# Patient Record
Sex: Female | Born: 1970 | Race: White | Hispanic: No | Smoking: Former smoker
Health system: Southern US, Community
[De-identification: ages and names within clinical notes are randomized; demographics above are authoritative.]

## PROBLEM LIST (undated history)

## (undated) DIAGNOSIS — F32A Depression, unspecified: Secondary | ICD-10-CM

## (undated) DIAGNOSIS — D649 Anemia, unspecified: Secondary | ICD-10-CM

## (undated) DIAGNOSIS — F329 Major depressive disorder, single episode, unspecified: Secondary | ICD-10-CM

## (undated) DIAGNOSIS — M1990 Unspecified osteoarthritis, unspecified site: Secondary | ICD-10-CM

## (undated) DIAGNOSIS — I10 Essential (primary) hypertension: Secondary | ICD-10-CM

## (undated) DIAGNOSIS — I4892 Unspecified atrial flutter: Secondary | ICD-10-CM

## (undated) DIAGNOSIS — F410 Panic disorder [episodic paroxysmal anxiety] without agoraphobia: Secondary | ICD-10-CM

## (undated) HISTORY — PX: NO PAST SURGERIES: SHX2092

---

## 2009-11-20 ENCOUNTER — Emergency Department (HOSPITAL_COMMUNITY): Admission: EM | Admit: 2009-11-20 | Discharge: 2009-11-20 | Payer: Self-pay | Admitting: Emergency Medicine

## 2010-02-18 ENCOUNTER — Emergency Department (HOSPITAL_COMMUNITY): Admission: EM | Admit: 2010-02-18 | Discharge: 2010-02-18 | Payer: Self-pay | Admitting: Emergency Medicine

## 2010-12-13 LAB — POCT PREGNANCY, URINE: Preg Test, Ur: NEGATIVE

## 2014-02-16 ENCOUNTER — Emergency Department (HOSPITAL_COMMUNITY)
Admission: EM | Admit: 2014-02-16 | Discharge: 2014-02-17 | Disposition: A | Discharge status: Home / Self Care | Payer: 59 | Attending: Emergency Medicine | Admitting: Emergency Medicine

## 2014-02-16 ENCOUNTER — Encounter (HOSPITAL_COMMUNITY): Payer: Self-pay | Admitting: Emergency Medicine

## 2014-02-16 DIAGNOSIS — Z79899 Other long term (current) drug therapy: Secondary | ICD-10-CM | POA: Insufficient documentation

## 2014-02-16 DIAGNOSIS — R0602 Shortness of breath: Secondary | ICD-10-CM | POA: Insufficient documentation

## 2014-02-16 DIAGNOSIS — F419 Anxiety disorder, unspecified: Secondary | ICD-10-CM

## 2014-02-16 DIAGNOSIS — D649 Anemia, unspecified: Secondary | ICD-10-CM | POA: Insufficient documentation

## 2014-02-16 DIAGNOSIS — R42 Dizziness and giddiness: Secondary | ICD-10-CM | POA: Insufficient documentation

## 2014-02-16 DIAGNOSIS — F410 Panic disorder [episodic paroxysmal anxiety] without agoraphobia: Secondary | ICD-10-CM | POA: Insufficient documentation

## 2014-02-16 DIAGNOSIS — R072 Precordial pain: Secondary | ICD-10-CM | POA: Insufficient documentation

## 2014-02-16 DIAGNOSIS — F329 Major depressive disorder, single episode, unspecified: Secondary | ICD-10-CM | POA: Insufficient documentation

## 2014-02-16 DIAGNOSIS — F3289 Other specified depressive episodes: Secondary | ICD-10-CM | POA: Insufficient documentation

## 2014-02-16 DIAGNOSIS — R110 Nausea: Secondary | ICD-10-CM | POA: Insufficient documentation

## 2014-02-16 DIAGNOSIS — R079 Chest pain, unspecified: Secondary | ICD-10-CM

## 2014-02-16 HISTORY — DX: Major depressive disorder, single episode, unspecified: F32.9

## 2014-02-16 HISTORY — DX: Depression, unspecified: F32.A

## 2014-02-16 HISTORY — DX: Panic disorder (episodic paroxysmal anxiety): F41.0

## 2014-02-16 HISTORY — DX: Anemia, unspecified: D64.9

## 2014-02-16 LAB — BASIC METABOLIC PANEL
BUN: 18 mg/dL (ref 6–23)
CHLORIDE: 101 meq/L (ref 96–112)
CO2: 23 mEq/L (ref 19–32)
CREATININE: 0.75 mg/dL (ref 0.50–1.10)
Calcium: 9.3 mg/dL (ref 8.4–10.5)
GFR calc Af Amer: >90 mL/min (ref >90)
GLUCOSE: 140 mg/dL — AB (ref 70–99)
POTASSIUM: 3.5 meq/L — AB (ref 3.7–5.3)
SODIUM: 138 meq/L (ref 137–147)

## 2014-02-16 LAB — TROPONIN I: Troponin I: <0.3 ng/mL (ref <0.30)

## 2014-02-16 LAB — CBC WITH DIFFERENTIAL/PLATELET
Basophils Absolute: 0 10*3/uL (ref 0.0–0.1)
Basophils Relative: 0 % (ref 0–1)
EOS ABS: 0.3 10*3/uL (ref 0.0–0.7)
Eosinophils Relative: 3 % (ref 0–5)
HEMATOCRIT: 37.7 % (ref 36.0–46.0)
Hemoglobin: 12.5 g/dL (ref 12.0–15.0)
Lymphocytes Relative: 25 % (ref 12–46)
Lymphs Abs: 2.7 10*3/uL (ref 0.7–4.0)
MCH: 28.1 pg (ref 26.0–34.0)
MCHC: 33.2 g/dL (ref 30.0–36.0)
MCV: 84.7 fL (ref 78.0–100.0)
MONOS PCT: 6 % (ref 3–12)
Monocytes Absolute: 0.6 10*3/uL (ref 0.1–1.0)
NEUTROS ABS: 6.9 10*3/uL (ref 1.7–7.7)
NEUTROS PCT: 66 % (ref 43–77)
Platelets: 270 10*3/uL (ref 150–400)
RBC: 4.45 MIL/uL (ref 3.87–5.11)
RDW: 14.4 % (ref 11.5–15.5)
WBC: 10.4 10*3/uL (ref 4.0–10.5)

## 2014-02-16 LAB — URINALYSIS, ROUTINE W REFLEX MICROSCOPIC
BILIRUBIN URINE: NEGATIVE
GLUCOSE, UA: NEGATIVE mg/dL
Ketones, ur: NEGATIVE mg/dL
Leukocytes, UA: NEGATIVE
Nitrite: NEGATIVE
Protein, ur: NEGATIVE mg/dL
SPECIFIC GRAVITY, URINE: 1.031 — AB (ref 1.005–1.030)
Urobilinogen, UA: 0.2 mg/dL (ref 0.0–1.0)
pH: 5.5 (ref 5.0–8.0)

## 2014-02-16 LAB — URINE MICROSCOPIC-ADD ON

## 2014-02-16 NOTE — ED Provider Notes (Signed)
CSN: [id number]     Arrival date & time 02/16/14  2035 History   First MD Initiated Contact with Patient 02/16/14 2114     Chief Complaint  Patient presents with  . Dizziness  . Chest Pain  . Nausea     (Consider location/radiation/quality/duration/timing/severity/associated sxs/prior Treatment) Patient is a 43 y.o. female presenting with chest pain. The history is provided by the patient. No language interpreter was used.  Chest Pain Pain location:  Substernal area Pain quality: dull and pressure   Pain radiates to:  Upper back Pain radiates to the back: yes   Pain severity:  Mild Onset quality:  Sudden Associated symptoms: dizziness, nausea and shortness of breath   Associated symptoms: no cough, no fever and not vomiting   Associated symptoms comment:  She was at home cleaning when she had onset of chest pressure, squeezing in nature, associated with shortness of breath and lightheadedness. She sat down to rest and symptoms resolved. She has had symptoms of anxiety in the past and reports this felt different. No fever, cough. She has had some nausea intermittently without vomiting.   Past Medical History  Diagnosis Date  . Anemia   . Depression   . Panic attack    History reviewed. No pertinent past surgical history. No family history on file. History  Substance Use Topics  . Smoking status: Never Smoker   . Smokeless tobacco: Not on file  . Alcohol Use: Yes   OB History   Grav Para Term Preterm Abortions TAB SAB Ect Mult Living                 Review of Systems  Constitutional: Negative for fever.  Respiratory: Positive for shortness of breath. Negative for cough.   Cardiovascular: Positive for chest pain.  Gastrointestinal: Positive for nausea. Negative for vomiting.  Musculoskeletal: Negative for myalgias.  Neurological: Positive for dizziness.      Allergies  Codeine and Sulfa antibiotics  Home Medications   Prior to Admission medications    Medication Sig Start Date End Date Taking? Authorizing Provider  etonogestrel (IMPLANON) 68 MG IMPL implant Inject 1 each into the skin once.   Yes Historical Provider, MD  ferrous sulfate 325 (65 FE) MG tablet Take 325 mg by mouth daily with breakfast.   Yes Historical Provider, MD  ibuprofen (ADVIL,MOTRIN) 800 MG tablet Take 800 mg by mouth every 8 (eight) hours as needed for mild pain.   Yes Historical Provider, MD  pseudoephedrine (SUDAFED) 120 MG 12 hr tablet Take 120 mg by mouth once.   Yes Historical Provider, MD  sertraline (ZOLOFT) 50 MG tablet Take 50 mg by mouth daily.   Yes Historical Provider, MD   BP 138/88  Pulse 85  Temp(Src) 98 F (36.7 C) (Oral)  Resp 20  Ht 5\' 2"  (1.575 m)  Wt 260 lb (117.935 kg)  BMI 47.54 kg/m2  SpO2 100% Physical Exam  Constitutional: She is oriented to person, place, and time. She appears well-developed and well-nourished.  HENT:  Head: Normocephalic.  Neck: Normal range of motion. Neck supple.  Cardiovascular: Normal rate and regular rhythm.   Pulmonary/Chest: Effort normal and breath sounds normal.  Abdominal: Soft. Bowel sounds are normal. There is no tenderness. There is no rebound and no guarding.  Musculoskeletal: Normal range of motion.  Neurological: She is alert and oriented to person, place, and time.  Skin: Skin is warm and dry. No rash noted.  Psychiatric: She has a normal mood and affect.  ED Course  Procedures (including critical care time) Labs Review Labs Reviewed  BASIC METABOLIC PANEL - Abnormal; Notable for the following:    Potassium 3.5 (*)    Glucose, Bld 140 (*)    All other components within normal limits  URINALYSIS, ROUTINE W REFLEX MICROSCOPIC - Abnormal; Notable for the following:    Specific Gravity, Urine 1.031 (*)    Hgb urine dipstick LARGE (*)    All other components within normal limits  URINE MICROSCOPIC-ADD ON - Abnormal; Notable for the following:    Squamous Epithelial / LPF FEW (*)    All  other components within normal limits  CBC WITH DIFFERENTIAL  TROPONIN I  TROPONIN I    Imaging Review No results found.   EKG Interpretation None      MDM   Final diagnoses:  None    1. Anxiety  She improves while in ED, currently asymptomatic. Labs and imaging reassuring. Deltra troponin negative. Doubt ACS, likely anxiety with history of the same.    Arnoldo HookerShari A Corrinna Karapetyan, PA-C 02/18/14 [phone]

## 2014-02-16 NOTE — ED Notes (Signed)
Pt states she has been eating a drinking per usual. States that these symptoms are similar to prior anxiety attacks. States that her friend told her she had to come to the ED.

## 2014-02-16 NOTE — ED Notes (Signed)
"  H/o panic attacks, feels similar, but worse", c/o dizziness, chest tightness, nausea, tired x2d, sob at time of onset (sob improved), (denies: fever, vd), alert, NAD, calm, interactive, resps e/u, speaking in clear complete sentences.

## 2014-02-17 ENCOUNTER — Emergency Department (HOSPITAL_COMMUNITY): Payer: 59

## 2014-02-17 NOTE — Discharge Instructions (Signed)
Chest Pain (Nonspecific) °It is often hard to give a specific diagnosis for the cause of chest pain. There is always a chance that your pain could be related to something serious, such as a heart attack or a blood clot in the lungs. You need to follow up with your caregiver for further evaluation. °CAUSES  °· Heartburn. °· Pneumonia or bronchitis. °· Anxiety or stress. °· Inflammation around your heart (pericarditis) or lung (pleuritis or pleurisy). °· A blood clot in the lung. °· A collapsed lung (pneumothorax). It can develop suddenly on its own (spontaneous pneumothorax) or from injury (trauma) to the chest. °· Shingles infection (herpes zoster virus). °The chest wall is composed of bones, muscles, and cartilage. Any of these can be the source of the pain. °· The bones can be bruised by injury. °· The muscles or cartilage can be strained by coughing or overwork. °· The cartilage can be affected by inflammation and become sore (costochondritis). °DIAGNOSIS  °Lab tests or other studies, such as X-rays, electrocardiography, stress testing, or cardiac imaging, may be needed to find the cause of your pain.  °TREATMENT  °· Treatment depends on what may be causing your chest pain. Treatment may include: °· Acid blockers for heartburn. °· Anti-inflammatory medicine. °· Pain medicine for inflammatory conditions. °· Antibiotics if an infection is present. °· You may be advised to change lifestyle habits. This includes stopping smoking and avoiding alcohol, caffeine, and chocolate. °· You may be advised to keep your head raised (elevated) when sleeping. This reduces the chance of acid going backward from your stomach into your esophagus. °· Most of the time, nonspecific chest pain will improve within 2 to 3 days with rest and mild pain medicine. °HOME CARE INSTRUCTIONS  °· If antibiotics were prescribed, take your antibiotics as directed. Finish them even if you start to feel better. °· For the next few days, avoid physical  activities that bring on chest pain. Continue physical activities as directed. °· Do not smoke. °· Avoid drinking alcohol. °· Only take over-the-counter or prescription medicine for pain, discomfort, or fever as directed by your caregiver. °· Follow your caregiver's suggestions for further testing if your chest pain does not go away. °· Keep any follow-up appointments you made. If you do not go to an appointment, you could develop lasting (chronic) problems with pain. If there is any problem keeping an appointment, you must call to reschedule. °SEEK MEDICAL CARE IF:  °· You think you are having problems from the medicine you are taking. Read your medicine instructions carefully. °· Your chest pain does not go away, even after treatment. °· You develop a rash with blisters on your chest. °SEEK IMMEDIATE MEDICAL CARE IF:  °· You have increased chest pain or pain that spreads to your arm, neck, jaw, back, or abdomen. °· You develop shortness of breath, an increasing cough, or you are coughing up blood. °· You have severe back or abdominal pain, feel nauseous, or vomit. °· You develop severe weakness, fainting, or chills. °· You have a fever. °THIS IS AN EMERGENCY. Do not wait to see if the pain will go away. Get medical help at once. Call your local emergency services (911 in [place] Do not drive yourself to the hospital. °MAKE SURE YOU:  °· Understand these instructions. °· Will watch your condition. °· Will get help right away if you are not doing well or get worse. °Document Released: 06/22/2005 Document Revised: 12/05/2011 Document Reviewed: 04/17/2008 °ExitCare® Patient Information ©2014 [employer],   LLC. ° °

## 2014-02-20 NOTE — ED Provider Notes (Signed)
Medical screening examination/treatment/procedure(s) were performed by non-physician practitioner and as supervising physician I was immediately available for consultation/collaboration.   EKG Interpretation   Date/Time:  Sunday Feb 16 2014 20:40:26 EDT Ventricular Rate:  90 PR Interval:  126 QRS Duration: 70 QT Interval:  340 QTC Calculation: 415 R Axis:   42 Text Interpretation:  Normal sinus rhythm Nonspecific T wave abnormality  Abnormal ECG ED PHYSICIAN INTERPRETATION AVAILABLE IN [facility]  Confirmed by TEST, Record ([id number]) on 02/18/2014 7:36:59 AM        Rolan Bucco, MD 02/20/14 1056

## 2014-04-14 ENCOUNTER — Encounter (HOSPITAL_COMMUNITY): Payer: Self-pay | Admitting: Emergency Medicine

## 2014-04-14 ENCOUNTER — Emergency Department (HOSPITAL_COMMUNITY): Payer: 59

## 2014-04-14 ENCOUNTER — Emergency Department (HOSPITAL_COMMUNITY)
Admission: EM | Admit: 2014-04-14 | Discharge: 2014-04-14 | Disposition: A | Discharge status: Home / Self Care | Payer: 59 | Attending: Emergency Medicine | Admitting: Emergency Medicine

## 2014-04-14 DIAGNOSIS — D649 Anemia, unspecified: Secondary | ICD-10-CM | POA: Insufficient documentation

## 2014-04-14 DIAGNOSIS — F329 Major depressive disorder, single episode, unspecified: Secondary | ICD-10-CM | POA: Insufficient documentation

## 2014-04-14 DIAGNOSIS — Z79899 Other long term (current) drug therapy: Secondary | ICD-10-CM | POA: Insufficient documentation

## 2014-04-14 DIAGNOSIS — R0789 Other chest pain: Secondary | ICD-10-CM | POA: Insufficient documentation

## 2014-04-14 DIAGNOSIS — F3289 Other specified depressive episodes: Secondary | ICD-10-CM | POA: Insufficient documentation

## 2014-04-14 DIAGNOSIS — F410 Panic disorder [episodic paroxysmal anxiety] without agoraphobia: Secondary | ICD-10-CM | POA: Insufficient documentation

## 2014-04-14 DIAGNOSIS — R110 Nausea: Secondary | ICD-10-CM | POA: Insufficient documentation

## 2014-04-14 DIAGNOSIS — R42 Dizziness and giddiness: Secondary | ICD-10-CM | POA: Insufficient documentation

## 2014-04-14 DIAGNOSIS — Z3202 Encounter for pregnancy test, result negative: Secondary | ICD-10-CM | POA: Insufficient documentation

## 2014-04-14 LAB — I-STAT CHEM 8, ED
BUN: 9 mg/dL (ref 6–23)
BUN: 9 mg/dL (ref 6–23)
CALCIUM ION: 1.15 mmol/L (ref 1.12–1.23)
CHLORIDE: 104 meq/L (ref 96–112)
CREATININE: 0.7 mg/dL (ref 0.50–1.10)
CREATININE: 0.7 mg/dL (ref 0.50–1.10)
Calcium, Ion: 1.16 mmol/L (ref 1.12–1.23)
Chloride: 103 mEq/L (ref 96–112)
GLUCOSE: 124 mg/dL — AB (ref 70–99)
Glucose, Bld: 115 mg/dL — ABNORMAL HIGH (ref 70–99)
HCT: 41 % (ref 36.0–46.0)
HCT: 42 % (ref 36.0–46.0)
Hemoglobin: 13.9 g/dL (ref 12.0–15.0)
Hemoglobin: 14.3 g/dL (ref 12.0–15.0)
POTASSIUM: 4 meq/L (ref 3.7–5.3)
Potassium: 4 mEq/L (ref 3.7–5.3)
SODIUM: 141 meq/L (ref 137–147)
Sodium: 141 mEq/L (ref 137–147)
TCO2: 24 mmol/L (ref 0–100)
TCO2: 25 mmol/L (ref 0–100)

## 2014-04-14 LAB — URINE MICROSCOPIC-ADD ON

## 2014-04-14 LAB — CBC
HCT: 39.8 % (ref 36.0–46.0)
HEMOGLOBIN: 12.6 g/dL (ref 12.0–15.0)
MCH: 27.6 pg (ref 26.0–34.0)
MCHC: 31.7 g/dL (ref 30.0–36.0)
MCV: 87.3 fL (ref 78.0–100.0)
PLATELETS: 232 10*3/uL (ref 150–400)
RBC: 4.56 MIL/uL (ref 3.87–5.11)
RDW: 14.5 % (ref 11.5–15.5)
WBC: 9.8 10*3/uL (ref 4.0–10.5)

## 2014-04-14 LAB — URINALYSIS, ROUTINE W REFLEX MICROSCOPIC
BILIRUBIN URINE: NEGATIVE
GLUCOSE, UA: NEGATIVE mg/dL
KETONES UR: NEGATIVE mg/dL
Leukocytes, UA: NEGATIVE
NITRITE: NEGATIVE
PROTEIN: NEGATIVE mg/dL
Specific Gravity, Urine: 1.016 (ref 1.005–1.030)
UROBILINOGEN UA: 0.2 mg/dL (ref 0.0–1.0)
pH: 7 (ref 5.0–8.0)

## 2014-04-14 LAB — PREGNANCY, URINE: Preg Test, Ur: NEGATIVE

## 2014-04-14 LAB — I-STAT TROPONIN, ED
TROPONIN I, POC: 0 ng/mL (ref 0.00–0.08)
Troponin i, poc: 0 ng/mL (ref 0.00–0.08)

## 2014-04-14 MED ORDER — SODIUM CHLORIDE 0.9 % IV BOLUS (SEPSIS)
1000.0000 mL | Freq: Once | INTRAVENOUS | Status: AC
  Administered 2014-04-14: 1000 mL via INTRAVENOUS

## 2014-04-14 MED ORDER — LORAZEPAM 1 MG PO TABS: 1.0000 mg | ORAL_TABLET | Freq: Three times a day (TID) | ORAL | Status: DC | PRN

## 2014-04-14 MED ORDER — ONDANSETRON HCL 4 MG/2ML IJ SOLN
4.0000 mg | Freq: Once | INTRAMUSCULAR | Status: AC
  Administered 2014-04-14: 4 mg via INTRAVENOUS
  Filled 2014-04-14: qty 2

## 2014-04-14 MED ORDER — ONDANSETRON HCL 4 MG PO TABS: 4.0000 mg | ORAL_TABLET | Freq: Three times a day (TID) | ORAL | Status: DC | PRN

## 2014-04-14 MED ORDER — LORAZEPAM 2 MG/ML IJ SOLN
1.0000 mg | Freq: Once | INTRAMUSCULAR | Status: AC
  Administered 2014-04-14: 1 mg via INTRAVENOUS
  Filled 2014-04-14: qty 1

## 2014-04-14 NOTE — ED Notes (Signed)
Pt up to bathroom without difficulty. States she feels much better. Vital signs stable.

## 2014-04-14 NOTE — ED Notes (Signed)
Pt presents to department via [facility] for evaluation of nausea and dizziness. Onset this morning after waking up. States she became dizzy, lightheaded and nauseous. Denies pain. Pt is alert and oriented x4. History of anxiety and panic attacks. 22 L hand. Received 4mg  Zofran.

## 2014-04-14 NOTE — ED Provider Notes (Signed)
CSN: [id number]     Arrival date & time 04/14/14  1028 History   First MD Initiated Contact with Patient 04/14/14 1037     Chief Complaint  Patient presents with  . Dizziness  . Nausea     (Consider location/radiation/quality/duration/timing/severity/associated sxs/prior Treatment) HPI Comments: This is a [age] year old female past medical history significant for anemia, depression, panic attacks presented to the emergency department for acute onset of nausea and lightheadedness and dizziness around 9:30 AM this morning. She is endorsing some mild chest tightness that is resolving at this time. Patient states the symptoms feel similar but not as severe to previous anxiety attacks. She did not try any medications at home. Denies any fevers, chills, emesis, abdominal pain, syncope, headache, visual disturbance. No cardiac history. Familial cardiac history is unknown.   Past Medical History  Diagnosis Date  . Anemia   . Depression   . Panic attack    History reviewed. No pertinent past surgical history. No family history on file. History  Substance Use Topics  . Smoking status: Never Smoker   . Smokeless tobacco: Not on file  . Alcohol Use: Yes   OB History   Grav Para Term Preterm Abortions TAB SAB Ect Mult Living                 Review of Systems  Respiratory: Positive for chest tightness.   Cardiovascular: Negative for chest pain.  Gastrointestinal: Positive for nausea. Negative for vomiting and abdominal pain.  Neurological: Positive for dizziness and light-headedness. Negative for syncope, weakness and numbness.  All other systems reviewed and are negative.     Allergies  Codeine and Sulfa antibiotics  Home Medications   Prior to Admission medications   Medication Sig Start Date End Date Taking? Authorizing Provider  etonogestrel (IMPLANON) 68 MG IMPL implant Inject 1 each into the skin once.   Yes Historical Provider, MD  ferrous sulfate 325 (65 FE) MG tablet Take  325 mg by mouth daily with breakfast.   Yes Historical Provider, MD  ibuprofen (ADVIL,MOTRIN) 800 MG tablet Take 800 mg by mouth every 8 (eight) hours as needed for mild pain.   Yes Historical Provider, MD  sertraline (ZOLOFT) 50 MG tablet Take 50 mg by mouth daily.   Yes Historical Provider, MD  traZODone (DESYREL) 50 MG tablet Take 50 mg by mouth at bedtime as needed for sleep.   Yes Historical Provider, MD  LORazepam (ATIVAN) 1 MG tablet Take 1 tablet (1 mg total) by mouth every 8 (eight) hours as needed for anxiety. 04/14/14   Alegra Rost L Sani Loiseau, PA-C  ondansetron (ZOFRAN) 4 MG tablet Take 1 tablet (4 mg total) by mouth every 8 (eight) hours as needed for nausea or vomiting. 04/14/14   Rafik Koppel L Isabell Bonafede, PA-C   BP 130/69  Pulse 77  Temp(Src) 97.6 F (36.4 C) (Oral)  Resp 20  SpO2 100% Physical Exam  Nursing note and vitals reviewed. Constitutional: She is oriented to person, place, and time. She appears well-developed and well-nourished. No distress.  HENT:  Head: Normocephalic and atraumatic.  Right Ear: External ear normal.  Left Ear: External ear normal.  Nose: Nose normal.  Mouth/Throat: Oropharynx is clear and moist. No oropharyngeal exudate.  Eyes: Conjunctivae and EOM are normal. Pupils are equal, round, and reactive to light.  Neck: Normal range of motion. Neck supple.  Cardiovascular: Normal rate, regular rhythm, normal heart sounds and intact distal pulses.   Pulmonary/Chest: Effort normal and breath sounds normal. No respiratory  distress.  Abdominal: Soft. There is no tenderness.  Musculoskeletal: She exhibits no edema.  Neurological: She is alert and oriented to person, place, and time. She has normal strength. No cranial nerve deficit. Gait normal. GCS eye subscore is 4. GCS verbal subscore is 5. GCS motor subscore is 6.  Sensation grossly intact.  No pronator drift.  Bilateral heel-knee-shin intact.  Skin: Skin is warm and dry. She is not diaphoretic.    ED  Course  Procedures (including critical care time) Medications  sodium chloride 0.9 % bolus 1,000 mL (0 mLs Intravenous Stopped 04/14/14 1308)  ondansetron (ZOFRAN) injection 4 mg (4 mg Intravenous Given 04/14/14 1106)  LORazepam (ATIVAN) injection 1 mg (1 mg Intravenous Given 04/14/14 1106)    Labs Review Labs Reviewed  URINALYSIS, ROUTINE W REFLEX MICROSCOPIC - Abnormal; Notable for the following:    Hgb urine dipstick MODERATE (*)    All other components within normal limits  I-STAT CHEM 8, ED - Abnormal; Notable for the following:    Glucose, Bld 124 (*)    All other components within normal limits  I-STAT CHEM 8, ED - Abnormal; Notable for the following:    Glucose, Bld 115 (*)    All other components within normal limits  PREGNANCY, URINE  CBC  URINE MICROSCOPIC-ADD ON  Rosezena Sensor, ED  Rosezena Sensor, ED    Imaging Review Dg Chest 2 View  04/14/2014   CLINICAL DATA:  Tightness.  Dizziness.  EXAM: CHEST  2 VIEW  COMPARISON:  PA and lateral chest 02/18/2014.  FINDINGS: Heart size and mediastinal contours are within normal limits. Both lungs are clear. Visualized skeletal structures are unremarkable.  IMPRESSION: Negative exam.   Electronically Signed   By: Drusilla Kanner M.D.   On: 04/14/2014 11:28     EKG Interpretation None      MDM   Final diagnoses:  Lightheadedness  Nausea    Filed Vitals:   04/14/14 1452  BP: 130/69  Pulse: 77  Temp:   Resp: 20   Afebrile, NAD, non-toxic appearing, AAOx4. I have reviewed nursing notes, vital signs, and all appropriate lab and imaging results for this patient.  Patient presents to the emergency department complaining of symptoms consistent with anxiety.  Patient has a history of same with similar episodes.  The patient is resting comfortably, in no apparent distress and asymptomatic.  Labs, ECG, negative delta troponin and vital signs reviewed.  No exophthalmos, pregnancy test negative, no signs of UTI.  Stress  reducing mechanisms discussed including caffeine intake.  Patient has been referred to PCP for follow-up.  Discharged with a 3 day prescription for Ativan 1mg .  Patient d/w with Dr. Micheline Maze, agrees with plan.         Jeannetta Ellis, PA-C 04/14/14 1531

## 2014-04-14 NOTE — Discharge Instructions (Signed)
Please follow up with your primary care physician in 1-2 days. If you do not have one [facility] listed above. Please read all discharge instructions and return precautions.   Generalized Anxiety Disorder Generalized anxiety disorder (GAD) is a mental disorder. It interferes with life functions, including relationships, work, and school. GAD is different from normal anxiety, which everyone experiences at some point in their lives in response to specific life events and activities. Normal anxiety actually helps us prepare for and get through these life events and activities. Normal anxiety goes away after the event or activity is over.  GAD causes anxiety that is not necessarily related to specific events or activities. It also causes excess anxiety in proportion to specific events or activities. The anxiety associated with GAD is also difficult to control. GAD can vary from mild to severe. People with severe GAD can have intense waves of anxiety with physical symptoms (panic attacks).  SYMPTOMS The anxiety and worry associated with GAD are difficult to control. This anxiety and worry are related to many life events and activities and also occur more days than not for 6 months or longer. People with GAD also have three or more of the following symptoms (one or more in children):  Restlessness.   Fatigue.  Difficulty concentrating.   Irritability.  Muscle tension.  Difficulty sleeping or unsatisfying sleep. DIAGNOSIS GAD is diagnosed through an assessment by your caregiver. Your caregiver will ask you questions aboutyour mood,physical symptoms, and events in your life. Your caregiver may ask you about your medical history and use of alcohol or drugs, including prescription medications. Your caregiver may also do a physical exam and blood tests. Certain medical conditions and the use of certain substances can cause symptoms similar to those associated  with GAD. Your caregiver may refer you to a mental health specialist for further evaluation. TREATMENT The following therapies are usually used to treat GAD:   Medication--Antidepressant medication usually is prescribed for long-term daily control. Antianxiety medications may be added in severe cases, especially when panic attacks occur.   Talk therapy (psychotherapy)--Certain types of talk therapy can be helpful in treating GAD by providing support, education, and guidance. A form of talk therapy called cognitive behavioral therapy can teach you healthy ways to think about and react to daily life events and activities.  Stress managementtechniques--These include yoga, meditation, and exercise and can be very helpful when they are practiced regularly. A mental health specialist can help determine which treatment is best for you. Some people see improvement with one therapy. However, other people require a combination of therapies. Document Released: 01/07/2013 Document Reviewed: 01/07/2013 [facility] Patient Information 2015 [place], [place]. This information is not intended to replace advice given to you by your health care provider. Make sure you discuss any questions you have with your health care provider.  Dizziness Dizziness is a common problem. It is a feeling of unsteadiness or light-headedness. You may feel like you are about to faint. Dizziness can lead to injury if you stumble or fall. A person of any age group can suffer from dizziness, but dizziness is more common in older adults. CAUSES  Dizziness can be caused by many different things, including:  Middle ear problems.  Standing for too long.  Infections.  An allergic reaction.  Aging.  An emotional response to something, such as the sight of blood.  Side effects of medicines.  Tiredness.  Problems with circulation or blood pressure.  Excessive  use of alcohol or medicines, or illegal drug use.  Breathing too fast  (hyperventilation).  An irregular heart rhythm (arrhythmia).  A low red blood cell count (anemia).  Pregnancy.  Vomiting, diarrhea, fever, or other illnesses that cause body fluid loss (dehydration).  Diseases or conditions such as Parkinson's disease, high blood pressure (hypertension), diabetes, and thyroid problems.  Exposure to extreme heat. DIAGNOSIS  Your health care provider will ask about your symptoms, perform a physical exam, and perform an electrocardiogram (ECG) to record the electrical activity of your heart. Your health care provider may also perform other heart or blood tests to determine the cause of your dizziness. These may include:  Transthoracic echocardiogram (TTE). During echocardiography, sound waves are used to evaluate how blood flows through your heart.  Transesophageal echocardiogram (TEE).  Cardiac monitoring. This allows your health care provider to monitor your heart rate and rhythm in real time.  Holter monitor. This is a portable device that records your heartbeat and can help diagnose heart arrhythmias. It allows your health care provider to track your heart activity for several days if needed.  Stress tests by exercise or by giving medicine that makes the heart beat faster. TREATMENT  Treatment of dizziness depends on the cause of your symptoms and can vary greatly. HOME CARE INSTRUCTIONS   Drink enough fluids to keep your urine clear or pale yellow. This is especially important in very hot weather. In older adults, it is also important in cold weather.  Take your medicine exactly as directed if your dizziness is caused by medicines. When taking blood pressure medicines, it is especially important to get up slowly.  Rise slowly from chairs and steady yourself until you feel okay.  In the morning, first sit up on the side of the bed. When you feel okay, stand slowly while holding onto something until you know your balance is fine.  Move your legs  often if you need to stand in one place for a long time. Tighten and relax your muscles in your legs while standing.  Have someone stay with you for 1-2 days if dizziness continues to be a problem. Do this until you feel you are well enough to stay alone. Have the person call your health care provider if he or she notices changes in you that are concerning.  Do not drive or use heavy machinery if you feel dizzy.  Do not drink alcohol. SEEK IMMEDIATE MEDICAL CARE IF:   Your dizziness or light-headedness gets worse.  You feel nauseous or vomit.  You have problems talking, walking, or using your arms, hands, or legs.  You feel weak.  You are not thinking clearly or you have trouble forming sentences. It may take a friend or family member to notice this.  You have chest pain, abdominal pain, shortness of breath, or sweating.  Your vision changes.  You notice any bleeding.  You have side effects from medicine that seems to be getting worse rather than better. MAKE SURE YOU:   Understand these instructions.  Will watch your condition.  Will get help right away if you are not doing well or get worse. Document Released: 03/08/2001 Document Revised: 09/17/2013 Document Reviewed: 04/01/2011 [facility] Patient Information 2015 [place], [place]. This information is not intended to replace advice given to you by your health care provider. Make sure you discuss any questions you have with your health care provider.

## 2014-04-14 NOTE — ED Provider Notes (Signed)
Medical screening examination/treatment/procedure(s) were performed by non-physician practitioner and as supervising physician I was immediately available for consultation/collaboration.   EKG Interpretation   Date/Time:  Monday April 14 2014 10:40:10 EDT Ventricular Rate:  69 PR Interval:  115 QRS Duration: 77 QT Interval:  389 QTC Calculation: 417 R Axis:   50 Text Interpretation:  Sinus rhythm Borderline short PR interval isolated  TWI in III which is unchanged Confirmed by Linzi Ohlinger  MD, Dally Oshel ([id number]) on  04/14/2014 6:56:24 PM        Shanna CiscoMegan E Eryx Zane, MD 04/14/14 [id number]

## 2014-05-19 ENCOUNTER — Ambulatory Visit: Payer: Self-pay

## 2014-05-19 ENCOUNTER — Other Ambulatory Visit: Payer: Self-pay | Admitting: Occupational Medicine

## 2014-05-19 DIAGNOSIS — M79672 Pain in left foot: Secondary | ICD-10-CM

## 2014-06-12 ENCOUNTER — Ambulatory Visit: Payer: 59

## 2014-10-22 ENCOUNTER — Encounter (HOSPITAL_COMMUNITY): Payer: Self-pay | Admitting: *Deleted

## 2014-10-22 ENCOUNTER — Emergency Department (INDEPENDENT_AMBULATORY_CARE_PROVIDER_SITE_OTHER)
Admission: EM | Admit: 2014-10-22 | Discharge: 2014-10-22 | Disposition: A | Discharge status: Home / Self Care | Payer: 59 | Source: Home / Self Care | Attending: Family Medicine | Admitting: Family Medicine

## 2014-10-22 DIAGNOSIS — M62830 Muscle spasm of back: Secondary | ICD-10-CM

## 2014-10-22 MED ORDER — METAXALONE 800 MG PO TABS: 800.0000 mg | ORAL_TABLET | Freq: Three times a day (TID) | ORAL | Status: DC

## 2014-10-22 NOTE — ED Provider Notes (Signed)
CSN: [id number]     Arrival date & time 10/22/14  1754 History   [id number] MD Initiated Contact with Patient 10/22/14 1924     Chief Complaint  Patient presents with  . Neck Pain  . 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 She was mopping for a long time today and started to experience pain in the right side of her upper back and neck. This is constant, worse when she flexes her upper back. The pain occasionally shoots down her spine a couple of inches. She denies any extremity numbness or weakness. She denies any specific injury. No history of pain in this area   Past Medical History  Diagnosis Date  . Anemia   . Depression   . Panic attack    History reviewed. No pertinent past surgical history. Family History  Problem Relation Age of Onset  . Adopted: Yes   History  Substance Use Topics  . Smoking status: Never Smoker   . Smokeless tobacco: Not on file  . Alcohol Use: No   OB History    No data available     Review of Systems  Musculoskeletal: Positive for myalgias and back pain.  All other systems reviewed and are negative.   Allergies  Codeine and Sulfa antibiotics  Home Medications   Prior to Admission medications   Medication Sig Start Date End Date Taking? Authorizing Provider  ibuprofen (ADVIL,MOTRIN) 800 MG tablet Take 800 mg by mouth every 8 (eight) hours as needed for mild pain.   Yes Historical Provider, MD  LORazepam (ATIVAN) 1 MG tablet Take 1 tablet (1 mg total) by mouth every 8 (eight) hours as needed for anxiety. 04/14/14  Yes Jennifer L Piepenbrink, PA-C  pseudoephedrine (SUDAFED) 120 MG 12 hr tablet Take 120 mg by mouth 2 (two) times daily.   Yes Historical Provider, MD  sertraline (ZOLOFT) 50 MG tablet Take 50 mg by mouth daily.   Yes Historical Provider, MD    etonogestrel (IMPLANON) 68 MG IMPL implant Inject 1 each into the skin once.    Historical Provider, MD  ferrous sulfate 325 (65 FE) MG tablet Take 325 mg by mouth daily with breakfast.    Historical Provider, MD  metaxalone (SKELAXIN) 800 MG tablet Take 1 tablet (800 mg total) by mouth 3 (three) times daily. 10/22/14   Adrian BlackwaterZachary H Jary Louvier, PA-C  ondansetron (ZOFRAN) 4 MG tablet Take 1 tablet (4 mg total) by mouth every 8 (eight) hours as needed for nausea or vomiting. 04/14/14   Lise AuerJennifer L Piepenbrink, PA-C  traZODone (DESYREL) 50 MG tablet Take 50 mg by mouth at bedtime as needed for sleep.    Historical Provider, MD   BP 140/79 mmHg  Pulse 80  Temp(Src) 98.4 F (36.9 C) (Oral)  Resp 16  SpO2 100% Physical Exam  Constitutional: She is oriented to person, place, and time. Vital signs are normal. She appears well-developed and well-nourished. No distress.  HENT:  Head: Normocephalic and atraumatic.  Pulmonary/Chest: Effort normal. No respiratory distress.  Musculoskeletal:       Back:  Neurological: She is alert and oriented to person, place, and time. She has normal strength. She exhibits normal muscle tone. Coordination normal.  Skin: Skin is warm and dry. No rash noted. She is not diaphoretic.  Psychiatric: She has a normal mood and affect. Judgment normal.  Nursing note and vitals reviewed.   ED Course  Procedures (including critical care time) Labs Review Labs Reviewed - No data to display  Imaging Review No results found.   MDM   1. Muscle spasm of back    Treat with 800 mg ibuprofen and Skelaxin. Advised to switch up her repetitive motion when performing a repetitive activity such as mopping. Follow-up when necessary  New Prescriptions   METAXALONE (SKELAXIN) 800 MG TABLET    Take 1 tablet (800 mg total) by mouth 3 (three) times daily.       Graylon Good, PA-C 10/22/14 1947

## 2014-10-22 NOTE — Discharge Instructions (Signed)
Back Exercises Back exercises help treat and prevent back injuries. The goal of back exercises is to increase the strength of your abdominal and back muscles and the flexibility of your back. These exercises should be started when you no longer have back pain. Back exercises include:  Pelvic Tilt. Lie on your back with your knees bent. Tilt your pelvis until the lower part of your back is against the floor. Hold this position 5 to 10 sec and repeat 5 to 10 times.  Knee to Chest. Pull first 1 knee up against your chest and hold for 20 to 30 seconds, repeat this with the other knee, and then both knees. This may be done with the other leg straight or bent, whichever feels better.  Sit-Ups or Curl-Ups. Bend your knees 90 degrees. Start with tilting your pelvis, and do a partial, slow sit-up, lifting your trunk only 30 to 45 degrees off the floor. Take at least 2 to 3 seconds for each sit-up. Do not do sit-ups with your knees out straight. If partial sit-ups are difficult, simply do the above but with only tightening your abdominal muscles and holding it as directed.  Hip-Lift. Lie on your back with your knees flexed 90 degrees. Push down with your feet and shoulders as you raise your hips a couple inches off the floor; hold for 10 seconds, repeat 5 to 10 times.  Back arches. Lie on your stomach, propping yourself up on bent elbows. Slowly press on your hands, causing an arch in your low back. Repeat 3 to 5 times. Any initial stiffness and discomfort should lessen with repetition over time.  Shoulder-Lifts. Lie face down with arms beside your body. Keep hips and torso pressed to floor as you slowly lift your head and shoulders off the floor. Do not overdo your exercises, especially in the beginning. Exercises may cause you some mild back discomfort which lasts for a few minutes; however, if the pain is more severe, or lasts for more than 15 minutes, do not continue exercises until you see your caregiver.  Improvement with exercise therapy for back problems is slow.  See your caregivers for assistance with developing a proper back exercise program. Document Released: 10/20/2004 Document Revised: 12/05/2011 Document Reviewed: 07/14/2011 [facility] Patient Information 2015 [place], [place]. This information is not intended to replace advice given to you by your health care provider. Make sure you discuss any questions you have with your health care provider.  Heat Therapy Heat therapy can help ease sore, stiff, injured, and tight muscles and joints. Heat relaxes your muscles, which may help ease your pain.  RISKS AND COMPLICATIONS If you have any of the following conditions, do not use heat therapy unless your health care provider has approved:  Poor circulation.  Healing wounds or scarred skin in the area being treated.  Diabetes, heart disease, or high blood pressure.  Not being able to feel (numbness) the area being treated.  Unusual swelling of the area being treated.  Active infections.  Blood clots.  Cancer.  Inability to communicate pain. This may include young children and people who have problems with their brain function (dementia).  Pregnancy. Heat therapy should only be used on old, pre-existing, or long-lasting (chronic) injuries. Do not use heat therapy on new injuries unless directed by your health care provider. HOW TO USE HEAT THERAPY There are several different kinds of heat therapy, including:  Moist heat pack.  Warm water bath.  Hot water bottle.  Electric heating pad.  Heated gel pack.  Heated wrap.  Electric heating pad. Use the heat therapy method suggested by your health care provider. Follow your health care provider's instructions on when and how to use heat therapy. GENERAL HEAT THERAPY RECOMMENDATIONS  Do not sleep while using heat therapy. Only use heat therapy while you are awake.  Your skin may turn pink while using heat therapy. Do not use  heat therapy if your skin turns red.  Do not use heat therapy if you have new pain.  High heat or long exposure to heat can cause burns. Be careful when using heat therapy to avoid burning your skin.  Do not use heat therapy on areas of your skin that are already irritated, such as with a rash or sunburn. SEEK MEDICAL CARE IF:  You have blisters, redness, swelling, or numbness.  You have new pain.  Your pain is worse. MAKE SURE YOU:  Understand these instructions.  Will watch your condition.  Will get help right away if you are not doing well or get worse. Document Released: 12/05/2011 Document Revised: 01/27/2014 Document Reviewed: 11/05/2013 [facility] Patient Information 2015 [place], [place]. This information is not intended to replace advice given to you by your health care provider. Make sure you discuss any questions you have with your health care provider.  Musculoskeletal Pain Musculoskeletal pain is muscle and boney aches and pains. These pains can occur in any part of the body. Your caregiver may treat you without knowing the cause of the pain. They may treat you if blood or urine tests, X-rays, and other tests were normal.  CAUSES There is often not a definite cause or reason for these pains. These pains may be caused by a type of germ (virus). The discomfort may also come from overuse. Overuse includes working out too hard when your body is not fit. Boney aches also come from weather changes. Bone is sensitive to atmospheric pressure changes. HOME CARE INSTRUCTIONS   Ask when your test results will be ready. Make sure you get your test results.  Only take over-the-counter or prescription medicines for pain, discomfort, or fever as directed by your caregiver. If you were given medications for your condition, do not drive, operate machinery or power tools, or sign legal documents for 24 hours. Do not drink alcohol. Do not take sleeping pills or other medications that may  interfere with treatment.  Continue all activities unless the activities cause more pain. When the pain lessens, slowly resume normal activities. Gradually increase the intensity and duration of the activities or exercise.  During periods of severe pain, bed rest may be helpful. Lay or sit in any position that is comfortable.  Putting ice on the injured area.  Put ice in a bag.  Place a towel between your skin and the bag.  Leave the ice on for 15 to 20 minutes, 3 to 4 times a day.  Follow up with your caregiver for continued problems and no reason can be found for the pain. If the pain becomes worse or does not go away, it may be necessary to repeat tests or do additional testing. Your caregiver may need to look further for a possible cause. SEEK IMMEDIATE MEDICAL CARE IF:  You have pain that is getting worse and is not relieved by medications.  You develop chest pain that is associated with shortness or breath, sweating, feeling sick to your stomach (nauseous), or throw up (vomit).  Your pain becomes localized to the abdomen.  You develop  any new symptoms that seem different or that concern you. MAKE SURE YOU:   Understand these instructions.  Will watch your condition.  Will get help right away if you are not doing well or get worse. Document Released: 09/12/2005 Document Revised: 12/05/2011 Document Reviewed: 05/17/2013 [facility] Patient Information 2015 [place]. This information is not intended to replace advice given to you by your health care provider. Make sure you discuss any questions you have with your health care provider.  Muscle Cramps and Spasms Muscle cramps and spasms occur when a muscle or muscles tighten and you have no control over this tightening (involuntary muscle contraction). They are a common problem and can develop in any muscle. The most common place is in the calf muscles of the leg. Both muscle cramps and muscle spasms are involuntary muscle  contractions, but they also have differences:   Muscle cramps are sporadic and painful. They may last a few seconds to a quarter of an hour. Muscle cramps are often more forceful and last longer than muscle spasms.  Muscle spasms may or may not be painful. They may also last just a few seconds or much longer. CAUSES  It is uncommon for cramps or spasms to be due to a serious underlying problem. In many cases, the cause of cramps or spasms is unknown. Some common causes are:   Overexertion.   Overuse from repetitive motions (doing the same thing over and over).   Remaining in a certain position for a long period of time.   Improper preparation, form, or technique while performing a sport or activity.   Dehydration.   Injury.   Side effects of some medicines.   Abnormally low levels of the salts and ions in your blood (electrolytes), especially potassium and calcium. This could happen if you are taking water pills (diuretics) or you are pregnant.  Some underlying medical problems can make it more likely to develop cramps or spasms. These include, but are not limited to:   Diabetes.   Parkinson disease.   Hormone disorders, such as thyroid problems.   Alcohol abuse.   Diseases specific to muscles, joints, and bones.   Blood vessel disease where not enough blood is getting to the muscles.  HOME CARE INSTRUCTIONS   Stay well hydrated. Drink enough water and fluids to keep your urine clear or pale yellow.  It may be helpful to massage, stretch, and relax the affected muscle.  For tight or tense muscles, use a warm towel, heating pad, or hot shower water directed to the affected area.  If you are sore or have pain after a cramp or spasm, applying ice to the affected area may relieve discomfort.  Put ice in a plastic bag.  Place a towel between your skin and the bag.  Leave the ice on for 15-20 minutes, 03-04 times a day.  Medicines used to treat a known  cause of cramps or spasms may help reduce their frequency or severity. Only take over-the-counter or prescription medicines as directed by your caregiver. SEEK MEDICAL CARE IF:  Your cramps or spasms get more severe, more frequent, or do not improve over time.  MAKE SURE YOU:   Understand these instructions.  Will watch your condition.  Will get help right away if you are not doing well or get worse. Document Released: 03/04/2002 Document Revised: 01/07/2013 Document Reviewed: 08/29/2012 [facility] Patient Information 2015 [place], [place]. This information is not intended to replace advice given to you by your health care  provider. Make sure you discuss any questions you have with your health care provider. ° °

## 2014-10-22 NOTE — ED Notes (Signed)
C/o R nec pain onset at work while [employer] tonight.  Pain goes from R side of neck down inner aspect of her shoulder blade.

## 2015-07-01 ENCOUNTER — Encounter (HOSPITAL_COMMUNITY): Payer: Self-pay

## 2015-07-01 ENCOUNTER — Inpatient Hospital Stay (HOSPITAL_COMMUNITY)
Admission: AD | Admit: 2015-07-01 | Discharge: 2015-07-01 | Disposition: A | Discharge status: Home / Self Care | Payer: 59 | Source: Ambulatory Visit | Attending: Obstetrics and Gynecology | Admitting: Obstetrics and Gynecology

## 2015-07-01 DIAGNOSIS — Z975 Presence of (intrauterine) contraceptive device: Secondary | ICD-10-CM | POA: Diagnosis not present

## 2015-07-01 DIAGNOSIS — N921 Excessive and frequent menstruation with irregular cycle: Secondary | ICD-10-CM | POA: Diagnosis not present

## 2015-07-01 DIAGNOSIS — N939 Abnormal uterine and vaginal bleeding, unspecified: Secondary | ICD-10-CM | POA: Insufficient documentation

## 2015-07-01 DIAGNOSIS — D500 Iron deficiency anemia secondary to blood loss (chronic): Secondary | ICD-10-CM | POA: Diagnosis not present

## 2015-07-01 LAB — URINE MICROSCOPIC-ADD ON

## 2015-07-01 LAB — CBC
HCT: 33.1 % — ABNORMAL LOW (ref 36.0–46.0)
HEMOGLOBIN: 11.2 g/dL — AB (ref 12.0–15.0)
MCH: 29.4 pg (ref 26.0–34.0)
MCHC: 33.8 g/dL (ref 30.0–36.0)
MCV: 86.9 fL (ref 78.0–100.0)
Platelets: 279 10*3/uL (ref 150–400)
RBC: 3.81 MIL/uL — ABNORMAL LOW (ref 3.87–5.11)
RDW: 14.5 % (ref 11.5–15.5)
WBC: 10.9 10*3/uL — ABNORMAL HIGH (ref 4.0–10.5)

## 2015-07-01 LAB — URINALYSIS, ROUTINE W REFLEX MICROSCOPIC
Bilirubin Urine: NEGATIVE
GLUCOSE, UA: NEGATIVE mg/dL
Ketones, ur: NEGATIVE mg/dL
LEUKOCYTES UA: NEGATIVE
Nitrite: NEGATIVE
PH: 6 (ref 5.0–8.0)
PROTEIN: NEGATIVE mg/dL
SPECIFIC GRAVITY, URINE: 1.02 (ref 1.005–1.030)
Urobilinogen, UA: 0.2 mg/dL (ref 0.0–1.0)

## 2015-07-01 LAB — POCT PREGNANCY, URINE: Preg Test, Ur: NEGATIVE

## 2015-07-01 MED ORDER — NORGESTIMATE-ETH ESTRADIOL 0.25-35 MG-MCG PO TABS: 1.0000 | ORAL_TABLET | Freq: Every day | ORAL | Status: DC

## 2015-07-01 NOTE — MAU Provider Note (Signed)
History     CSN: [id number]  Arrival date and time: 07/01/15 1017   None     Chief Complaint  Patient presents with  . Vaginal Bleeding   HPI   Ms.Debbie Lee is a 44 y.o. female [phone] non pregnant presenting with heavy vaginal bleeding.  She started bleeding 1 week ago; the bleeding varies from moderate to heavy.  She [facility] In the last 24-48 hours the bleeding has become heavier, she is changing several pads and tampons each day.   Denies smoking  Denies history of blood clot.   OB History    Gravida Para Term Preterm AB TAB SAB Ectopic Multiple Living   Past Medical History  Diagnosis Date  . Anemia   . Depression   . Panic attack     Past Surgical History  Procedure Laterality Date  . No past surgeries      Family History  Problem Relation Age of Onset  . Adopted: Yes    Social History  Substance Use Topics  . Smoking status: Never Smoker   . Smokeless tobacco: None  . Alcohol Use: Yes     Comment: 4/year    Allergies:  Allergies  Allergen Reactions  . Codeine Other (See Comments)    "Hallucinations. Narcotics cause vomiting."  . Sulfa Antibiotics Other (See Comments)    "lifelong allergy"    Prescriptions prior to admission  Medication Sig Dispense Refill Last Dose  . etonogestrel (IMPLANON) 68 MG IMPL implant Inject 1 each into the skin once.   2014  . ferrous sulfate 325 (65 FE) MG tablet Take 325 mg by mouth daily with breakfast.   Past Week at Unknown time  . ibuprofen (ADVIL,MOTRIN) 800 MG tablet Take 800 mg by mouth every 8 (eight) hours as needed for mild pain.   10/22/2014 at 1000 time  . LORazepam (ATIVAN) 1 MG tablet Take 1 tablet (1 mg total) by mouth every 8 (eight) hours as needed for anxiety. 9 tablet 0 10/21/2014 at Unknown time  . metaxalone (SKELAXIN) 800 MG tablet Take 1 tablet (800 mg total) by mouth 3 (three) times daily. 21 tablet 0   .  ondansetron (ZOFRAN) 4 MG tablet Take 1 tablet (4 mg total) by mouth every 8 (eight) hours as needed for nausea or vomiting. 10 tablet 0   . pseudoephedrine (SUDAFED) 120 MG 12 hr tablet Take 120 mg by mouth 2 (two) times daily.   10/22/2014 at Unknown time  . sertraline (ZOLOFT) 50 MG tablet Take 50 mg by mouth daily.   10/22/2014 at Unknown time  . traZODone (DESYREL) 50 MG tablet Take 50 mg by mouth at bedtime as needed for sleep.   04/13/2014 at Unknown time   Results for orders placed or performed during the hospital encounter of 07/01/15 (from the past 24 hour(s))  Pregnancy, urine POC     Status: None   Collection Time: 07/01/15 10:32 AM  Result Value Ref Range   Preg Test, Ur NEGATIVE NEGATIVE  Urinalysis, Routine w reflex microscopic (not at [facility])     Status: Abnormal   Collection Time: 07/01/15 10:34 AM  Result Value Ref Range   Color, Urine YELLOW YELLOW   APPearance CLEAR CLEAR   Specific Gravity, Urine 1.020 1.005 - 1.030   pH 6.0 5.0 - 8.0   Glucose, UA NEGATIVE NEGATIVE  mg/dL   Hgb urine dipstick LARGE (A) NEGATIVE   Bilirubin Urine NEGATIVE NEGATIVE   Ketones, ur NEGATIVE NEGATIVE mg/dL   Protein, ur NEGATIVE NEGATIVE mg/dL   Urobilinogen, UA 0.2 0.0 - 1.0 mg/dL   Nitrite NEGATIVE NEGATIVE   Leukocytes, UA NEGATIVE NEGATIVE  Urine microscopic-add on     Status: None   Collection Time: 07/01/15 10:34 AM  Result Value Ref Range   Squamous Epithelial / LPF RARE RARE   RBC / HPF TOO NUMEROUS TO COUNT <3 RBC/hpf  CBC     Status: Abnormal   Collection Time: 07/01/15 11:41 AM  Result Value Ref Range   WBC 10.9 (H) 4.0 - 10.5 K/uL   RBC 3.81 (L) 3.87 - 5.11 MIL/uL   Hemoglobin 11.2 (L) 12.0 - 15.0 g/dL   HCT 16.1 (L) 09.6 - 04.5 %   MCV 86.9 78.0 - 100.0 fL   MCH 29.4 26.0 - 34.0 pg   MCHC 33.8 30.0 - 36.0 g/dL   RDW 40.9 81.1 - 91.4 %   Platelets 279 150 - 400 K/uL    Review of Systems  Constitutional: Positive for malaise/fatigue.  Gastrointestinal: Positive for  abdominal pain.  Neurological: Positive for dizziness (Mild ).   Physical Exam   Blood pressure 127/70, pulse 71, temperature 98.5 F (36.9 C), temperature source Oral, resp. rate 16, height  (1.575 m), weight 116.631 kg (257 lb 2 oz).  Physical Exam  Constitutional: She is oriented to person, place, and time. She appears well-developed and well-nourished. No distress.  GI: Soft.  Genitourinary:  Speculum exam: Vagina - Small amount of dark red blood in the vagina, few small clots  Cervix - + active bleeding  Bimanual exam: Cervix closed, no CMT  Uterus non tender, normal size Adnexa non tender, no masses bilaterally Chaperone present for exam.  Musculoskeletal: Normal range of motion.  Neurological: She is alert and oriented to person, place, and time.  Skin: Skin is warm and dry. Rash noted. She is not diaphoretic.  Psychiatric: Her behavior is normal.    MAU Course  Procedures  None   MDM  CBC stable. [facility]   Assessment and Plan   A:  1. Breakthrough bleeding on Nexplanon   2. Iron deficiency anemia due to chronic blood loss    P:  Discharge home in stable condition Bleeding precautions Return to MAU if symptoms worsen RX: sprintec  [facility].    Duane Lope, NP 07/01/2015 2:43 PM

## 2015-07-01 NOTE — Discharge Instructions (Signed)
Abnormal Uterine Bleeding °Abnormal uterine bleeding can affect women at various stages in life, including teenagers, women in their reproductive years, pregnant women, and women who have reached menopause. Several kinds of uterine bleeding are considered abnormal, including: °· Bleeding or spotting between periods.   °· Bleeding after sexual intercourse.   °· Bleeding that is heavier or more than normal.   °· Periods that last longer than usual. °· Bleeding after menopause.   °Many cases of abnormal uterine bleeding are minor and simple to treat, while others are more serious. Any type of abnormal bleeding should be evaluated by your health care provider. Treatment will depend on the cause of the bleeding. °HOME CARE INSTRUCTIONS °Monitor your condition for any changes. The following actions may help to alleviate any discomfort you are experiencing: °· Avoid the use of tampons and douches as directed by your health care provider. °· Change your pads frequently. °You should get regular pelvic exams and Pap tests. Keep all follow-up appointments for diagnostic tests as directed by your health care provider.  °SEEK MEDICAL CARE IF:  °· Your bleeding lasts more than 1 week.   °· You feel dizzy at times.   °SEEK IMMEDIATE MEDICAL CARE IF:  °· You pass out.   °· You are changing pads every 15 to 30 minutes.   °· You have abdominal pain. °· You have a fever.   °· You become sweaty or weak.   °· You are passing large blood clots from the vagina.   °· You start to feel nauseous and vomit. °MAKE SURE YOU:  °· Understand these instructions. °· Will watch your condition. °· Will get help right away if you are not doing well or get worse. °  °This information is not intended to replace advice given to you by your health care provider. Make sure you discuss any questions you have with your health care provider. °  °Document Released: 09/12/2005 Document Revised: 09/17/2013 Document Reviewed: 04/11/2013 °Elsevier Interactive  Patient Education ©2016 [employer] ° ° °Contraception Choices °Birth control (contraception) is the use of any methods or devices to stop pregnancy from happening. Below are some methods to help avoid pregnancy. °HORMONAL BIRTH CONTROL °· A small tube put under the skin of the upper arm (implant). The tube can stay in place for 3 years. The implant must be taken out after 3 years. °· Shots given every 3 months. °· Pills taken every day. °· Patches that are changed once a week. °· A ring put into the vagina (vaginal ring). The ring is left in place for 3 weeks and removed for 1 week. Then, a new ring is put in the vagina. °· Emergency birth control pills taken after unprotected sex (intercourse). °BARRIER BIRTH CONTROL  °· A thin covering worn on the penis (female condom) during sex. °· A soft, loose covering put into the vagina (female condom) before sex. °· A rubber bowl that sits over the cervix (diaphragm). The bowl must be made for you. The bowl is put into the vagina before sex. The bowl is left in place for 6 to 8 hours after sex. °· A small, soft cup that fits over the cervix (cervical cap). The cup must be made for you. The cup can be left in place for 48 hours after sex. °· A sponge that is put into the vagina before sex. °· A chemical that kills or stops sperm from getting into the cervix and uterus (spermicide). The chemical may be a cream, jelly, foam, or pill. °INTRAUTERINE (IUD) BIRTH CONTROL  °·   birth control is a small, T-shaped piece of plastic. The plastic is put inside the uterus. There are 2 types of IUD:  Copper IUD. The IUD is covered in copper wire. The copper makes a fluid that kills sperm. It can stay in place for 10 years.  Hormone IUD. The hormone stops pregnancy from happening. It can stay in place for 5 years. PERMANENT METHODS  When the woman has her fallopian tubes sealed, tied, or blocked during surgery. This stops the egg from traveling to the uterus.  The doctor places a  small coil or insert into each fallopian tube. This causes scar tissue to form and blocks the fallopian tubes.  When the female has the tubes that carry sperm tied off (vasectomy). NATURAL FAMILY PLANNING BIRTH CONTROL   Natural family planning means not having sex or using barrier birth control on the days the woman could become pregnant.  Use a calendar to keep track of the length of each period and know the days she can get pregnant.  Avoid sex during ovulation.  Use a thermometer to measure body temperature. Also watch for symptoms of ovulation.  Time sex to be after the woman has ovulated. Use condoms to help protect yourself against sexually transmitted infections (STIs). Do this no matter what type of birth control you use. Talk to your doctor about which type of birth control is best for you.   This information is not intended to replace advice given to you by your health care provider. Make sure you discuss any questions you have with your health care provider.   Document Released: 07/10/2009 Document Revised: 09/17/2013 Document Reviewed: 04/03/2013 Elsevier Interactive Patient Education [employer]

## 2015-07-01 NOTE — MAU Note (Addendum)
Patient presents with vaginal bleeding and cramping starting 1 1/2 weeks ago. Patient states she changes her tampon every hour and is also passing clots. Hasn't had a period in 2 1/2 years due to implant.

## 2015-09-08 IMAGING — CR DG CHEST 2V
2 series · 2 of 2 positions shown · non-contrast
Comparison: PA and lateral chest 02/18/2014.

CLINICAL DATA: Tightness.  Dizziness.

EXAM:
CHEST  2 VIEW

[w chest pa]
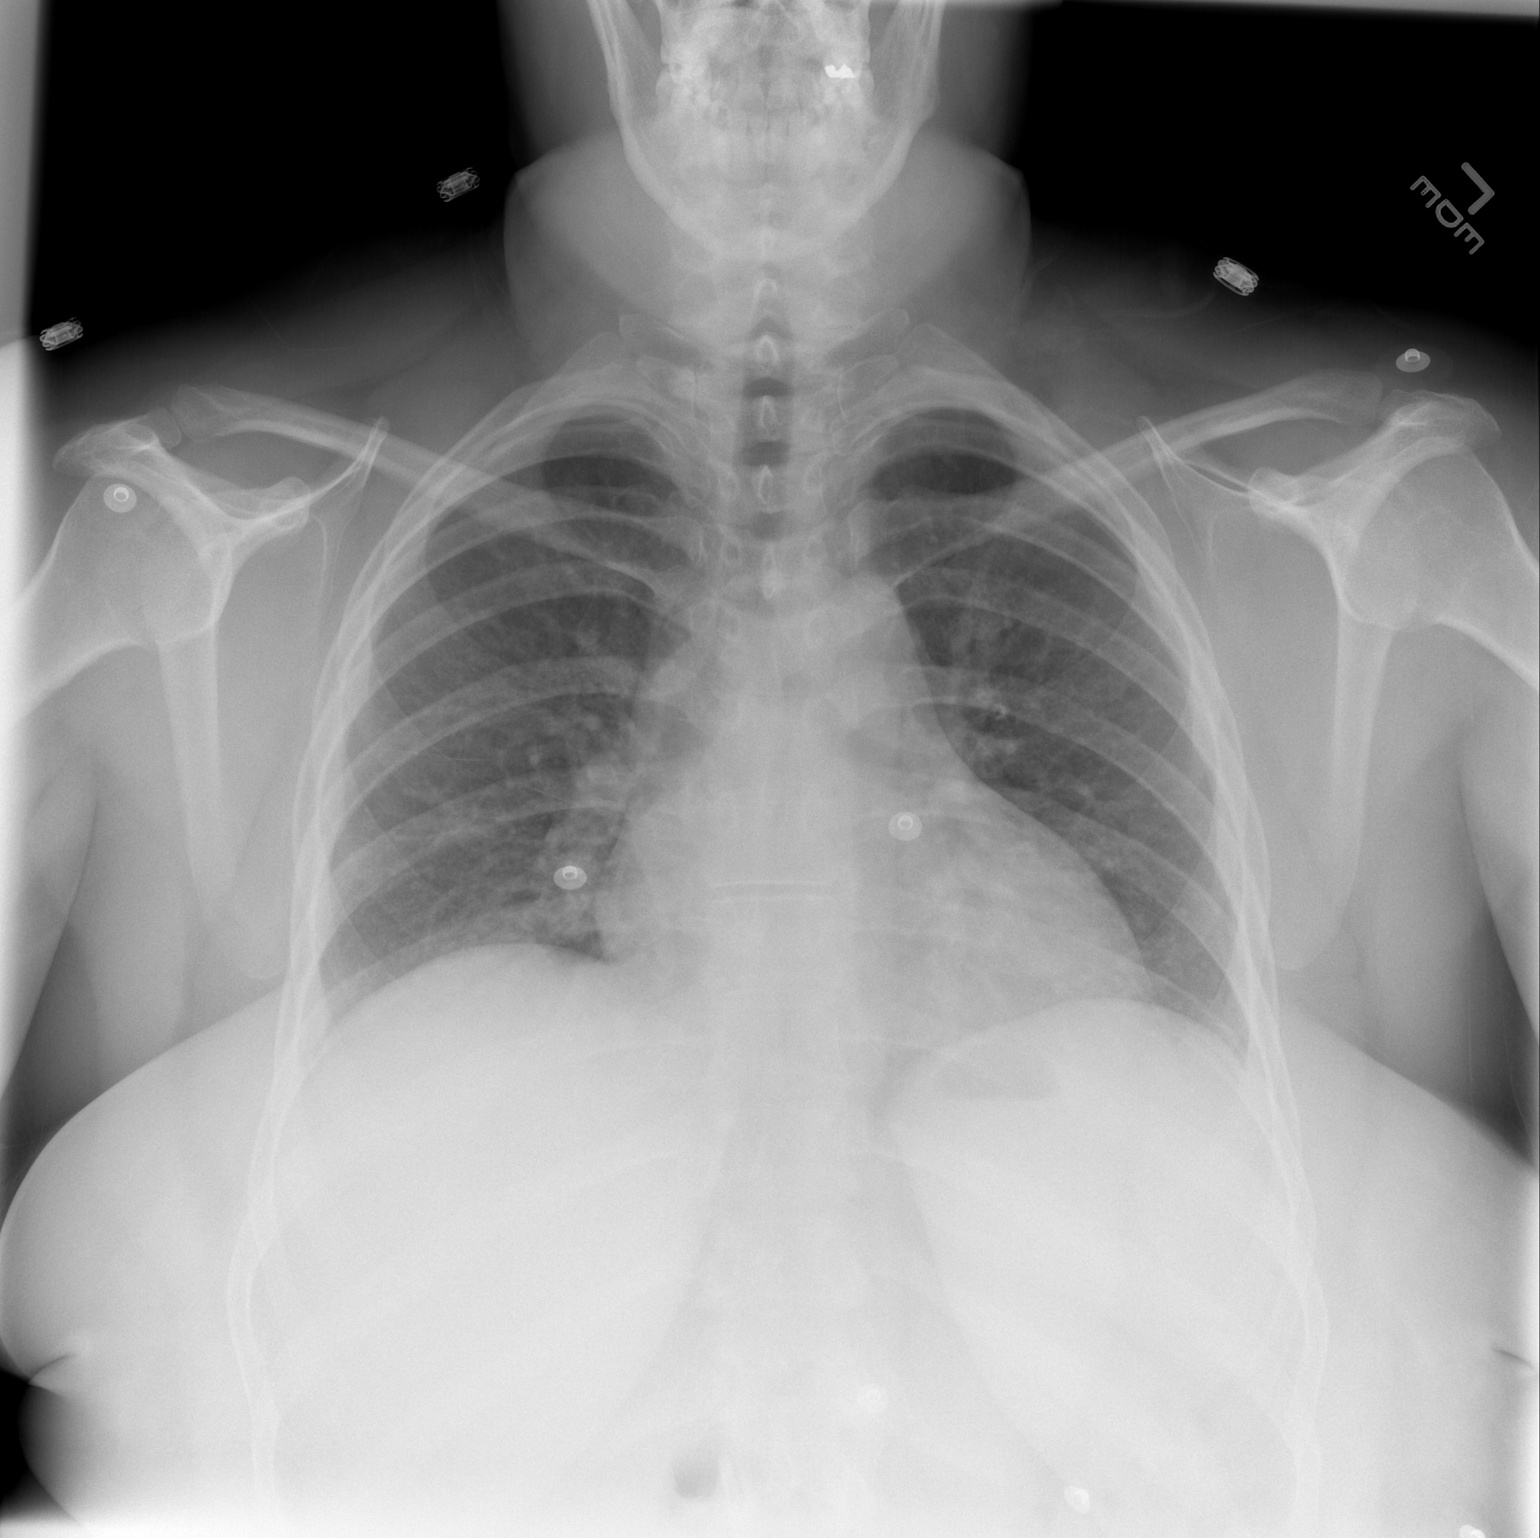

[w chest lat]
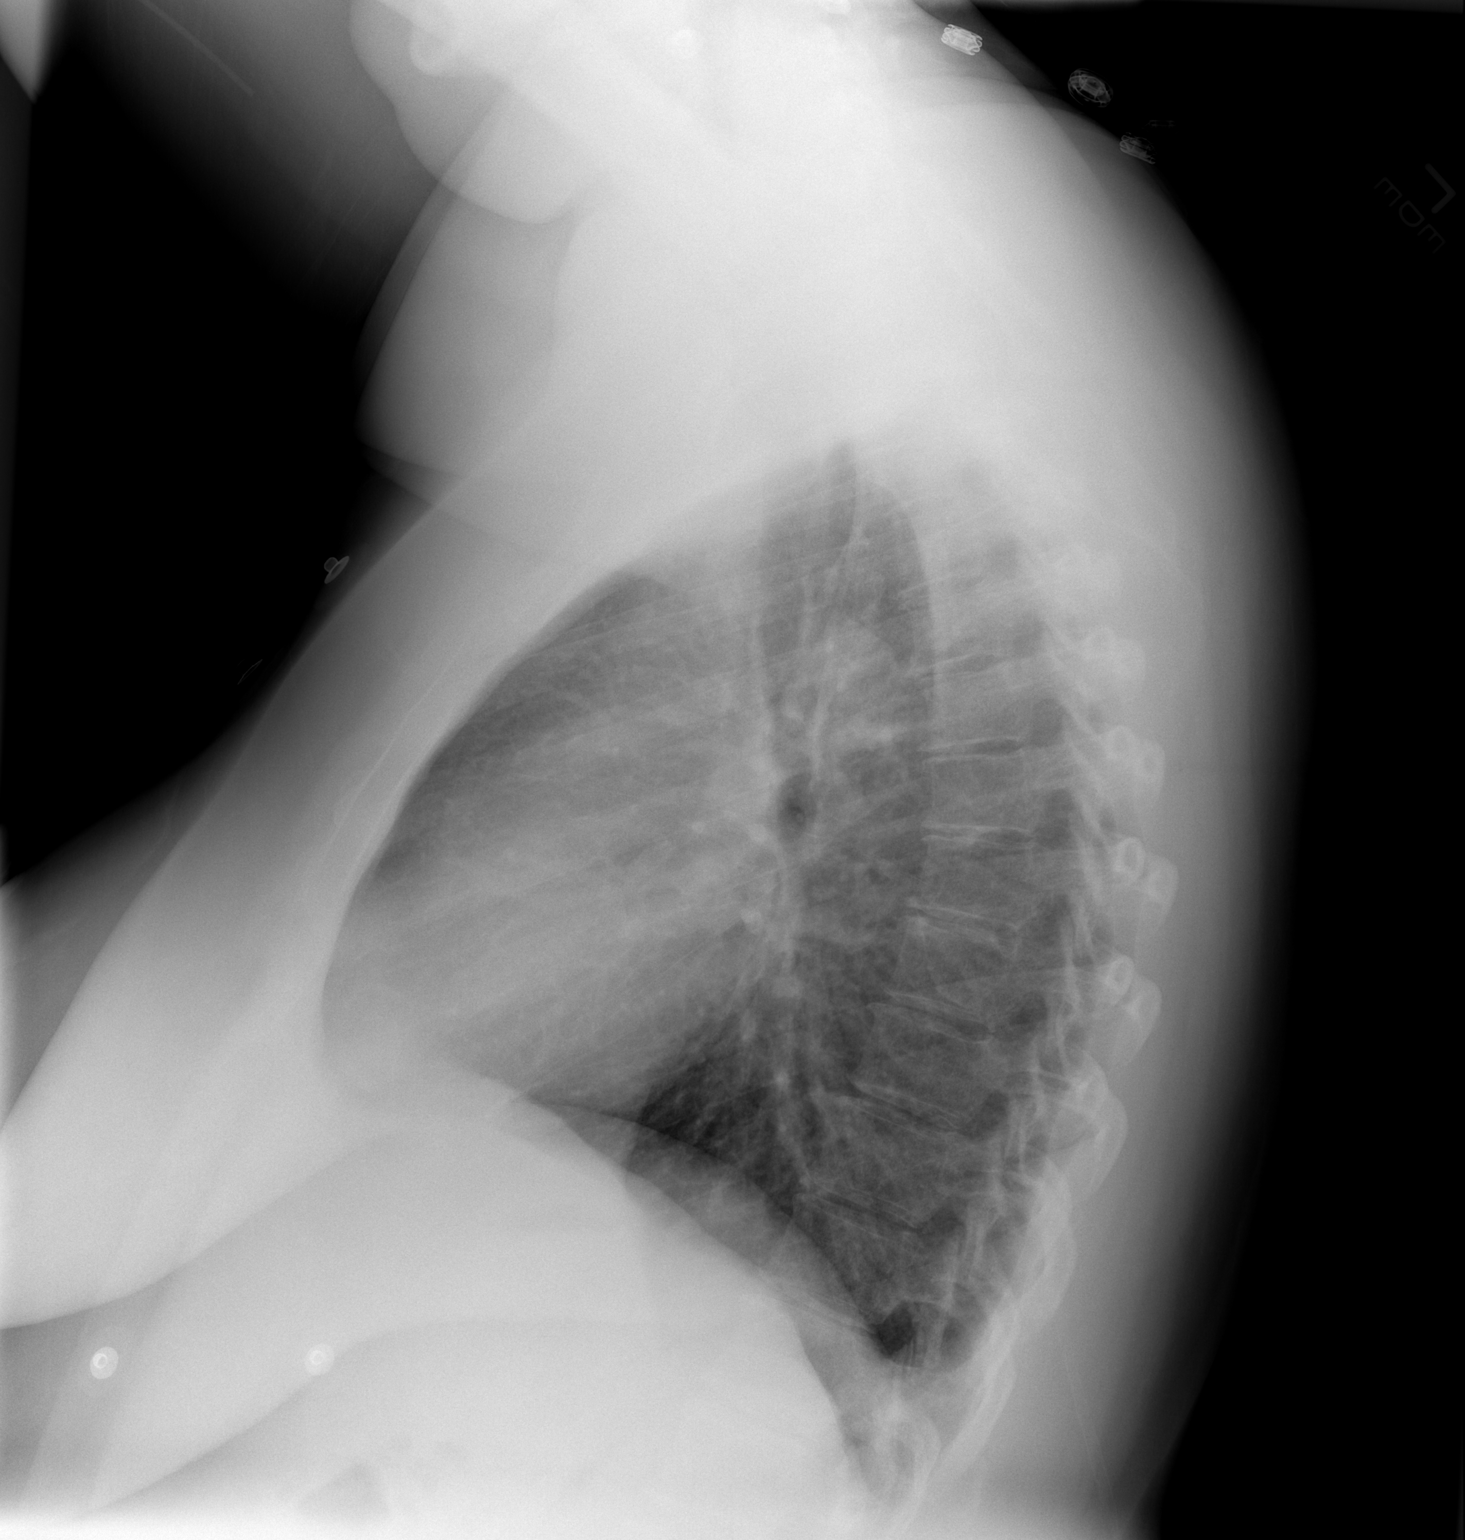

[2 of 2 positions shown; findings below may reference images not displayed]

FINDINGS: Heart size and mediastinal contours are within normal limits. Both
lungs are clear. Visualized skeletal structures are unremarkable.
IMPRESSION: Negative exam.

## 2015-10-08 DIAGNOSIS — M79641 Pain in right hand: Secondary | ICD-10-CM | POA: Diagnosis not present

## 2015-10-08 DIAGNOSIS — M791 Myalgia: Secondary | ICD-10-CM | POA: Diagnosis not present

## 2015-10-08 DIAGNOSIS — M2550 Pain in unspecified joint: Secondary | ICD-10-CM | POA: Diagnosis not present

## 2015-10-08 DIAGNOSIS — R5383 Other fatigue: Secondary | ICD-10-CM | POA: Diagnosis not present

## 2015-10-08 DIAGNOSIS — M79642 Pain in left hand: Secondary | ICD-10-CM | POA: Diagnosis not present

## 2015-10-08 MED FILL — MELOXICAM 15 MG TABLET: 15 | 30 days supply | Qty: 30 | Fill #0

## 2015-11-05 MED FILL — MELOXICAM 15 MG TABLET: 15 | 30 days supply | Qty: 30 | Fill #1

## 2015-11-05 MED FILL — SERTRALINE HCL 100 MG TAB: 100 | 30 days supply | Qty: 45 | Fill #2

## 2015-11-05 MED FILL — AMITRIPTYLINE HCL 10 MG TAB: 10 | 30 days supply | Qty: 30 | Fill #0

## 2015-12-09 MED FILL — AMITRIPTYLINE HCL 10 MG TAB: 10 | 30 days supply | Qty: 30 | Fill #1

## 2015-12-09 MED FILL — MELOXICAM 15 MG TABLET: 15 | 30 days supply | Qty: 30 | Fill #2

## 2015-12-17 DIAGNOSIS — F5104 Psychophysiologic insomnia: Secondary | ICD-10-CM | POA: Diagnosis not present

## 2015-12-17 DIAGNOSIS — R0683 Snoring: Secondary | ICD-10-CM | POA: Diagnosis not present

## 2015-12-17 DIAGNOSIS — R51 Headache: Secondary | ICD-10-CM | POA: Diagnosis not present

## 2015-12-31 MED FILL — traZODone HCL 50 MG TABS: 50 | 30 days supply | Qty: 60 | Fill #0

## 2015-12-31 MED FILL — IBUPROFEN 800 MG TABLET: 800 | 30 days supply | Qty: 60 | Fill #0

## 2016-02-01 MED FILL — IBUPROFEN 800 MG TABLET: 800 | 30 days supply | Qty: 60 | Fill #1

## 2016-02-02 MED FILL — SERTRALINE HCL 100 MG TAB: 100 | 30 days supply | Qty: 45 | Fill #0

## 2019-10-28 ENCOUNTER — Encounter (HOSPITAL_COMMUNITY): Payer: Self-pay | Admitting: Orthopedic Surgery

## 2019-10-28 ENCOUNTER — Other Ambulatory Visit: Payer: Self-pay

## 2019-10-28 ENCOUNTER — Emergency Department (HOSPITAL_COMMUNITY): Payer: Self-pay

## 2019-10-28 ENCOUNTER — Emergency Department (HOSPITAL_COMMUNITY)
Admission: EM | Admit: 2019-10-28 | Discharge: 2019-10-28 | Disposition: A | Discharge status: Home / Self Care | Payer: Self-pay | Attending: Emergency Medicine | Admitting: Emergency Medicine

## 2019-10-28 ENCOUNTER — Encounter (HOSPITAL_COMMUNITY): Payer: Self-pay | Admitting: Emergency Medicine

## 2019-10-28 DIAGNOSIS — Z79899 Other long term (current) drug therapy: Secondary | ICD-10-CM | POA: Insufficient documentation

## 2019-10-28 DIAGNOSIS — Y999 Unspecified external cause status: Secondary | ICD-10-CM | POA: Insufficient documentation

## 2019-10-28 DIAGNOSIS — Y92008 Other place in unspecified non-institutional (private) residence as the place of occurrence of the external cause: Secondary | ICD-10-CM | POA: Insufficient documentation

## 2019-10-28 DIAGNOSIS — S82891A Other fracture of right lower leg, initial encounter for closed fracture: Secondary | ICD-10-CM | POA: Insufficient documentation

## 2019-10-28 DIAGNOSIS — Z20822 Contact with and (suspected) exposure to covid-19: Secondary | ICD-10-CM | POA: Insufficient documentation

## 2019-10-28 DIAGNOSIS — W000XXA Fall on same level due to ice and snow, initial encounter: Secondary | ICD-10-CM | POA: Insufficient documentation

## 2019-10-28 DIAGNOSIS — Y9389 Activity, other specified: Secondary | ICD-10-CM | POA: Insufficient documentation

## 2019-10-28 LAB — SARS CORONAVIRUS 2 (TAT 6-24 HRS): SARS Coronavirus 2: NEGATIVE

## 2019-10-28 MED ORDER — ONDANSETRON 4 MG PO TBDP: 4.0000 mg | ORAL_TABLET | Freq: Three times a day (TID) | ORAL | 0 refills | Status: DC | PRN

## 2019-10-28 MED ORDER — HYDROCODONE-ACETAMINOPHEN 5-325 MG PO TABS: 1.0000 | ORAL_TABLET | ORAL | 0 refills | Status: DC | PRN

## 2019-10-28 MED ORDER — ONDANSETRON HCL 4 MG/2ML IJ SOLN
4.0000 mg | Freq: Once | INTRAMUSCULAR | Status: AC
  Administered 2019-10-28: 4 mg via INTRAVENOUS
  Filled 2019-10-28: qty 2

## 2019-10-28 MED ORDER — SODIUM CHLORIDE 0.9 % IV BOLUS
500.0000 mL | Freq: Once | INTRAVENOUS | Status: AC
  Administered 2019-10-28: 500 mL via INTRAVENOUS

## 2019-10-28 MED ORDER — HYDROCODONE-ACETAMINOPHEN 5-325 MG PO TABS
1.0000 | ORAL_TABLET | Freq: Once | ORAL | Status: AC
  Administered 2019-10-28: 1 via ORAL
  Filled 2019-10-28: qty 1

## 2019-10-28 MED ORDER — PROPOFOL 10 MG/ML IV BOLUS
0.5000 mg/kg | Freq: Once | INTRAVENOUS | Status: DC
  Filled 2019-10-28: qty 20

## 2019-10-28 MED ORDER — PROPOFOL 1000 MG/100ML IV EMUL
INTRAVENOUS | Status: AC | PRN
  Administered 2019-10-28: 70 mg via INTRAVENOUS

## 2019-10-28 MED ORDER — HYDROMORPHONE HCL 1 MG/ML IJ SOLN
1.0000 mg | Freq: Once | INTRAMUSCULAR | Status: AC
  Administered 2019-10-28: 1 mg via INTRAVENOUS
  Filled 2019-10-28: qty 1

## 2019-10-28 NOTE — ED Provider Notes (Signed)
.  Sedation  Date/Time: 10/28/2019 2:27 PM Performed by: Gwyneth Sprout, MD Authorized by: Gwyneth Sprout, MD   Consent:    Consent obtained:  Verbal   Consent given by:  Patient   Risks discussed:  Allergic reaction, dysrhythmia, inadequate sedation, nausea, prolonged hypoxia resulting in organ damage, prolonged sedation necessitating reversal, respiratory compromise necessitating ventilatory assistance and intubation and vomiting   Alternatives discussed:  Analgesia without sedation, anxiolysis and regional anesthesia Universal protocol:    Procedure explained and questions answered to patient or proxy's satisfaction: yes     Relevant documents present and verified: yes     Test results available and properly labeled: yes     Imaging studies available: yes     Required blood products, implants, devices, and special equipment available: yes     Site/side marked: yes     Immediately prior to procedure a time out was called: yes     Patient identity confirmation method:  Verbally with patient Indications:    Procedure necessitating sedation performed by:  Physician performing sedation Pre-sedation assessment:    Time since last food or drink:  3 hours prior to procedure   ASA classification: class 1 - normal, healthy patient     Neck mobility: normal     Mouth opening:  3 or more finger widths   Thyromental distance:  4 finger widths   Mallampati score:  I - soft palate, uvula, fauces, pillars visible   Pre-sedation assessments completed and reviewed: airway patency, cardiovascular function, hydration status, mental status, nausea/vomiting, pain level, respiratory function and temperature     Pre-sedation assessment completed:  10/28/2019 10:27 AM Immediate pre-procedure details:    Reassessment: Patient reassessed immediately prior to procedure     Reviewed: vital signs, relevant labs/tests and NPO status     Verified: bag valve mask available, emergency equipment available,  intubation equipment available, IV patency confirmed, oxygen available and suction available   Procedure details (see MAR for exact dosages):    Preoxygenation:  Nasal cannula   Sedation:  Propofol   Intended level of sedation: deep   Analgesia:  Hydromorphone   Intra-procedure monitoring:  Blood pressure monitoring, cardiac monitor, continuous pulse oximetry, frequent LOC assessments, frequent vital sign checks and continuous capnometry   Intra-procedure events: none     Total Provider sedation time (minutes):  4 Post-procedure details:    Post-sedation assessment completed:  10/28/2019 11:00 AM   Attendance: Constant attendance by certified staff until patient recovered     Recovery: Patient returned to pre-procedure baseline     Post-sedation assessments completed and reviewed: airway patency, cardiovascular function, hydration status, mental status, nausea/vomiting, pain level, respiratory function and temperature     Patient is stable for discharge or admission: yes     Patient tolerance:  Tolerated well, no immediate complications      Gwyneth Sprout, MD 10/28/19 1428

## 2019-10-28 NOTE — Progress Notes (Signed)
I called Ms Welge and informed her of new arrival time of 67- clear liquids until 62.

## 2019-10-28 NOTE — Anesthesia Preprocedure Evaluation (Addendum)
Anesthesia Evaluation  Patient identified by MRN, date of birth, ID band Patient awake    Reviewed: Allergy & Precautions, NPO status , Patient's Chart, lab work & pertinent test results, reviewed documented beta blocker date and time   Airway Mallampati: II  TM Distance: >3 FB Neck ROM: Full    Dental no notable dental hx.    Pulmonary former smoker,    Pulmonary exam normal        Cardiovascular hypertension, Pt. on home beta blockers Normal cardiovascular exam     Neuro/Psych PSYCHIATRIC DISORDERS Anxiety Depression negative neurological ROS     GI/Hepatic negative GI ROS, Neg liver ROS,   Endo/Other  negative endocrine ROS  Renal/GU negative Renal ROS     Musculoskeletal  (+) Arthritis ,   Abdominal (+) + obese,   Peds  Hematology negative hematology ROS (+) anemia ,   Anesthesia Other Findings   Reproductive/Obstetrics negative OB ROS                            Anesthesia Physical Anesthesia Plan  ASA: III  Anesthesia Plan: General   Post-op Pain Management: GA combined w/ Regional for post-op pain   Induction: Intravenous  PONV Risk Score and Plan: 3 and Ondansetron, Dexamethasone, Treatment may vary due to age or medical condition and Midazolam  Airway Management Planned: LMA  Additional Equipment: None  Intra-op Plan:   Post-operative Plan: Extubation in OR  Informed Consent: I have reviewed the patients History and Physical, chart, labs and discussed the procedure including the risks, benefits and alternatives for the proposed anesthesia with the patient or authorized representative who has indicated his/her understanding and acceptance.     Dental advisory given  Plan Discussed with: CRNA  Anesthesia Plan Comments:         Anesthesia Quick Evaluation

## 2019-10-28 NOTE — Progress Notes (Signed)
Ms Buckhalter denies chest pain or shortness of breath.  I asked patient has anyone that can come to the  hospital to pick up Pre- Surgery, she said no. I instructed patient to not eat after midnight tonight. I instructed patient the she can have clear liquids until 0650, I reviewed with patient what clear liquids are, patient voiced understanding.

## 2019-10-28 NOTE — ED Provider Notes (Signed)
[facility]-EMERGENCY DEPT Provider Note   CSN: [id number] Arrival date & time: 10/28/19  1660     History Chief Complaint  Patient presents with  . Fall    Debbie Lee is a 49 y.o. female.  49 year old female brought in by EMS for right ankle injury.  Patient states that she was walking out of her house this morning when she slipped on the ice and fell and states when she fell she landed on top of her ankle.  Patient has been unable to bear weight on this ankle since the injury.  No prior injuries to this ankle.  Patient is not anticoagulated, did not hit her head or lose consciousness, denies any other injuries.  Patient last ate breakfast at 6:30 AM.        Past Medical History:  Diagnosis Date  . Anemia   . Depression   . Panic attack     There are no problems to display for this patient.   Past Surgical History:  Procedure Laterality Date  . NO PAST SURGERIES       OB History    Gravida  3   Para  1   Term      Preterm      AB  2   Living  1     SAB      TAB  2   Ectopic      Multiple      Live Births              Family History  Adopted: Yes    Social History   Tobacco Use  . Smoking status: Never Smoker  Substance Use Topics  . Alcohol use: Yes    Comment: 4/year  . Drug use: No    Home Medications Prior to Admission medications   Medication Sig Start Date End Date Taking? Authorizing Provider  etonogestrel (IMPLANON) 68 MG IMPL implant Inject 1 each into the skin once.   Yes [provider]  ibuprofen (ADVIL) 200 MG tablet Take 600 mg by mouth every 8 (eight) hours as needed for mild pain or moderate pain.    Yes [provider]  propranolol (INDERAL) 40 MG tablet Take 40 mg by mouth at bedtime. 06/20/19  Yes [provider]  sertraline (ZOLOFT) 50 MG tablet Take 75 mg by mouth at bedtime.    Yes [provider]  Turmeric 500 MG CAPS Take 500 mg by mouth at bedtime.    Yes [provider]  HYDROcodone-acetaminophen (NORCO/VICODIN) 5-325 MG tablet Take 1 tablet by mouth every 4 (four) hours as needed. 10/28/19   Jeannie Fend, PA-C  norgestimate-ethinyl estradiol (ORTHO-CYCLEN,SPRINTEC,PREVIFEM) 0.25-35 MG-MCG tablet Take 1 tablet by mouth daily. Patient not taking: Reported on 10/28/2019 07/01/15   Rasch, Victorino Dike I, NP  ondansetron (ZOFRAN ODT) 4 MG disintegrating tablet Take 1 tablet (4 mg total) by mouth every 8 (eight) hours as needed for nausea or vomiting. 10/28/19   Jeannie Fend, PA-C    Allergies    Codeine and Sulfa antibiotics  Review of Systems   Review of Systems  Constitutional: Negative for fever.  Musculoskeletal: Positive for arthralgias, gait problem, joint swelling and myalgias. Negative for back pain, neck pain and neck stiffness.  Skin: Positive for wound.  Allergic/Immunologic: Negative for immunocompromised state.  Neurological: Negative for weakness and numbness.  Psychiatric/Behavioral: Negative for confusion.  All other systems reviewed and are negative.   Physical Exam Updated Vital  Signs BP 138/80   Pulse 66   Temp 98.4 F (36.9 C) (Oral)   Resp (!) 21   Ht 5\' 3"  (1.6 m)   Wt 127 kg   LMP 10/21/2019   SpO2 98%   BMI 49.60 kg/m   Physical Exam Vitals and nursing note reviewed.  Constitutional:      General: She is not in acute distress.    Appearance: She is well-developed. She is not diaphoretic.  HENT:     Head: Normocephalic and atraumatic.  Cardiovascular:     Pulses: Normal pulses.  Pulmonary:     Effort: Pulmonary effort is normal.  Musculoskeletal:        General: Swelling, tenderness, deformity and signs of injury present.     Right ankle: Swelling and deformity present. No ecchymosis or lacerations. Tenderness present over the lateral malleolus and medial malleolus. No base of 5th metatarsal or proximal fibula tenderness. Decreased range of motion. Normal pulse.       Legs:  Skin:     General: Skin is warm and dry.     Capillary Refill: Capillary refill takes less than 2 seconds.     Findings: No erythema or rash.  Neurological:     Mental Status: She is alert and oriented to person, place, and time.     Sensory: No sensory deficit.  Psychiatric:        Behavior: Behavior normal.     ED Results / Procedures / Treatments   Labs (all labs ordered are listed, but only abnormal results are displayed) Labs Reviewed  SARS CORONAVIRUS 2 (TAT 6-24 HRS)    EKG None  Radiology DG Tibia/Fibula Right  Result Date: 10/28/2019 CLINICAL DATA:  Fall.  Right ankle pain. EXAM: RIGHT TIBIA AND FIBULA - 2 VIEW COMPARISON:  None. FINDINGS: Partially visualized acute displaced fracture of the distal fibula. No proximal tibia or fibular fracture. 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 Bone mineralization is normal. Soft tissues are unremarkable. IMPRESSION: Partially visualized acute displaced fracture of the distal fibula. Please see separate right ankle x-rays from same day. Electronically Signed   By: Titus Dubin M.D.   On: 10/28/2019 08:53   DG Ankle 2 Views Right  Result Date: 10/28/2019 CLINICAL DATA:  Fracture reduction EXAM: RIGHT ANKLE - 2 VIEW COMPARISON:  Same day radiographs, 8:27 a.m. FINDINGS: Interval reduction of compound fracture dislocation of the right ankle. There is improved, although persistent displacement of oblique fractures of the lateral malleolus and posterior malleolus, with persistent widening of the ankle mortise. The medial malleolus is intact. Cast material is applied about the ankle. No fracture of the included foot. Large plantar and Achilles calcaneal spurs. IMPRESSION: Interval reduction of compound fracture dislocation of the right ankle. There is improved, although persistent displacement of oblique fractures of the lateral malleolus and posterior malleolus, with persistent widening of the ankle mortise. The medial malleolus is intact.  Electronically Signed   By: Eddie Candle M.D.   On: 10/28/2019 10:19   DG Ankle Complete Right  Result Date: 10/28/2019 CLINICAL DATA:  Fall. EXAM: RIGHT ANKLE - COMPLETE 3+ VIEW COMPARISON:  None. FINDINGS: Acute, comminuted intra-articular fracture dislocation of the right ankle. Obliquely oriented comminuted fracture through the distal fibula is noted. There is also of fracture through the posterior malleolus of the distal tibia. Posterior and lateral tibiotalar joint dislocation is identified with approximately 2.8 cm lateral displacement of the talus. IMPRESSION: 1. Acute, comminuted intra-articular fracture dislocation of the right ankle.  Comminuted fractures involve the distal fibula and posterior malleolus. 2. Posterior and lateral displacement of the talus. Electronically Signed   By: Signa Kell M.D.   On: 10/28/2019 08:55    Procedures .Ortho Injury Treatment  Date/Time: 10/28/2019 10:35 AM Performed by: Jeannie Fend, PA-C Authorized by: Jeannie Fend, PA-C   Consent:    Consent obtained:  Verbal and written   Consent given by:  Patient   Risks discussed:  Fracture, nerve damage, recurrent dislocation, irreducible dislocation, restricted joint movement, stiffness and vascular damage   Alternatives discussed:  No treatmentInjury location: ankle Location details: right ankle Injury type: fracture-dislocation Fracture type: bimalleolar Pre-procedure neurovascular assessment: neurovascularly intact Pre-procedure distal perfusion: normal Pre-procedure neurological function: normal Pre-procedure range of motion: reduced  Anesthesia: Local anesthesia used: no  Patient sedated: Yes. Refer to sedation procedure documentation for details of sedation. Manipulation performed: yes Skin traction used: no Skeletal traction used: yes Reduction successful: yes X-ray confirmed reduction: yes Immobilization: splint and crutches Splint type: short leg and ankle stirrup Supplies  used: cotton padding,  elastic bandage and Ortho-Glass Post-procedure neurovascular assessment: post-procedure neurovascularly intact Post-procedure distal perfusion: normal Post-procedure neurological function: normal Post-procedure range of motion comment: splinted Patient tolerance: patient tolerated the procedure well with no immediate complications    (including critical care time)  Medications Ordered in ED Medications  propofol (DIPRIVAN) 10 mg/mL bolus/IV push 63.5 mg (63.5 mg Intravenous Not Given 10/28/19 0951)  ondansetron (ZOFRAN) injection 4 mg (4 mg Intravenous Given 10/28/19 0830)  HYDROmorphone (DILAUDID) injection 1 mg (1 mg Intravenous Given 10/28/19 0832)  sodium chloride 0.9 % bolus 500 mL (0 mLs Intravenous Stopped 10/28/19 0952)  propofol (DIPRIVAN) 1000 MG/100ML infusion (70 mg Intravenous New Bag/Given 10/28/19 0947)  HYDROcodone-acetaminophen (NORCO/VICODIN) 5-325 MG per tablet 1 tablet (1 tablet Oral Given 10/28/19 1101)    ED Course  I have reviewed the triage vital signs and the nursing notes.  Pertinent labs & imaging results that were available during my care of the patient were reviewed by me and considered in my medical decision making (see chart for details).  Clinical Course as of Oct 27 1113  Mon Oct 28, 2019  0929 48yo female with right ankle injury after fall this morning. On exam, +ankle deformity, pulse/sensation/motor intact, no pain in the knee/proximal fibula, no pain along 5th metatarsal.  XR with fracture dislocation- comminuted intra-articular fracture dislocation of right ankle, distal fibula and posterior malleolus.    [LM]  1022 Patient was sedated, fracture/dislocation reduced successfully with assistance from Dr. Anitra Lauth, ER attending.  Case was discussed with Dr. Eulah Pont with orthopedics who has reviewed prereduction and postreduction films, would like to see patient in the office this morning with plan for surgery tomorrow (Covid testing ordered).   Discussed with patient, she is attempting to arrange transportation.  Prescription for Norco and Zofran sent to patient's pharmacy.   [LM]    Clinical Course User Index [LM] Alden Hipp   MDM Rules/Calculators/A&P                      Final Clinical Impression(s) / ED Diagnoses Final diagnoses:  Closed fracture dislocation of right ankle, initial encounter    Rx / DC Orders ED Discharge Orders         Ordered    ondansetron (ZOFRAN ODT) 4 MG disintegrating tablet  Every 8 hours PRN     10/28/19 1031    HYDROcodone-acetaminophen (NORCO/VICODIN) 5-325 MG tablet  Every 4 hours PRN     10/28/19 1031           Alden Hipp 10/28/19 1115    Gwyneth Sprout, MD 10/28/19 1426

## 2019-10-28 NOTE — ED Notes (Signed)
Made John with ortho aware of crutches and ankle splint

## 2019-10-28 NOTE — Discharge Instructions (Signed)
Follow up with orthopedics- Dr. Eulah Pont can see you in the office this morning, he will not be in the office after lunch.  Take Zofran as needed as prescribed for nausea. Take Norco as needed as prescribed for pain. Elevate leg to help with pain and swelling, apply ice to leg for 20 minutes at a time.

## 2019-10-28 NOTE — ED Triage Notes (Signed)
Patient had unwitnessed fall on front porch this morning. Patient c/o ankle pain.  Deformity present to RT ankle.   Provider aware.

## 2019-10-29 ENCOUNTER — Ambulatory Visit (HOSPITAL_COMMUNITY): Payer: Self-pay | Admitting: Certified Registered"

## 2019-10-29 ENCOUNTER — Other Ambulatory Visit: Payer: Self-pay

## 2019-10-29 ENCOUNTER — Observation Stay (HOSPITAL_COMMUNITY)
Admission: RE | Admit: 2019-10-29 | Discharge: 2019-10-30 | Disposition: A | Discharge status: Home / Self Care | Payer: Self-pay | Attending: Orthopedic Surgery | Admitting: Orthopedic Surgery

## 2019-10-29 ENCOUNTER — Encounter (HOSPITAL_COMMUNITY)
Admission: RE | Disposition: A | Discharge status: Home / Self Care | Payer: Self-pay | Source: Home / Self Care | Attending: Orthopedic Surgery

## 2019-10-29 ENCOUNTER — Encounter (HOSPITAL_COMMUNITY): Payer: Self-pay | Admitting: Orthopedic Surgery

## 2019-10-29 DIAGNOSIS — Z79899 Other long term (current) drug therapy: Secondary | ICD-10-CM | POA: Insufficient documentation

## 2019-10-29 DIAGNOSIS — Z885 Allergy status to narcotic agent status: Secondary | ICD-10-CM | POA: Insufficient documentation

## 2019-10-29 DIAGNOSIS — Z882 Allergy status to sulfonamides status: Secondary | ICD-10-CM | POA: Insufficient documentation

## 2019-10-29 DIAGNOSIS — W109XXA Fall (on) (from) unspecified stairs and steps, initial encounter: Secondary | ICD-10-CM | POA: Insufficient documentation

## 2019-10-29 DIAGNOSIS — S82891A Other fracture of right lower leg, initial encounter for closed fracture: Principal | ICD-10-CM | POA: Insufficient documentation

## 2019-10-29 DIAGNOSIS — I10 Essential (primary) hypertension: Secondary | ICD-10-CM | POA: Insufficient documentation

## 2019-10-29 DIAGNOSIS — F329 Major depressive disorder, single episode, unspecified: Secondary | ICD-10-CM | POA: Insufficient documentation

## 2019-10-29 DIAGNOSIS — Z793 Long term (current) use of hormonal contraceptives: Secondary | ICD-10-CM | POA: Insufficient documentation

## 2019-10-29 DIAGNOSIS — Z87891 Personal history of nicotine dependence: Secondary | ICD-10-CM | POA: Insufficient documentation

## 2019-10-29 HISTORY — PX: ORIF ANKLE FRACTURE: SHX5408

## 2019-10-29 HISTORY — DX: Essential (primary) hypertension: I10

## 2019-10-29 HISTORY — DX: Unspecified osteoarthritis, unspecified site: M19.90

## 2019-10-29 LAB — CBC
HCT: 40.3 % (ref 36.0–46.0)
Hemoglobin: 12.2 g/dL (ref 12.0–15.0)
MCH: 27.7 pg (ref 26.0–34.0)
MCHC: 30.3 g/dL (ref 30.0–36.0)
MCV: 91.6 fL (ref 80.0–100.0)
Platelets: 298 10*3/uL (ref 150–400)
RBC: 4.4 MIL/uL (ref 3.87–5.11)
RDW: 14.2 % (ref 11.5–15.5)
WBC: 11.2 10*3/uL — ABNORMAL HIGH (ref 4.0–10.5)
nRBC: 0 % (ref 0.0–0.2)

## 2019-10-29 LAB — BASIC METABOLIC PANEL
Anion gap: 10 (ref 5–15)
BUN: 12 mg/dL (ref 6–20)
CO2: 25 mmol/L (ref 22–32)
Calcium: 8.7 mg/dL — ABNORMAL LOW (ref 8.9–10.3)
Chloride: 102 mmol/L (ref 98–111)
Creatinine, Ser: 0.86 mg/dL (ref 0.44–1.00)
GFR calc Af Amer: >60 mL/min (ref >60)
GFR calc non Af Amer: >60 mL/min (ref >60)
Glucose, Bld: 119 mg/dL — ABNORMAL HIGH (ref 70–99)
Potassium: 3.7 mmol/L (ref 3.5–5.1)
Sodium: 137 mmol/L (ref 135–145)

## 2019-10-29 LAB — POCT PREGNANCY, URINE: Preg Test, Ur: NEGATIVE

## 2019-10-29 SURGERY — OPEN REDUCTION INTERNAL FIXATION (ORIF) ANKLE FRACTURE
Anesthesia: General | Site: Ankle | Laterality: Right

## 2019-10-29 MED ORDER — MIDAZOLAM HCL 2 MG/2ML IJ SOLN
INTRAMUSCULAR | Status: AC
  Filled 2019-10-29: qty 2

## 2019-10-29 MED ORDER — HYDROMORPHONE HCL 1 MG/ML IJ SOLN: 0.2500 mg | INTRAMUSCULAR | Status: DC | PRN

## 2019-10-29 MED ORDER — DOCUSATE SODIUM 100 MG PO CAPS
100.0000 mg | ORAL_CAPSULE | Freq: Two times a day (BID) | ORAL | Status: DC
  Administered 2019-10-29 – 2019-10-30 (×2): 100 mg via ORAL
  Filled 2019-10-29 (×2): qty 1

## 2019-10-29 MED ORDER — CHLORHEXIDINE GLUCONATE 4 % EX LIQD: 60.0000 mL | Freq: Once | CUTANEOUS | Status: DC

## 2019-10-29 MED ORDER — DEXTROSE 5 % IV SOLN
3.0000 g | INTRAVENOUS | Status: AC
  Administered 2019-10-29: 10:00:00 3 g via INTRAVENOUS
  Filled 2019-10-29 (×2): qty 3000
  Filled 2019-10-29: qty 3

## 2019-10-29 MED ORDER — ONDANSETRON HCL 4 MG/2ML IJ SOLN: 4.0000 mg | Freq: Four times a day (QID) | INTRAMUSCULAR | Status: DC | PRN

## 2019-10-29 MED ORDER — LIDOCAINE 2% (20 MG/ML) 5 ML SYRINGE
INTRAMUSCULAR | Status: DC | PRN
  Administered 2019-10-29: 100 mg via INTRAVENOUS

## 2019-10-29 MED ORDER — LACTATED RINGERS IV SOLN: INTRAVENOUS | Status: DC

## 2019-10-29 MED ORDER — CEFAZOLIN SODIUM-DEXTROSE 2-4 GM/100ML-% IV SOLN
2.0000 g | Freq: Four times a day (QID) | INTRAVENOUS | Status: AC
  Administered 2019-10-29 – 2019-10-30 (×3): 2 g via INTRAVENOUS
  Filled 2019-10-29 (×3): qty 100

## 2019-10-29 MED ORDER — METOCLOPRAMIDE HCL 5 MG PO TABS: 5.0000 mg | ORAL_TABLET | Freq: Three times a day (TID) | ORAL | Status: DC | PRN

## 2019-10-29 MED ORDER — 0.9 % SODIUM CHLORIDE (POUR BTL) OPTIME
TOPICAL | Status: DC | PRN
  Administered 2019-10-29: 1000 mL

## 2019-10-29 MED ORDER — OXYCODONE HCL 5 MG PO TABS: 5.0000 mg | ORAL_TABLET | ORAL | 0 refills | Status: AC | PRN

## 2019-10-29 MED ORDER — ASPIRIN EC 81 MG PO TBEC: 81.0000 mg | DELAYED_RELEASE_TABLET | Freq: Two times a day (BID) | ORAL | 0 refills | Status: DC

## 2019-10-29 MED ORDER — ACETAMINOPHEN 500 MG PO TABS
1000.0000 mg | ORAL_TABLET | Freq: Three times a day (TID) | ORAL | Status: AC
  Administered 2019-10-29 – 2019-10-30 (×4): 1000 mg via ORAL
  Filled 2019-10-29 (×4): qty 2

## 2019-10-29 MED ORDER — ACETAMINOPHEN 325 MG PO TABS: 325.0000 mg | ORAL_TABLET | Freq: Four times a day (QID) | ORAL | Status: DC | PRN

## 2019-10-29 MED ORDER — METHOCARBAMOL 500 MG PO TABS: 500.0000 mg | ORAL_TABLET | Freq: Three times a day (TID) | ORAL | 0 refills | Status: DC | PRN

## 2019-10-29 MED ORDER — ACETAMINOPHEN 500 MG PO TABS
1000.0000 mg | ORAL_TABLET | Freq: Once | ORAL | Status: AC
  Administered 2019-10-29: 1000 mg via ORAL
  Filled 2019-10-29: qty 2

## 2019-10-29 MED ORDER — PROPOFOL 10 MG/ML IV BOLUS
INTRAVENOUS | Status: DC | PRN
  Administered 2019-10-29: 200 mg via INTRAVENOUS

## 2019-10-29 MED ORDER — PROPRANOLOL HCL 40 MG PO TABS
40.0000 mg | ORAL_TABLET | Freq: Every day | ORAL | Status: DC
  Administered 2019-10-29: 40 mg via ORAL
  Filled 2019-10-29 (×2): qty 1

## 2019-10-29 MED ORDER — ONDANSETRON HCL 4 MG PO TABS: 4.0000 mg | ORAL_TABLET | Freq: Four times a day (QID) | ORAL | Status: DC | PRN

## 2019-10-29 MED ORDER — HYDROMORPHONE HCL 1 MG/ML IJ SOLN: 0.5000 mg | INTRAMUSCULAR | Status: DC | PRN

## 2019-10-29 MED ORDER — BUPIVACAINE-EPINEPHRINE (PF) 0.5% -1:200000 IJ SOLN
INTRAMUSCULAR | Status: DC | PRN
  Administered 2019-10-29: 30 mL via PERINEURAL

## 2019-10-29 MED ORDER — PHENYLEPHRINE HCL (PRESSORS) 10 MG/ML IV SOLN
INTRAVENOUS | Status: DC | PRN
  Administered 2019-10-29 (×3): 80 ug via INTRAVENOUS

## 2019-10-29 MED ORDER — ONDANSETRON HCL 4 MG/2ML IJ SOLN
INTRAMUSCULAR | Status: DC | PRN
  Administered 2019-10-29: 4 mg via INTRAVENOUS

## 2019-10-29 MED ORDER — SERTRALINE HCL 50 MG PO TABS
75.0000 mg | ORAL_TABLET | Freq: Every day | ORAL | Status: DC
  Administered 2019-10-29: 75 mg via ORAL
  Filled 2019-10-29: qty 2

## 2019-10-29 MED ORDER — MEPERIDINE HCL 25 MG/ML IJ SOLN: 6.2500 mg | INTRAMUSCULAR | Status: DC | PRN

## 2019-10-29 MED ORDER — DIPHENHYDRAMINE HCL 12.5 MG/5ML PO ELIX
12.5000 mg | ORAL_SOLUTION | ORAL | Status: DC | PRN
  Filled 2019-10-29: qty 10

## 2019-10-29 MED ORDER — METOCLOPRAMIDE HCL 5 MG/ML IJ SOLN: 5.0000 mg | Freq: Three times a day (TID) | INTRAMUSCULAR | Status: DC | PRN

## 2019-10-29 MED ORDER — KETOROLAC TROMETHAMINE 15 MG/ML IJ SOLN
7.5000 mg | Freq: Four times a day (QID) | INTRAMUSCULAR | Status: AC
  Administered 2019-10-29 – 2019-10-30 (×3): 7.5 mg via INTRAVENOUS
  Filled 2019-10-29 (×3): qty 1

## 2019-10-29 MED ORDER — ENSURE PRE-SURGERY PO LIQD: 296.0000 mL | Freq: Once | ORAL | Status: DC

## 2019-10-29 MED ORDER — BUPIVACAINE HCL (PF) 0.25 % IJ SOLN
INTRAMUSCULAR | Status: AC
  Filled 2019-10-29: qty 30

## 2019-10-29 MED ORDER — POLYETHYLENE GLYCOL 3350 17 G PO PACK: 17.0000 g | PACK | Freq: Every day | ORAL | Status: DC | PRN

## 2019-10-29 MED ORDER — METHOCARBAMOL 500 MG PO TABS
500.0000 mg | ORAL_TABLET | Freq: Four times a day (QID) | ORAL | Status: DC | PRN
  Administered 2019-10-29 – 2019-10-30 (×2): 500 mg via ORAL
  Filled 2019-10-29 (×2): qty 1

## 2019-10-29 MED ORDER — FENTANYL CITRATE (PF) 100 MCG/2ML IJ SOLN
INTRAMUSCULAR | Status: DC | PRN
  Administered 2019-10-29: 50 ug via INTRAVENOUS

## 2019-10-29 MED ORDER — MIDAZOLAM HCL 2 MG/2ML IJ SOLN
INTRAMUSCULAR | Status: AC
  Administered 2019-10-29: 1 mg via INTRAVENOUS
  Filled 2019-10-29: qty 2

## 2019-10-29 MED ORDER — OXYCODONE HCL 5 MG PO TABS
5.0000 mg | ORAL_TABLET | ORAL | Status: DC | PRN
  Administered 2019-10-30 (×2): 5 mg via ORAL
  Filled 2019-10-29 (×2): qty 1

## 2019-10-29 MED ORDER — FENTANYL CITRATE (PF) 250 MCG/5ML IJ SOLN
INTRAMUSCULAR | Status: AC
  Filled 2019-10-29: qty 5

## 2019-10-29 MED ORDER — METHOCARBAMOL 1000 MG/10ML IJ SOLN
500.0000 mg | Freq: Four times a day (QID) | INTRAVENOUS | Status: DC | PRN
  Filled 2019-10-29: qty 5

## 2019-10-29 MED ORDER — PROMETHAZINE HCL 25 MG/ML IJ SOLN: 6.2500 mg | INTRAMUSCULAR | Status: DC | PRN

## 2019-10-29 MED ORDER — DOCUSATE SODIUM 100 MG PO CAPS: 100.0000 mg | ORAL_CAPSULE | Freq: Two times a day (BID) | ORAL | 0 refills | Status: DC

## 2019-10-29 MED ORDER — FENTANYL CITRATE (PF) 100 MCG/2ML IJ SOLN
INTRAMUSCULAR | Status: AC
  Administered 2019-10-29: 50 ug via INTRAVENOUS
  Filled 2019-10-29: qty 2

## 2019-10-29 MED ORDER — MIDAZOLAM HCL 2 MG/2ML IJ SOLN: 1.0000 mg | Freq: Once | INTRAMUSCULAR | Status: AC

## 2019-10-29 MED ORDER — CLONIDINE HCL (ANALGESIA) 100 MCG/ML EP SOLN
EPIDURAL | Status: DC | PRN
  Administered 2019-10-29: 100 ug

## 2019-10-29 MED ORDER — PROPOFOL 10 MG/ML IV BOLUS
INTRAVENOUS | Status: AC
  Filled 2019-10-29: qty 20

## 2019-10-29 MED ORDER — FENTANYL CITRATE (PF) 100 MCG/2ML IJ SOLN: 50.0000 ug | Freq: Once | INTRAMUSCULAR | Status: AC

## 2019-10-29 MED ORDER — ENOXAPARIN SODIUM 40 MG/0.4ML ~~LOC~~ SOLN
40.0000 mg | SUBCUTANEOUS | Status: DC
  Administered 2019-10-30: 40 mg via SUBCUTANEOUS
  Filled 2019-10-29: qty 0.4

## 2019-10-29 MED ORDER — DEXAMETHASONE SODIUM PHOSPHATE 10 MG/ML IJ SOLN
INTRAMUSCULAR | Status: DC | PRN
  Administered 2019-10-29: 10 mg via INTRAVENOUS

## 2019-10-29 SURGICAL SUPPLY — 65 items
BANDAGE ESMARK 6X9 LF (GAUZE/BANDAGES/DRESSINGS) ×1 IMPLANT
BIT DRILL 3.5X122MM AO FIT (BIT) ×3 IMPLANT
BNDG ELASTIC 4X5.8 VLCR STR LF (GAUZE/BANDAGES/DRESSINGS) ×3 IMPLANT
BNDG ELASTIC 6X5.8 VLCR STR LF (GAUZE/BANDAGES/DRESSINGS) ×3 IMPLANT
BNDG ESMARK 6X9 LF (GAUZE/BANDAGES/DRESSINGS) ×3
CANISTER SUCT 3000ML PPV (MISCELLANEOUS) ×3 IMPLANT
CLOSURE WOUND 1/2 X4 (GAUZE/BANDAGES/DRESSINGS) ×1
COUNTERSINK (MISCELLANEOUS) ×3
COVER SURGICAL LIGHT HANDLE (MISCELLANEOUS) ×3 IMPLANT
CUFF TOURN SGL QUICK 34 (TOURNIQUET CUFF) ×2
CUFF TRNQT CYL 34X4.125X (TOURNIQUET CUFF) ×1 IMPLANT
DRAPE OEC MINIVIEW 54X84 (DRAPES) ×3 IMPLANT
DRAPE U-SHAPE 47X51 STRL (DRAPES) IMPLANT
DRILL 2.6X122MM WL AO SHAFT (BIT) ×3 IMPLANT
DRSG ADAPTIC 3X8 NADH LF (GAUZE/BANDAGES/DRESSINGS) ×3 IMPLANT
DRSG PAD ABDOMINAL 8X10 ST (GAUZE/BANDAGES/DRESSINGS) ×9 IMPLANT
DURAPREP 26ML APPLICATOR (WOUND CARE) ×3 IMPLANT
ELECT REM PT RETURN 9FT ADLT (ELECTROSURGICAL) ×3
ELECTRODE REM PT RTRN 9FT ADLT (ELECTROSURGICAL) ×1 IMPLANT
GAUZE SPONGE 4X4 12PLY STRL (GAUZE/BANDAGES/DRESSINGS) ×3 IMPLANT
GLOVE BIO SURGEON STRL SZ7.5 (GLOVE) ×6 IMPLANT
GLOVE BIO SURGEON STRL SZ8 (GLOVE) ×3 IMPLANT
GLOVE BIOGEL PI IND STRL 8 (GLOVE) ×2 IMPLANT
GLOVE BIOGEL PI INDICATOR 8 (GLOVE) ×4
GOWN STRL REUS W/ TWL LRG LVL3 (GOWN DISPOSABLE) ×2 IMPLANT
GOWN STRL REUS W/ TWL XL LVL3 (GOWN DISPOSABLE) ×1 IMPLANT
GOWN STRL REUS W/TWL LRG LVL3 (GOWN DISPOSABLE) ×4
GOWN STRL REUS W/TWL XL LVL3 (GOWN DISPOSABLE) ×2
IMPL TIGHTROP W/DRV K-LESS (Anchor) ×1 IMPLANT
IMPLANT TIGHTROPE W/DRV K-LESS (Anchor) ×3 IMPLANT
KIT BASIN OR (CUSTOM PROCEDURE TRAY) ×3 IMPLANT
KIT TURNOVER KIT B (KITS) ×3 IMPLANT
NS IRRIG 1000ML POUR BTL (IV SOLUTION) ×3 IMPLANT
PAD ARMBOARD 7.5X6 YLW CONV (MISCELLANEOUS) ×6 IMPLANT
PAD CAST 4YDX4 CTTN HI CHSV (CAST SUPPLIES) ×2 IMPLANT
PADDING CAST COTTON 4X4 STRL (CAST SUPPLIES) ×4
PADDING CAST COTTON 6X4 STRL (CAST SUPPLIES) ×3 IMPLANT
PLATE 1/3 TUBULAR 7H (Plate) ×3 IMPLANT
SCREW CANC 2.5XFT HEX12X4X (Screw) ×1 IMPLANT
SCREW CANCELLOUS 4.0X12MM (Screw) ×2 IMPLANT
SCREW CANCELLOUS 4.0X14 (Screw) ×3 IMPLANT
SCREW CORTEX ST MATTA 3.5X14 (Screw) ×9 IMPLANT
SCREW CORTEX ST MATTA 3.5X24 (Screw) ×3 IMPLANT
SCREW CORTEX ST MATTA 3.5X26MM (Screw) ×3 IMPLANT
SCREW COUNTERSINK (MISCELLANEOUS) ×1 IMPLANT
SPLINT PLASTER CAST XFAST 5X30 (CAST SUPPLIES) ×1 IMPLANT
SPLINT PLASTER XFAST SET 5X30 (CAST SUPPLIES) ×2
SPONGE LAP 18X18 RF (DISPOSABLE) IMPLANT
STRIP CLOSURE SKIN 1/2X4 (GAUZE/BANDAGES/DRESSINGS) ×2 IMPLANT
SUCTION FRAZIER HANDLE 10FR (MISCELLANEOUS) ×2
SUCTION TUBE FRAZIER 10FR DISP (MISCELLANEOUS) ×1 IMPLANT
SUT ETHILON 3 0 PS 1 (SUTURE) ×6 IMPLANT
SUT MON AB 2-0 CT1 27 (SUTURE) ×3 IMPLANT
SUT VIC AB 0 CT1 27 (SUTURE) ×2
SUT VIC AB 0 CT1 27XBRD ANBCTR (SUTURE) ×1 IMPLANT
SUT VIC AB 2-0 CT1 27 (SUTURE) ×2
SUT VIC AB 2-0 CT1 TAPERPNT 27 (SUTURE) ×1 IMPLANT
SYR BULB IRRIGATION 50ML (SYRINGE) ×3 IMPLANT
TOWEL GREEN STERILE (TOWEL DISPOSABLE) ×3 IMPLANT
TOWEL GREEN STERILE FF (TOWEL DISPOSABLE) ×3 IMPLANT
TUBE CONNECTING 12'X1/4 (SUCTIONS) ×1
TUBE CONNECTING 12X1/4 (SUCTIONS) ×2 IMPLANT
UNDERPAD 30X30 (UNDERPADS AND DIAPERS) ×3 IMPLANT
WATER STERILE IRR 1000ML POUR (IV SOLUTION) ×3 IMPLANT
YANKAUER SUCT BULB TIP NO VENT (SUCTIONS) ×3 IMPLANT

## 2019-10-29 NOTE — Transfer of Care (Signed)
Immediate Anesthesia Transfer of Care Note  Patient: Debbie Lee  Procedure(s) Performed: OPEN REDUCTION INTERNAL FIXATION (ORIF) ANKLE FRACTURE (Right Ankle)  Patient Location: PACU  Anesthesia Type:General  Level of Consciousness: responds to stimulation  Airway & Oxygen Therapy: Patient Spontanous Breathing and Patient connected to face mask oxygen  Post-op Assessment: Report given to RN and Post -op Vital signs reviewed and stable  Post vital signs: Reviewed and stable  Last Vitals:  Vitals Value Taken Time  BP 132/65 10/29/19 1400  Temp 37.4 C 10/29/19 1400  Pulse 77 10/29/19 1400  Resp 18 10/29/19 1400  SpO2 96 % 10/29/19 1400    Last Pain:  Vitals:   10/29/19 1400  TempSrc: Oral  PainSc:          Complications: No apparent anesthesia complications

## 2019-10-29 NOTE — Progress Notes (Signed)
Patient suffers from R ankle fracture s/p ORIF which impairs their ability to perform daily activities like ambulation in the home.  A walker alone will not resolve the issues with performing activities of daily living. A wheelchair will allow patient to safely perform daily activities.  The patient can self propel in the home or has a caregiver who can provide assistance.     Farley Ly, PT, DPT  Acute Rehabilitation Services  Pager: [phone] Office: [phone]

## 2019-10-29 NOTE — Anesthesia Postprocedure Evaluation (Signed)
Anesthesia Post Note  Patient: Debbie Lee  Procedure(s) Performed: OPEN REDUCTION INTERNAL FIXATION (ORIF) ANKLE FRACTURE (Right Ankle)     Patient location during evaluation: PACU Anesthesia Type: General Level of consciousness: awake and alert and oriented Pain management: pain level controlled Vital Signs Assessment: post-procedure vital signs reviewed and stable Respiratory status: spontaneous breathing, nonlabored ventilation and respiratory function stable Cardiovascular status: blood pressure returned to baseline Postop Assessment: no apparent nausea or vomiting Anesthetic complications: no               Kaylyn Layer

## 2019-10-29 NOTE — Anesthesia Procedure Notes (Signed)
Procedure Name: LMA Insertion Date/Time: 10/29/2019 10:01 AM Performed by: Sheppard Evens, CRNA Pre-anesthesia Checklist: Patient identified, Emergency Drugs available, Suction available and Patient being monitored Patient Re-evaluated:Patient Re-evaluated prior to induction Oxygen Delivery Method: Circle System Utilized Preoxygenation: Pre-oxygenation with 100% oxygen Induction Type: IV induction LMA: LMA inserted LMA Size: 4.0 Number of attempts: 1 Airway Equipment and Method: Bite block Placement Confirmation: positive ETCO2 Tube secured with: Tape Dental Injury: Teeth and Oropharynx as per pre-operative assessment

## 2019-10-29 NOTE — TOC Initial Note (Signed)
Transition of Care [facility]) - Initial/Assessment Note    Patient Details  Name: Debbie Lee MRN: [id number] Date of Birth: 04/29/1971  Transition of Care [facility]) CM/SW Contact:    Glennon Mac, RN Phone Number: 10/29/2019, 4:54 PM  Clinical Narrative: Pt is a 50 y/o female s/p ORIF of R ankle fx. Pt sustained ankle fx after falling down icy steps. PMH includes HTN.  PTA, pt independent of ADLS.  Pt plans to dc home with her daughter to assist with care.  She has no insurance, but qualifies for charity [facility] services and DME.  Referrals made to [facility] for all needed DME, and [facility] for HHPT.                    Expected Discharge Plan: Home w Home Health Services Barriers to Discharge: Continued Medical Work up   Patient Goals and CMS Choice   CMS Medicare.gov Compare Post Acute Care list provided to:: Patient    Expected Discharge Plan and Services Expected Discharge Plan: Home w Home Health Services   Discharge Planning Services: CM Consult Post Acute Care Choice: Home Health Living arrangements for the past 2 months: Single Family Home Expected Discharge Date: 10/30/19               DME Arranged: Wheelchair manual, 3-N-1, Walker rolling DME Agency: [facility] Date DME Agency Contacted: 10/29/19 Time DME Agency Contacted: 1600 Representative spoke with at DME Agency: Oletha Cruel HH Arranged: PT HH Agency: [facility] Date HH Agency Contacted: 10/29/19 Time HH Agency Contacted: 1652 Representative spoke with at [facility] Agency: Sanjuana Letters  Prior Living Arrangements/Services Living arrangements for the past 2 months: Single Family Home Lives with:: Adult Children Patient language and need for interpreter reviewed:: Yes Do you feel safe going back to the place where you live?: Yes      Need for Family Participation in Patient Care: Yes (Comment) Care giver support system in place?: Yes (comment)   Criminal Activity/Legal Involvement Pertinent to  Current Situation/Hospitalization: No - Comment as needed  Activities of Daily Living Home Assistive Devices/Equipment: Other (Comment), Eyeglasses, Crutches ADL Screening (condition at time of admission) Patient's cognitive ability adequate to safely complete daily activities?: Yes Is the patient deaf or have difficulty hearing?: No Does the patient have difficulty seeing, even when wearing glasses/contacts?: No Does the patient have difficulty concentrating, remembering, or making decisions?: No Patient able to express need for assistance with ADLs?: Yes Does the patient have difficulty dressing or bathing?: Yes Independently performs ADLs?: Yes (appropriate for developmental age) Does the patient have difficulty walking or climbing stairs?: Yes Weakness of Legs: None Weakness of Arms/Hands: Right  Permission Sought/Granted   Permission granted to share information with : Yes, Verbal Permission Granted     Permission granted to share info w AGENCY: [facility]        Emotional Assessment Appearance:: Appears stated age Attitude/Demeanor/Rapport: Engaged Affect (typically observed): Accepting Orientation: : Oriented to Self, Oriented to Place, Oriented to  Time, Oriented to Situation Alcohol / Substance Use: Not Applicable Psych Involvement: No (comment)  Admission diagnosis:  Closed right ankle fracture [S82.891A] Patient Active Problem List   Diagnosis Date Noted  . Closed right ankle fracture 10/29/2019   PCP:  Patient, No Pcp Per Pharmacy:   [facility] - [place], [place] - [address] [place] [address] Phone: [phone] Fax: [phone]  [facility] -  [place], [place] - [address] [place] [address] Phone: [phone] Fax: [phone]  [facility] [address], [place] - [place]  [address] Phone: [phone] Fax: [phone]     Social Determinants of Health (SDOH) Interventions    Readmission Risk Interventions No flowsheet data found.  Reinaldo Raddle, RN, BSN  Trauma/Neuro ICU Case Manager [phone]

## 2019-10-29 NOTE — H&P (Signed)
ORTHOPAEDIC CONSULTATION  REQUESTING PHYSICIAN: Renette Butters, MD  Chief Complaint: right ankle fracture  HPI: Debbie Lee is a 49 y.o. female who complains of a fall down stairs with a  trimal ankle fracture. Reduced in the ED  Past Medical History:  Diagnosis Date  . Anemia   . Arthritis   . Depression   . Hypertension   . Panic attack    Past Surgical History:  Procedure Laterality Date  . NO PAST SURGERIES     Social History   Socioeconomic History  . Marital status: Married    Spouse name: Not on file  . Number of children: Not on file  . Years of education: Not on file  . Highest education level: Not on file  Occupational History  . Not on file  Tobacco Use  . Smoking status: Former Smoker    Years: 22.00  . Smokeless tobacco: Never Used  Substance and Sexual Activity  . Alcohol use: Not Currently    Comment: 4/year  . Drug use: No  . Sexual activity: Not Currently    Birth control/protection: Implant  Other Topics Concern  . Not on file  Social History Narrative  . Not on file   Social Determinants of Health   Financial Resource Strain:   . Difficulty of Paying Living Expenses: Not on file  Food Insecurity:   . Worried About Charity fundraiser in the Last Year: Not on file  . Ran Out of Food in the Last Year: Not on file  Transportation Needs:   . Lack of Transportation (Medical): Not on file  . Lack of Transportation (Non-Medical): Not on file  Physical Activity:   . Days of Exercise per Week: Not on file  . Minutes of Exercise per Session: Not on file  Stress:   . Feeling of Stress : Not on file  Social Connections:   . Frequency of Communication with Friends and Family: Not on file  . Frequency of Social Gatherings with Friends and Family: Not on file  . Attends Religious Services: Not on file  . Active Member of Clubs or Organizations: Not on file  . Attends Archivist Meetings: Not on file  . Marital Status: Not on  file   Family History  Adopted: Yes   Allergies  Allergen Reactions  . Codeine Other (See Comments)    "Hallucinations. Narcotics cause vomiting."  . Sulfa Antibiotics Other (See Comments)    "lifelong allergy"   Prior to Admission medications   Medication Sig Start Date End Date Taking? Authorizing Provider  etonogestrel (IMPLANON) 68 MG IMPL implant Inject 1 each into the skin once.    [provider]  HYDROcodone-acetaminophen (NORCO/VICODIN) 5-325 MG tablet Take 1 tablet by mouth every 4 (four) hours as needed. 10/28/19   Tacy Learn, PA-C  ibuprofen (ADVIL) 200 MG tablet Take 600 mg by mouth every 8 (eight) hours as needed for mild pain or moderate pain.     [provider]  norgestimate-ethinyl estradiol (ORTHO-CYCLEN,SPRINTEC,PREVIFEM) 0.25-35 MG-MCG tablet Take 1 tablet by mouth daily. Patient not taking: Reported on 10/28/2019 07/01/15   Rasch, Anderson Malta I, NP  ondansetron (ZOFRAN ODT) 4 MG disintegrating tablet Take 1 tablet (4 mg total) by mouth every 8 (eight) hours as needed for nausea or vomiting. 10/28/19   Tacy Learn, PA-C  propranolol (INDERAL) 40 MG tablet Take 40 mg by mouth at bedtime. 06/20/19   [provider]  sertraline (  ZOLOFT) 50 MG tablet Take 75 mg by mouth at bedtime.     [provider]  Turmeric 500 MG CAPS Take 500 mg by mouth at bedtime.    [provider]   DG Tibia/Fibula Right  Result Date: 10/28/2019 CLINICAL DATA:  Fall.  Right ankle pain. EXAM: RIGHT TIBIA AND FIBULA - 2 VIEW COMPARISON:  None. FINDINGS: Partially visualized acute displaced fracture of the distal fibula. No proximal tibia or fibular fracture. 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 Bone mineralization is normal. Soft tissues are unremarkable. IMPRESSION: Partially visualized acute displaced fracture of the distal fibula. Please see separate right ankle x-rays from same day. Electronically Signed   By: Titus Dubin M.D.   On:  10/28/2019 08:53   DG Ankle 2 Views Right  Result Date: 10/28/2019 CLINICAL DATA:  Fracture reduction EXAM: RIGHT ANKLE - 2 VIEW COMPARISON:  Same day radiographs, 8:27 a.m. FINDINGS: Interval reduction of compound fracture dislocation of the right ankle. There is improved, although persistent displacement of oblique fractures of the lateral malleolus and posterior malleolus, with persistent widening of the ankle mortise. The medial malleolus is intact. Cast material is applied about the ankle. No fracture of the included foot. Large plantar and Achilles calcaneal spurs. IMPRESSION: Interval reduction of compound fracture dislocation of the right ankle. There is improved, although persistent displacement of oblique fractures of the lateral malleolus and posterior malleolus, with persistent widening of the ankle mortise. The medial malleolus is intact. Electronically Signed   By: Eddie Candle M.D.   On: 10/28/2019 10:19   DG Ankle Complete Right  Result Date: 10/28/2019 CLINICAL DATA:  Fall. EXAM: RIGHT ANKLE - COMPLETE 3+ VIEW COMPARISON:  None. FINDINGS: Acute, comminuted intra-articular fracture dislocation of the right ankle. Obliquely oriented comminuted fracture through the distal fibula is noted. There is also of fracture through the posterior malleolus of the distal tibia. Posterior and lateral tibiotalar joint dislocation is identified with approximately 2.8 cm lateral displacement of the talus. IMPRESSION: 1. Acute, comminuted intra-articular fracture dislocation of the right ankle. Comminuted fractures involve the distal fibula and posterior malleolus. 2. Posterior and lateral displacement of the talus. Electronically Signed   By: Kerby Moors M.D.   On: 10/28/2019 08:55    Positive ROS: All other systems have been reviewed and were otherwise negative with the exception of those mentioned in the HPI and as above.  Labs cbc No results for input(s): WBC, HGB, HCT, PLT in the last 72  hours.  Labs inflam No results for input(s): CRP in the last 72 hours.  Invalid input(s): ESR  Labs coag No results for input(s): INR, PTT in the last 72 hours.  Invalid input(s): PT  No results for input(s): NA, K, CL, CO2, GLUCOSE, BUN, CREATININE, CALCIUM in the last 72 hours.  Physical Exam: There were no vitals filed for this visit. General: Alert, no acute distress Cardiovascular: No pedal edema Respiratory: No cyanosis, no use of accessory musculature GI: No organomegaly, abdomen is soft and non-tender Skin: No lesions in the area of chief complaint other than those listed below in MSK exam.  Neurologic: Sensation intact distally save for the below mentioned MSK exam Psychiatric: Patient is competent for consent with normal mood and affect Lymphatic: No axillary or cervical lymphadenopathy  MUSCULOSKELETAL:  RLE: NVI, splint intact Other extremities are atraumatic with painless ROM and NVI.  Assessment: R trimal fx  Plan: I discussed R&B of ORIF of this fracture. She would like to go  forward with this.    Renette Butters, MD    10/29/2019 8:26 AM

## 2019-10-29 NOTE — Anesthesia Procedure Notes (Signed)
Anesthesia Regional Block: Popliteal block   Pre-Anesthetic Checklist: ,, timeout performed, Correct Patient, Correct Site, Correct Laterality, Correct Procedure, Correct Position, site marked, Risks and benefits discussed, pre-op evaluation,  At surgeon's request and post-op pain management  Laterality: Right  Prep: Maximum Sterile Barrier Precautions used, chloraprep       Needles:  Injection technique: Single-shot  Needle Type: Echogenic Stimulator Needle     Needle Length: 9cm  Needle Gauge: 22     Additional Needles:   Procedures:,,,, ultrasound used (permanent image in chart),,,,  Narrative:  Start time: 10/29/2019 9:46 AM End time: 10/29/2019 9:49 AM Injection made incrementally with aspirations every 5 mL.  Performed by: Personally  Anesthesiologist: Kaylyn Layer, MD  Additional Notes: Risks, benefits, and alternative discussed. Patient gave consent for procedure. Patient prepped and draped in sterile fashion. Sedation administered, patient remains easily responsive to voice. Relevant anatomy identified with ultrasound guidance. Local anesthetic given in 5cc increments with no signs or symptoms of intravascular injection. No pain or paraesthesias with injection. Patient monitored throughout procedure with signs of LAST or immediate complications. Tolerated well. Ultrasound image placed in chart.  Amalia Greenhouse, MD

## 2019-10-29 NOTE — Evaluation (Signed)
Physical Therapy Evaluation Patient Details Name: Debbie Lee MRN: [id number] DOB: 02/14/1971 Today's Date: 10/29/2019   History of Present Illness  Pt is a 49 y/o female s/p ORIF of R ankle fx. Pt sustained ankle fx after falling down icy steps. PMH includes HTN.   Clinical Impression  Pt is s/p surgery above with deficits below. Pt requiring min guard A to stand using RW and take pivotal shifts towards HOB. Pt attempted to perform "hop to" steps, however, pt with minimal foot clearance. Discussed recommendations for use of wheel chair at home and pt agreed. Will continue to follow acutely to maximize functional mobility independence and safety.      Follow Up Recommendations Home health PT;Supervision for mobility/OOB    Equipment Recommendations  Wheelchair (measurements PT);Wheelchair cushion (measurements PT);Rolling walker with 5" wheels;3in1 (PT)    Recommendations for Other Services       Precautions / Restrictions Precautions Precautions: Fall Restrictions Weight Bearing Restrictions: Yes RLE Weight Bearing: Non weight bearing      Mobility  Bed Mobility Overal bed mobility: Needs Assistance Bed Mobility: Supine to Sit;Sit to Supine     Supine to sit: Min assist Sit to supine: Min assist   General bed mobility comments: Min A for RLE management.   Transfers Overall transfer level: Needs assistance Equipment used: Rolling walker (2 wheeled) Transfers: Sit to/from Stand Sit to Stand: Min guard         General transfer comment: Min guard for steadying assist. Cues for safe hand placement. Able to maintain NWB on RLE   Ambulation/Gait Ambulation/Gait assistance: Min guard   Assistive device: Rolling walker (2 wheeled)       General Gait Details: Attempted "hop to" steps, however, pt with minimal foot clearance. Was able to take pivotal shifts on LLE towards HOB with min guard A.   Stairs            Wheelchair Mobility    Modified Rankin (Stroke  Patients Only)       Balance Overall balance assessment: Needs assistance Sitting-balance support: No upper extremity supported;Feet supported Sitting balance-Leahy Scale: Good     Standing balance support: Bilateral upper extremity supported;During functional activity Standing balance-Leahy Scale: Poor Standing balance comment: Reliant on BUE support                              Pertinent Vitals/Pain Pain Assessment: Faces Faces Pain Scale: Hurts a little bit Pain Location: RLE  Pain Descriptors / Indicators: Burning Pain Intervention(s): Limited activity within patient's tolerance;Monitored during session;Repositioned    Home Living Family/patient expects to be discharged to:: Private residence Living Arrangements: Alone Available Help at Discharge: Family Type of Home: Apartment Home Access: Level entry     Home Layout: One level Home Equipment: Crutches Additional Comments: Pt reports she plans to stay with her daughter at d/c.     Prior Function Level of Independence: Needs assistance   Gait / Transfers Assistance Needed: Pt reports her daughter was assisting her when she was walking with crutches. Reports she had 2 falls when using crutches            Hand Dominance        Extremity/Trunk Assessment   Upper Extremity Assessment Upper Extremity Assessment: Overall WFL for tasks assessed    Lower Extremity Assessment Lower Extremity Assessment: RLE deficits/detail RLE Deficits / Details: Deficits consistent with post op pain and weakness. Reports R ankle  mostly numb, however, did report some burning.     Cervical / Trunk Assessment Cervical / Trunk Assessment: Normal  Communication   Communication: No difficulties  Cognition Arousal/Alertness: Awake/alert Behavior During Therapy: WFL for tasks assessed/performed Overall Cognitive Status: Within Functional Limits for tasks assessed                                         General Comments General comments (skin integrity, edema, etc.): Educated about recommendations for WC and pt in agreeance    Exercises     Assessment/Plan    PT Assessment Patient needs continued PT services  PT Problem List Decreased strength;Decreased balance;Decreased mobility;Decreased knowledge of use of DME       PT Treatment Interventions Gait training;DME instruction;Functional mobility training;Therapeutic activities;Balance training;Therapeutic exercise;Patient/family education;Wheelchair mobility training    PT Goals (Current goals can be found in the Care Plan section)  Acute Rehab PT Goals Patient Stated Goal: to go home PT Goal Formulation: With patient Time For Goal Achievement: 11/12/19 Potential to Achieve Goals: Good    Frequency Min 5X/week   Barriers to discharge        Co-evaluation               AM-PAC PT "6 Clicks" Mobility  Outcome Measure Help needed turning from your back to your side while in a flat bed without using bedrails?: A Little Help needed moving from lying on your back to sitting on the side of a flat bed without using bedrails?: A Little Help needed moving to and from a bed to a chair (including a wheelchair)?: A Little Help needed standing up from a chair using your arms (e.g., wheelchair or bedside chair)?: A Little Help needed to walk in hospital room?: A Lot Help needed climbing 3-5 steps with a railing? : Total 6 Click Score: 15    End of Session Equipment Utilized During Treatment: Gait belt Activity Tolerance: Patient tolerated treatment well Patient left: in bed;with call bell/phone within reach Nurse Communication: Mobility status PT Visit Diagnosis: Other abnormalities of gait and mobility (R26.89);Difficulty in walking, not elsewhere classified (R26.2)    Time: 3382-5053 PT Time Calculation (min) (ACUTE ONLY): 26 min   Charges:   PT Evaluation $PT Eval Low Complexity: 1 Low PT Treatments $Therapeutic  Activity: 8-22 mins        Lou Miner, DPT  Acute Rehabilitation Services  Pager: [phone] Office: [phone]   Rudean Hitt 10/29/2019, 3:38 PM

## 2019-10-29 NOTE — Discharge Instructions (Signed)
It is very important for you to Elevate your leg - Toes above nose as much as possible to reduce pain / swelling.  Weight Bearing:  Non weight bearing affected leg.  Diet: As you were doing prior to hospitalization   Shower:  You have a splint on, leave the splint in place and keep the splint dry with a plastic bag.  Dressing:  You have a splint. Leave the splint in place and we will change your bandages during your first follow-up appointment.  You may loosen and re-apply ace wrap if it feels too tight.    Activity:  Increase activity slowly as tolerated, but follow the weight bearing instructions below.  The rules on driving is that you can not be taking narcotics while you drive, and you must feel in control of the vehicle.    To prevent constipation:  Narcotic medicines cause constipation.  Wean these as soon as is appropriate.   You may use a stool softener such as -  Colace (over the counter) 100 mg by mouth twice a day  Drink plenty of fluids (prune juice may be helpful) and high fiber foods Miralax (over the counter) for constipation as needed.    Itching:  If you experience itching with your medications, try taking only a single pain pill, or even half a pain pill at a time.  You can also use benadryl over the counter for itching or also to help with sleep.   Precautions:  If you experience chest pain or shortness of breath - call 911 immediately for transfer to the hospital emergency department!!  If you develop a fever greater that 101 F, purulent drainage from wound, increased redness or drainage from wound, or calf pain -- Call the office at [phone]                                                 Follow- Up Appointment:  Please call for an appointment to be seen in 1-2 weeks [place] - [phone]

## 2019-10-30 NOTE — Plan of Care (Signed)
Patient alert and oriented, mae's well, voiding adequate amount of urine, swallowing without difficulty, no c/o pain at time of discharge. Patient discharged home with family. Script and discharged instructions given to patient. Patient and family stated understanding of instructions given. Patient has an appointment with Dr. Murphy    

## 2019-10-30 NOTE — Discharge Summary (Signed)
Discharge Summary  Patient ID: Debbie Lee MRN: [id number] DOB/AGE: [birth year] 49 y.o.  Admit date: 10/29/2019 Discharge date: 10/30/2019  Admission Diagnoses:  Closed right ankle fracture  Discharge Diagnoses:  Principal Problem:   Closed right ankle fracture   Past Medical History:  Diagnosis Date  . Anemia   . Arthritis   . Depression   . Hypertension   . Panic attack     Surgeries: Procedure(s): OPEN REDUCTION INTERNAL FIXATION (ORIF) ANKLE FRACTURE on 10/29/2019   Consultants (if any):   Discharged Condition: Improved  Hospital Course: Debbie Lee is an 49 y.o. female who was admitted 10/29/2019 with a diagnosis of Closed right ankle fracture and went to the operating room on 10/29/2019 and underwent the above named procedures.    She was given perioperative antibiotics:  Anti-infectives (From admission, onward)   Start     Dose/Rate Route Frequency Ordered Stop   10/29/19 1600  ceFAZolin (ANCEF) IVPB 2g/100 mL premix     2 g 200 mL/hr over 30 Minutes Intravenous Every 6 hours 10/29/19 1402 10/30/19 0355   10/29/19 0830  ceFAZolin (ANCEF) 3 g in dextrose 5 % 50 mL IVPB     3 g 100 mL/hr over 30 Minutes Intravenous On call to O.R. 10/29/19 9563 10/29/19 1034    .  She was given sequential compression devices, early ambulation, and Lovenox for DVT prophylaxis while inpatient.  ASA 81 mg BID x30 days after discharge.  She was placed in observation for pain control to arrange therapy and DME.  She benefited maximally from the hospital stay and there were no complications.    Recent vital signs:  Vitals:   10/30/19 0335 10/30/19 0747  BP: (!) 141/68 (!) 147/71  Pulse: 67 63  Resp: 20 16  Temp: 98.1 F (36.7 C) 98 F (36.7 C)  SpO2: 99% 99%    Recent laboratory studies:  Lab Results  Component Value Date   HGB 12.2 10/29/2019   HGB 11.2 (L) 07/01/2015   HGB 14.3 04/14/2014   Lab Results  Component Value Date   WBC 11.2 (H) 10/29/2019   PLT 298 10/29/2019    No results found for: INR Lab Results  Component Value Date   NA 137 10/29/2019   K 3.7 10/29/2019   CL 102 10/29/2019   CO2 25 10/29/2019   BUN 12 10/29/2019   CREATININE 0.86 10/29/2019   GLUCOSE 119 (H) 10/29/2019    Discharge Medications:   Allergies as of 10/30/2019      Reactions   Codeine Other (See Comments)   "Hallucinations. Narcotics cause vomiting."   Sulfa Antibiotics Other (See Comments)   "lifelong allergy"      Medication List    STOP taking these medications   HYDROcodone-acetaminophen 5-325 MG tablet Commonly known as: NORCO/VICODIN     TAKE these medications   aspirin EC 81 MG tablet Take 1 tablet (81 mg total) by mouth 2 (two) times daily. For DVT prophylaxis for 30 days after surgery.   docusate sodium 100 MG capsule Commonly known as: Colace Take 1 capsule (100 mg total) by mouth 2 (two) times daily. To prevent constipation while taking pain medication.   ibuprofen 200 MG tablet Commonly known as: ADVIL Take 600 mg by mouth every 8 (eight) hours as needed for mild pain or moderate pain.   Implanon 68 MG Impl implant Generic drug: etonogestrel Inject 1 each into the skin once.   methocarbamol 500 MG tablet Commonly known as: Robaxin  Take 1 tablet (500 mg total) by mouth every 8 (eight) hours as needed for muscle spasms.   norgestimate-ethinyl estradiol 0.25-35 MG-MCG tablet Commonly known as: ORTHO-CYCLEN Take 1 tablet by mouth daily.   ondansetron 4 MG disintegrating tablet Commonly known as: Zofran ODT Take 1 tablet (4 mg total) by mouth every 8 (eight) hours as needed for nausea or vomiting.   oxyCODONE 5 MG immediate release tablet Commonly known as: Roxicodone Take 1 tablet (5 mg total) by mouth every 4 (four) hours as needed for up to 7 days for breakthrough pain.   propranolol 40 MG tablet Commonly known as: INDERAL Take 40 mg by mouth at bedtime.   sertraline 50 MG tablet Commonly known as: ZOLOFT Take 75 mg by mouth at  bedtime.   Turmeric 500 MG Caps Take 500 mg by mouth at bedtime.            Durable Medical Equipment  (From admission, onward)         Start     Ordered   10/29/19 1526  For home use only DME 3 n 1  Once     10/29/19 1526   10/29/19 1526  For home use only DME standard manual wheelchair with seat cushion  Once    Comments: Patient suffers from closed right ankle fracture which impairs their ability to perform daily activities like ambulating in the home.  A cane will not resolve issue with performing activities of daily living. A wheelchair will allow patient to safely perform daily activities. Patient can safely propel the wheelchair in the home or has a caregiver who can provide assistance. Length of need: 3 months Accessories: elevating leg rests (ELRs), wheel locks, extensions and anti-tippers.   10/29/19 1526   10/29/19 1524  For home use only DME Walker rolling  Once    Question Answer Comment  Walker: With 5 Inch Wheels   Patient needs a walker to treat with the following condition Closed right ankle fracture      10/29/19 1526          Diagnostic Studies: DG Tibia/Fibula Right  Result Date: 10/28/2019 CLINICAL DATA:  Fall.  Right ankle pain. EXAM: RIGHT TIBIA AND FIBULA - 2 VIEW COMPARISON:  None. FINDINGS: Partially visualized acute displaced fracture of the distal fibula. No proximal tibia or fibular fracture. 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 Bone mineralization is normal. Soft tissues are unremarkable. IMPRESSION: Partially visualized acute displaced fracture of the distal fibula. Please see separate right ankle x-rays from same day. Electronically Signed   By: Obie Dredge M.D.   On: 10/28/2019 08:53   DG Ankle 2 Views Right  Result Date: 10/28/2019 CLINICAL DATA:  Fracture reduction EXAM: RIGHT ANKLE - 2 VIEW COMPARISON:  Same day radiographs, 8:27 a.m. FINDINGS: Interval reduction of compound fracture dislocation of the right ankle. There is  improved, although persistent displacement of oblique fractures of the lateral malleolus and posterior malleolus, with persistent widening of the ankle mortise. The medial malleolus is intact. Cast material is applied about the ankle. No fracture of the included foot. Large plantar and Achilles calcaneal spurs. IMPRESSION: Interval reduction of compound fracture dislocation of the right ankle. There is improved, although persistent displacement of oblique fractures of the lateral malleolus and posterior malleolus, with persistent widening of the ankle mortise. The medial malleolus is intact. Electronically Signed   By: Lauralyn Primes M.D.   On: 10/28/2019 10:19   DG Ankle Complete Right  Result Date:  10/28/2019 CLINICAL DATA:  Fall. EXAM: RIGHT ANKLE - COMPLETE 3+ VIEW COMPARISON:  None. FINDINGS: Acute, comminuted intra-articular fracture dislocation of the right ankle. Obliquely oriented comminuted fracture through the distal fibula is noted. There is also of fracture through the posterior malleolus of the distal tibia. Posterior and lateral tibiotalar joint dislocation is identified with approximately 2.8 cm lateral displacement of the talus. IMPRESSION: 1. Acute, comminuted intra-articular fracture dislocation of the right ankle. Comminuted fractures involve the distal fibula and posterior malleolus. 2. Posterior and lateral displacement of the talus. Electronically Signed   By: Kerby Moors M.D.   On: 10/28/2019 08:55    Disposition: Discharge disposition: 01-Home or Self Care       Discharge Instructions    Discharge patient   Complete by: As directed    Discharge disposition: 01-Home or Self Care   Discharge patient date: 10/30/2019      Follow-up Information    Renette Butters, MD.   Specialty: Orthopedic Surgery Contact information: [address] [place] [address] [place] Follow up.   Why: Home Health physical  therapy:  they will call you for an appointment Contact information: Phone:  [phone]           Signed: Prudencio Burly III PA-C 10/30/2019, 7:49 AM

## 2019-10-30 NOTE — Progress Notes (Addendum)
Physical Therapy Treatment Patient Details Name: Debbie Lee MRN: [id number] DOB: 03-26-1971 Today's Date: 10/30/2019    History of Present Illness Pt is a 49 y/o female s/p ORIF of R ankle fx. Pt sustained ankle fx after falling down icy steps. PMH includes HTN.     PT Comments    Pt progressing steadily towards physical therapy goals, remains very motivated to participate. Requiring min guard assist and a walker for transfers, ambulating 5 feet with decreased left foot clearance. Reinforced use of walker for transfers, wheelchair as main mode of mobility. Pt verbalized understanding that she would likely have to progress core control and balance prior to use of a knee scooter. Education provided on open chain right lower extremity strengthening exercises. D/c plan remains appropriate.    Follow Up Recommendations  Home health PT;Supervision for mobility/OOB     Equipment Recommendations  Wheelchair (measurements PT);Wheelchair cushion (measurements PT);Rolling walker with 5" wheels;3in1 (PT)    Recommendations for Other Services       Precautions / Restrictions Precautions Precautions: Fall Restrictions Weight Bearing Restrictions: Yes RLE Weight Bearing: Non weight bearing    Mobility  Bed Mobility Overal bed mobility: Needs Assistance Bed Mobility: Supine to Sit;Sit to Supine     Supine to sit: Supervision Sit to supine: Supervision   General bed mobility comments: No physical assist required for supine <> sit; increased time/effort  Transfers Overall transfer level: Needs assistance Equipment used: Rolling walker (2 wheeled) Transfers: Sit to/from Stand Sit to Stand: Min guard         General transfer comment: min guard for safety/balance; cues for hand and foot placement  Ambulation/Gait Ambulation/Gait assistance: Min assist;Mod assist Gait Distance (Feet): 5 Feet Assistive device: Rolling walker (2 wheeled) Gait Pattern/deviations: Step-to pattern Gait  velocity: decreased   General Gait Details: Cues for sequencing, posture, midline positioning. Pt with decreased left foot clearance; requiring min-modA for stability and lifting assist. Fatigues easily    Stairs             Wheelchair Mobility    Modified Rankin (Stroke Patients Only)       Balance Overall balance assessment: Needs assistance Sitting-balance support: No upper extremity supported;Feet supported Sitting balance-Leahy Scale: Good     Standing balance support: Bilateral upper extremity supported;During functional activity Standing balance-Leahy Scale: Poor Standing balance comment: Reliant on BUE support                             Cognition Arousal/Alertness: Awake/alert Behavior During Therapy: WFL for tasks assessed/performed Overall Cognitive Status: Within Functional Limits for tasks assessed                                        Exercises General Exercises - Lower Extremity Heel Slides: Right;5 reps;Supine Hip ABduction/ADduction: Right;5 reps;Supine Straight Leg Raises: Right;5 reps;Supine    General Comments        Pertinent Vitals/Pain Pain Assessment: Faces Faces Pain Scale: Hurts little more Pain Location: L hip with weightbearing Pain Descriptors / Indicators: Grimacing Pain Intervention(s): Monitored during session    Home Living Family/patient expects to be discharged to:: Private residence Living Arrangements: Alone Available Help at Discharge: Family Type of Home: Apartment Home Access: Level entry   Home Layout: One level Home Equipment: Crutches Additional Comments: Pt reports she plans to stay with her daughter  at d/c.     Prior Function Level of Independence: Needs assistance  Gait / Transfers Assistance Needed: Pt reports her daughter was assisting her when she was walking with crutches. Reports she had 2 falls when using crutches  ADL's / Homemaking Assistance Needed: Prior to fall pt  was performing ADL tasks independently, working     PT Goals (current goals can now be found in the care plan section) Acute Rehab PT Goals Patient Stated Goal: to go home, to maintain as much independence as possible  Potential to Achieve Goals: Good Progress towards PT goals: Progressing toward goals    Frequency    Min 5X/week      PT Plan Current plan remains appropriate    Co-evaluation              AM-PAC PT "6 Clicks" Mobility   Outcome Measure  Help needed turning from your back to your side while in a flat bed without using bedrails?: None Help needed moving from lying on your back to sitting on the side of a flat bed without using bedrails?: None Help needed moving to and from a bed to a chair (including a wheelchair)?: A Little Help needed standing up from a chair using your arms (e.g., wheelchair or bedside chair)?: A Little Help needed to walk in hospital room?: A Lot Help needed climbing 3-5 steps with a railing? : Total 6 Click Score: 17    End of Session Equipment Utilized During Treatment: Gait belt Activity Tolerance: Patient tolerated treatment well Patient left: in bed;with call bell/phone within reach Nurse Communication: Mobility status PT Visit Diagnosis: Other abnormalities of gait and mobility (R26.89);Difficulty in walking, not elsewhere classified (R26.2)     Time: 1007-1219 PT Time Calculation (min) (ACUTE ONLY): 21 min  Charges:  $Gait Training: 8-22 mins                       Wyona Almas, PT, DPT Acute Rehabilitation Services Pager [phone] Office [phone]    Deno Etienne 10/30/2019, 11:07 AM

## 2019-10-30 NOTE — Progress Notes (Addendum)
    Subjective: Patient reports pain as mild, controlled.    Objective:   VITALS:   Vitals:   10/29/19 1652 10/29/19 2020 10/29/19 2317 10/30/19 0335  BP: 126/67 131/65 (!) 151/75 (!) 141/68  Pulse: 86 83 78 67  Resp: 16 18 18 20   Temp: 98.6 F (37 C) 99.3 F (37.4 C) 98 F (36.7 C) 98.1 F (36.7 C)  TempSrc: Oral Oral Oral Oral  SpO2: 94% 96% 98% 99%  Weight:      Height:       CBC Latest Ref Rng & Units 10/29/2019 07/01/2015 04/14/2014  WBC 4.0 - 10.5 K/uL 11.2(H) 10.9(H) -  Hemoglobin 12.0 - 15.0 g/dL 04/16/2014 11.2(L) 14.3  Hematocrit 36.0 - 46.0 % 40.3 33.1(L) 42.0  Platelets 150 - 400 K/uL 298 279 -   BMP Latest Ref Rng & Units 10/29/2019 04/14/2014 04/14/2014  Glucose 70 - 99 mg/dL 04/16/2014) 546(T) 035(W)  BUN 6 - 20 mg/dL 12 9 9   Creatinine 0.44 - 1.00 mg/dL 656(C 1.27  Sodium 135 - 145 mmol/L 137 141 141  Potassium 3.5 - 5.1 mmol/L 3.7 4.0 4.0  Chloride 98 - 111 mmol/L 102 104 103  CO2 22 - 32 mmol/L 25 - -  Calcium 8.9 - 10.3 mg/dL 5.17) - -   Intake/Output      02/02 0701 - 02/03 0700 02/03 0701 - 02/04 0700   P.O. 150    I.V. (mL/kg) 1650 (12.8)    IV Piggyback 50    Total Intake(mL/kg) 1850 (14.3)    Blood 10    Total Output 10    Net +1840         Urine Occurrence 1 x    Stool Occurrence 1 x       Physical Exam: General: NAD.  Supine in bed.  Calm, conversant. Resp: No increased wob Cardio: regular rate and rhythm ABD soft Neurologically intact MSK RLE: Short leg splint intact and in good condition. Sensation intact distally Toes warm, wiggling.  Calf compressible around splint.  Assessment: 1 Day Post-Op  S/P Procedure(s) (LRB): OPEN REDUCTION INTERNAL FIXATION (ORIF) ANKLE FRACTURE (Right) by Dr. 04/03. 04/04 on 10/29/2019  Active Problems:   Closed right ankle fracture   Closed right fracture, status post ORIF Doing well postop day 1 Tolerating diet voiding Evaluation by therapy-DME ordered.  OT eval pending. Fit for discharge  after therapy evaluation and DME arranged.  Plan: Mobilize with therapy Incentive Spirometry Elevate and Apply ice  Weightbearing: NWB RLE Insicional and dressing care: Dressings left intact until follow-up Orthopedic device(s): Splint Showering: Keep dressing dry VTE prophylaxis:  Lovenox while hospitalized.  Aspirin 81mg  BID 30 days, SCDs, ambulation Pain control: Current regimen.  Follow - up plan: 2 weeks in the office / as scheduled. Contact information:  Eulah Pont MD, 12/27/2019 PA-C  Dispo: Home today therapy and DME arranged.   III, PA-C 10/30/2019, 7:41 AM

## 2019-10-30 NOTE — Evaluation (Signed)
Occupational Therapy Evaluation Patient Details Name: Debbie Lee MRN: [id number] DOB: May 08, 1971 Today's Date: 10/30/2019    History of Present Illness Pt is a 49 y/o female s/p ORIF of R ankle fx. Pt sustained ankle fx after falling down icy steps. PMH includes HTN.    Clinical Impression   This 49 y/o female presents with the above. PTA pt reports independent with ADL, iADL and functional mobility. Pt presents supine in bed pleasant and willing to participate in therapy session today. Pt overall completing bed mobility and functional transfers using RW with minguard assist, able to take few hops forwards/backwards using RW. She currently requires minguard-minA for LB ADL and toileting ADL (with use of AE). Educated pt in use of AE, safety and compensatory strategies for completing ADL and functional transfers, with pt verbalizing and return demonstrating understanding. AE issued during this session. Pt reports plans to discharge to daughter's apartment (ground level). She will benefit from continued OT services while in acute setting, if pt is eligible to receive HHOT services after discharge feel she will benefit to optimize her overall safety and independence with ADL and mobility completion. Will follow.     Follow Up Recommendations  Home health OT;Supervision - Intermittent(will benefit from [facility] if able to get )    Equipment Recommendations  3 in 1 bedside commode;Wheelchair (measurements OT);Wheelchair cushion (measurements OT)(may need bariatric 3:1)           Precautions / Restrictions Precautions Precautions: Fall Restrictions Weight Bearing Restrictions: Yes RLE Weight Bearing: Non weight bearing      Mobility Bed Mobility Overal bed mobility: Needs Assistance Bed Mobility: Supine to Sit;Sit to Supine     Supine to sit: Supervision;HOB elevated Sit to supine: Min guard   General bed mobility comments: pt able to perform transitions without physical assist, minguard  for safety/RLE when returning to supine  Transfers Overall transfer level: Needs assistance Equipment used: Rolling walker (2 wheeled) Transfers: Sit to/from Stand Sit to Stand: Min guard;Supervision         General transfer comment: for general safety and balance, pt with good recall of UE placement    Balance Overall balance assessment: Needs assistance Sitting-balance support: No upper extremity supported;Feet supported Sitting balance-Leahy Scale: Good     Standing balance support: Bilateral upper extremity supported;During functional activity Standing balance-Leahy Scale: Poor Standing balance comment: Reliant on BUE support                            ADL either performed or assessed with clinical judgement   ADL Overall ADL's : Needs assistance/impaired Eating/Feeding: Independent;Sitting;Bed level   Grooming: Modified independent;Sitting   Upper Body Bathing: Set up;Sitting   Lower Body Bathing: Min guard;Minimal assistance;Sitting/lateral leans;Sit to/from stand   Upper Body Dressing : Modified independent;Sitting   Lower Body Dressing: Min guard;Minimal assistance;Sitting/lateral leans;Sit to/from stand Lower Body Dressing Details (indicate cue type and reason): educated pt in use of sock aide and reacher for LB dressing tasks, pt reports plans to likely wear skirts initially to increase ease with completing other ADL tasks, educated in option of using lateral/leans vs sit<>stand during task completion Toilet Transfer: Min guard;Minimal Administrator Details (indicate cue type and reason): simulated via transfer to/from EOB Toileting- Clothing Manipulation and Hygiene: Minimal assistance;Min guard;Sitting/lateral lean;Sit to/from stand;With adaptive equipment Toileting - Clothing Manipulation Details (indicate cue type and reason): educated in use of toilet aide for increased independence with pericare  Functional  mobility during ADLs: Min guard;Minimal assistance;Rolling walker General ADL Comments: educated pt in compensatory techniques for completing ADL/functional transfers                         Pertinent Vitals/Pain Pain Assessment: Faces Faces Pain Scale: Hurts a little bit Pain Location: RLE  Pain Descriptors / Indicators: Discomfort Pain Intervention(s): Monitored during session;Limited activity within patient's tolerance;Repositioned;Ice applied     Hand Dominance     Extremity/Trunk Assessment Upper Extremity Assessment Upper Extremity Assessment: Overall WFL for tasks assessed   Lower Extremity Assessment Lower Extremity Assessment: Defer to PT evaluation   Cervical / Trunk Assessment Cervical / Trunk Assessment: Normal   Communication Communication Communication: No difficulties   Cognition Arousal/Alertness: Awake/alert Behavior During Therapy: WFL for tasks assessed/performed Overall Cognitive Status: Within Functional Limits for tasks assessed                                     General Comments       Exercises     Shoulder Instructions      Home Living Family/patient expects to be discharged to:: Private residence Living Arrangements: Alone Available Help at Discharge: Family Type of Home: Apartment Home Access: Level entry     Home Layout: One level     Bathroom Shower/Tub: Teacher, early years/pre: Standard     Home Equipment: Crutches   Additional Comments: Pt reports she plans to stay with her daughter at d/c.       Prior Functioning/Environment Level of Independence: Needs assistance  Gait / Transfers Assistance Needed: Pt reports her daughter was assisting her when she was walking with crutches. Reports she had 2 falls when using crutches  ADL's / Homemaking Assistance Needed: Prior to fall pt was performing ADL tasks independently, working            OT Problem List: Decreased strength;Decreased  activity tolerance;Impaired balance (sitting and/or standing);Decreased knowledge of use of DME or AE;Decreased knowledge of precautions;Obesity;Pain      OT Treatment/Interventions: Self-care/ADL training;Therapeutic exercise;Energy conservation;DME and/or AE instruction;Therapeutic activities;Patient/family education;Balance training    OT Goals(Current goals can be found in the care plan section) Acute Rehab OT Goals Patient Stated Goal: to go home, to maintain as much independence as possible  OT Goal Formulation: With patient Time For Goal Achievement: 11/13/19 Potential to Achieve Goals: Good  OT Frequency: Min 2X/week   Barriers to D/C:            Co-evaluation              AM-PAC OT "6 Clicks" Daily Activity     Outcome Measure Help from another person eating meals?: None Help from another person taking care of personal grooming?: None Help from another person toileting, which includes using toliet, bedpan, or urinal?: A Lot Help from another person bathing (including washing, rinsing, drying)?: A Little Help from another person to put on and taking off regular upper body clothing?: None Help from another person to put on and taking off regular lower body clothing?: A Little 6 Click Score: 20   End of Session Equipment Utilized During Treatment: Rolling walker Nurse Communication: Mobility status  Activity Tolerance: Patient tolerated treatment well Patient left: in bed;with call bell/phone within reach  OT Visit Diagnosis: Other abnormalities of gait and mobility (R26.89);Pain;Unsteadiness on feet (R26.81) Pain - Right/Left: Right  Pain - part of body: Leg;Ankle and joints of foot                Time: 3668-1594 OT Time Calculation (min): 28 min Charges:  OT General Charges $OT Visit: 1 Visit OT Evaluation $OT Eval Moderate Complexity: 1 Mod OT Treatments $Self Care/Home Management : 8-22 mins  Marcy Siren, OT Supplemental Rehabilitation  Services Pager [phone] Office [phone]  Orlando Penner 10/30/2019, 9:54 AM

## 2019-10-30 NOTE — Op Note (Signed)
10/29/2019  7:35 AM  PATIENT:  Debbie Lee    PRE-OPERATIVE DIAGNOSIS:  RIGHT ANKLE FRACTURE  POST-OPERATIVE DIAGNOSIS:  Same  PROCEDURE:  OPEN REDUCTION INTERNAL FIXATION (ORIF) ANKLE FRACTURE  SURGEON:  Sheral Apley, MD  ASSISTANT: Aquilla Hacker, PA-C, he was present and scrubbed throughout the case, critical for completion in a timely fashion, and for retraction, instrumentation, and closure.   ANESTHESIA:   gen   PREOPERATIVE INDICATIONS:  Debbie Lee is a  49 y.o. female with a diagnosis of RIGHT ANKLE FRACTURE who failed conservative measures and elected for surgical management.    The risks benefits and alternatives were discussed with the patient preoperatively including but not limited to the risks of infection, bleeding, nerve injury, cardiopulmonary complications, the need for revision surgery, among others, and the patient was willing to proceed.  OPERATIVE IMPLANTS: stryker 1/3 tubular and tight rope  OPERATIVE FINDINGS: Unstable ankle fracture. Stable syndesmosis post op  BLOOD LOSS: min  COMPLICATIONS: none  TOURNIQUET TIME:  OPERATIVE PROCEDURE:  Patient was identified in the preoperative holding area and site was marked by me He was transported to the operating theater and placed on the table in supine position taking care to pad all bony prominences. After a preincinduction time out anesthesia was induced. The right lower extremity was prepped and draped in normal sterile fashion and a pre-incision timeout was performed. Debbie Lee received ancef for preoperative antibiotics.   I made a lateral incision of roughly 7 cm dissection was carried down sharply to the distal fibula and then spreading dissection was used proximally to protect the superficial peroneal nerve. I sharply incised the periosteum and took care to protect the peroneal tendons. I then debrided the fracture site and performed a reduction maneuver which was held in place with a clamp.    I placed a lag screw across the fracture  I then selected a 7-hole one third tubular plate and placed in a neutralization fashion care was taken distally so as not to penetrate the joint with the cancellus screws.  I then stressed the syndesmosis and it was unstable for syndesmotic fixation I performed a reduction maneuver with a clamp and placed a tight rope  I assesed the posterior mal piece and it was small enough to not require fixation as it involved less than 20% of the articular surface  The wound was then thoroughly irrigated and closed using a 0 Vicryl and absorbable Monocryl sutures. He was placed in a short leg splint.   POST OPERATIVE PLAN: Non-weightbearing. DVT prophylaxis will consist of mobilization and chemical px

## 2019-10-31 ENCOUNTER — Encounter: Payer: Self-pay | Admitting: *Deleted

## 2019-11-07 ENCOUNTER — Other Ambulatory Visit: Payer: Self-pay

## 2019-11-07 ENCOUNTER — Emergency Department (HOSPITAL_COMMUNITY)
Admission: EM | Admit: 2019-11-07 | Discharge: 2019-11-07 | Disposition: A | Discharge status: Home / Self Care | Payer: Medicaid Other | Attending: Emergency Medicine | Admitting: Emergency Medicine

## 2019-11-07 ENCOUNTER — Encounter (HOSPITAL_COMMUNITY): Payer: Self-pay | Admitting: Emergency Medicine

## 2019-11-07 DIAGNOSIS — T402X5A Adverse effect of other opioids, initial encounter: Secondary | ICD-10-CM | POA: Insufficient documentation

## 2019-11-07 DIAGNOSIS — Z981 Arthrodesis status: Secondary | ICD-10-CM | POA: Insufficient documentation

## 2019-11-07 DIAGNOSIS — K625 Hemorrhage of anus and rectum: Secondary | ICD-10-CM | POA: Insufficient documentation

## 2019-11-07 DIAGNOSIS — R110 Nausea: Secondary | ICD-10-CM | POA: Insufficient documentation

## 2019-11-07 DIAGNOSIS — I10 Essential (primary) hypertension: Secondary | ICD-10-CM | POA: Insufficient documentation

## 2019-11-07 DIAGNOSIS — Z9889 Other specified postprocedural states: Secondary | ICD-10-CM | POA: Insufficient documentation

## 2019-11-07 DIAGNOSIS — K5903 Drug induced constipation: Secondary | ICD-10-CM

## 2019-11-07 LAB — POC OCCULT BLOOD, ED: Fecal Occult Bld: POSITIVE — AB

## 2019-11-07 MED ORDER — MILK AND MOLASSES ENEMA
1.0000 | Freq: Once | RECTAL | Status: AC
  Administered 2019-11-07: 240 mL via RECTAL
  Filled 2019-11-07: qty 240

## 2019-11-07 MED ORDER — ONDANSETRON 4 MG PO TBDP
4.0000 mg | ORAL_TABLET | Freq: Once | ORAL | Status: AC
  Administered 2019-11-07: 4 mg via ORAL
  Filled 2019-11-07: qty 1

## 2019-11-07 NOTE — ED Notes (Signed)
Pt successfully had a bowel movement after administration of enema. PA made aware.

## 2019-11-07 NOTE — Discharge Instructions (Signed)
Mix 4 doses of miralax into 32 oz of a sports drink like Gatorade. Drink over 2 hours or until you have a good bowel movement. You may repeat once more if needed.   You may continue MiraLAX as needed at the dose appropriate to produce consistent nonhard stool.  You may use the counter postcoital or Dulcolax or magnesium citrate as well.  I would choose 1 of these medications to use and discontinue if you begin having regular soft stool--while continuing MiraLAX.

## 2019-11-07 NOTE — ED Triage Notes (Signed)
Patient reports constipation x1 week. States had ankle surgery last week and has been taking pain medications. C/o rectal pain and abdominal pain. Denies vomiting. Does reports nausea.

## 2019-11-07 NOTE — ED Provider Notes (Signed)
[facility] DEPT Provider Note   CSN: [id number] Arrival date & time: 11/07/19  1137     History Chief Complaint  Patient presents with  . Constipation    Debbie Lee is a 49 y.o. female.  HPI  Patient is 49 year old female who is 9 days post right ankle repair after fracture.  She was prescribed Percocet for postoperative pain which she has been taking as prescribed.  She states that she began feeling constipated the day after she was discharged from the hospital February 3.  She states that she is had small amounts of stool that she is passing since being progressively worse.  She backed off on oxycodone last couple days and only took it once or twice.  She states that she was on Colace since her surgery which has not helped her significantly.  She has tried no other over-the-counter or prescription laxatives.  She states that she has been significantly less mobile as well after her surgery.  She is trying to increase vegetable intake but is taking no fiber supplements.  She states that she has a history of hemorrhoids which seem to be more irritated recently and states that she has been having to strain her bowel movements which extremities worse.  She has the past 2 days that she has had bright red blood PR when wiping but no blood mixed in with her small amounts of stool.  Patient endorses some mild nausea but denies any vomiting, headache, dizziness, lightheadedness, urinary symptoms, burning frequency or urgency, abdominal pain, fevers or chills.     Past Medical History:  Diagnosis Date  . Anemia   . Arthritis   . Depression   . Hypertension   . Panic attack     Patient Active Problem List   Diagnosis Date Noted  . Closed right ankle fracture 10/29/2019    Past Surgical History:  Procedure Laterality Date  . NO PAST SURGERIES    . ORIF ANKLE FRACTURE Right 10/29/2019   Procedure: OPEN REDUCTION INTERNAL FIXATION (ORIF) ANKLE FRACTURE;   Surgeon: Renette Butters, MD;  Location: [facility];  Service: Orthopedics;  Laterality: Right;     OB History    Gravida  3   Para  1   Term      Preterm      AB  2   Living  1     SAB      TAB  2   Ectopic      Multiple      Live Births              Family History  Adopted: Yes    Social History   Tobacco Use  . Smoking status: Former Smoker    Years: 22.00  . Smokeless tobacco: Never Used  Substance Use Topics  . Alcohol use: Not Currently    Comment: 4/year  . Drug use: No    Home Medications Prior to Admission medications   Medication Sig Start Date End Date Taking? Authorizing Provider  aspirin EC 81 MG tablet Take 1 tablet (81 mg total) by mouth 2 (two) times daily. For DVT prophylaxis for 30 days after surgery. 10/29/19  Yes Prudencio Burly III, PA-C  docusate sodium (COLACE) 100 MG capsule Take 1 capsule (100 mg total) by mouth 2 (two) times daily. To prevent constipation while taking pain medication. Patient taking differently: Take 100 mg by mouth 2 (two) times daily as needed for mild constipation. To  prevent constipation while taking pain medication. 10/29/19  Yes Albina Billet III, PA-C  etonogestrel (IMPLANON) 68 MG IMPL implant Inject 1 each into the skin once.   Yes [provider]  ibuprofen (ADVIL) 200 MG tablet Take 600 mg by mouth every 8 (eight) hours as needed for mild pain or moderate pain.    Yes [provider]  meloxicam (MOBIC) 15 MG tablet Take 15 mg by mouth 2 (two) times daily.   Yes [provider]  methocarbamol (ROBAXIN) 500 MG tablet Take 1 tablet (500 mg total) by mouth every 8 (eight) hours as needed for muscle spasms. 10/29/19  Yes Albina Billet III, PA-C  ondansetron (ZOFRAN ODT) 4 MG disintegrating tablet Take 1 tablet (4 mg total) by mouth every 8 (eight) hours as needed for nausea or vomiting. 10/28/19  Yes Jeannie Fend, PA-C  oxyCODONE (OXY IR/ROXICODONE) 5 MG immediate  release tablet Take 5 mg by mouth every 4 (four) hours as needed for severe pain.   Yes [provider]  propranolol (INDERAL) 40 MG tablet Take 40 mg by mouth at bedtime. 06/20/19  Yes [provider]  sertraline (ZOLOFT) 50 MG tablet Take 75 mg by mouth at bedtime.    Yes [provider]  norgestimate-ethinyl estradiol (ORTHO-CYCLEN,SPRINTEC,PREVIFEM) 0.25-35 MG-MCG tablet Take 1 tablet by mouth daily. Patient not taking: Reported on 10/28/2019 07/01/15   Rasch, Victorino Dike I, NP    Allergies    Codeine and Sulfa antibiotics  Review of Systems   Review of Systems  Constitutional: Negative for chills and fever.  HENT: Negative for congestion.   Eyes: Negative for pain.  Respiratory: Negative for cough and shortness of breath.   Cardiovascular: Negative for chest pain and leg swelling.  Gastrointestinal: Positive for constipation and nausea. Negative for abdominal pain and vomiting.  Genitourinary: Negative for dysuria.  Musculoskeletal: Negative for myalgias.  Skin: Negative for rash.  Neurological: Negative for dizziness and headaches.    Physical Exam Updated Vital Signs BP 135/80   Pulse 70   Temp (!) 97.4 F (36.3 C)   Resp 16   LMP 10/21/2019   SpO2 98%   Physical Exam Vitals and nursing note reviewed.  Constitutional:      General: She is not in acute distress.    Comments: Patient is pleasant, well-appearing 48 year old female appears stated age is able to answer questions appropriately follow commands.  Does not appear to be in any acute distress.  HENT:     Head: Normocephalic and atraumatic.     Nose: Nose normal.  Eyes:     General: No scleral icterus. Cardiovascular:     Rate and Rhythm: Normal rate and regular rhythm.     Pulses: Normal pulses.     Heart sounds: Normal heart sounds.  Pulmonary:     Effort: Pulmonary effort is normal. No respiratory distress.     Breath sounds: No wheezing.  Abdominal:     Palpations: Abdomen is  soft.     Tenderness: There is no abdominal tenderness. There is no right CVA tenderness, left CVA tenderness, guarding or rebound.  Genitourinary:    Rectum: Guaiac result negative.     Comments: External hemorrhoids at 12 o'clock position.  Small amount of bright red blood from hemorrhoid present on inspection.  Stool is soft, brown, nonmelanotic and no evident.  Easily palpable hard stool in the rectal vault but I am unable to reach on digital exam.  Attached to the side unable  to gain purchase on this.  Manual disimpaction was unsuccessful. Musculoskeletal:     Cervical back: Normal range of motion.     Right lower leg: No edema.     Left lower leg: No edema.  Skin:    General: Skin is warm and dry.     Capillary Refill: Capillary refill takes less than 2 seconds.  Neurological:     Mental Status: She is alert. Mental status is at baseline.  Psychiatric:        Mood and Affect: Mood normal.        Behavior: Behavior normal.     ED Results / Procedures / Treatments   Labs (all labs ordered are listed, but only abnormal results are displayed) Labs Reviewed  POC OCCULT BLOOD, ED - Abnormal; Notable for the following components:      Result Value   Fecal Occult Bld POSITIVE (*)    All other components within normal limits    EKG None  Radiology No results found.  Procedures Fecal disimpaction  Date/Time: 11/07/2019 1:31 PM Performed by: Gailen Shelter, PA Authorized by: Gailen Shelter, PA  Consent: Verbal consent obtained. Consent given by: patient Patient understanding: patient states understanding of the procedure being performed Patient identity confirmed: verbally with patient and arm band Local anesthesia used: no  Anesthesia: Local anesthesia used: no  Sedation: Patient sedated: no  Patient tolerance: patient tolerated the procedure well with no immediate complications Comments: Patient tolerated procedure well however was unable to obtain significant  stool.    (including critical care time)  Medications Ordered in ED Medications  ondansetron (ZOFRAN-ODT) disintegrating tablet 4 mg (4 mg Oral Given 11/07/19 1329)  milk and molasses enema (240 mLs Rectal Given 11/07/19 1424)    ED Course  I have reviewed the triage vital signs and the nursing notes.  Pertinent labs & imaging results that were available during my care of the patient were reviewed by me and considered in my medical decision making (see chart for details).    MDM Rules/Calculators/A&P                      Patient is 49 year old female with opioid-induced constipation after a right ankle injury that was surgically fixed 2/2.  She states she has been constipated for approximately 1 week.  On DRE she had fecal occult positive stool which is likely because of her hemorrhoid which had some light bright red blood.  Stool is normal in appearance doubt GI bleed.  Digital disimpaction was low yield.  Patient given milk of molasses enema with good bowel movement afterwards.  She will continue his MiraLAX and magnesium citrate for the next several days and discontinue magnesium citrate when able to continue MiraLAX for approximately 1 week.  She was instructed on dosage of this.  She is well-appearing at time of discharge and understanding of plan.  Vitals are within normal limits.  She is able to tolerate p.o.  The medical records were personally reviewed by myself. I personally reviewed all lab results and interpreted all imaging studies and either concurred with their official read or contacted radiology for clarification.   This patient appears reasonably screened and I doubt any other medical condition requiring further workup, evaluation, or treatment in the ED at this time prior to discharge.   Patient's vitals are WNL apart from vital sign abnormalities discussed above, patient is in NAD, and able to ambulate in the ED at their baseline and  able to tolerate PO.  Pain has been  managed or a plan has been made for home management and has no complaints prior to discharge. Patient is comfortable with above plan and for discharge at this time. All questions were answered prior to disposition. Results from the ER workup discussed with the patient face to face and all questions answered to the best of my ability. The patient is safe for discharge with strict return precautions. Patient appears safe for discharge with appropriate follow-up. Conveyed my impression with the patient and they voiced understanding and are agreeable to plan.   An After Visit Summary was printed and given to the patient.  Portions of this note were generated with Scientist, clinical (histocompatibility and immunogenetics). Dictation errors may occur despite best attempts at proofreading.     Final Clinical Impression(s) / ED Diagnoses Final diagnoses:  Drug-induced constipation    Rx / DC Orders ED Discharge Orders    None       Gailen Shelter, [place] 11/07/19 1546    Terrilee Files, MD 11/07/19 1939

## 2020-11-16 ENCOUNTER — Other Ambulatory Visit: Payer: Self-pay

## 2020-11-16 ENCOUNTER — Encounter (HOSPITAL_COMMUNITY): Payer: Self-pay

## 2020-11-16 ENCOUNTER — Emergency Department (HOSPITAL_COMMUNITY)
Admission: EM | Admit: 2020-11-16 | Discharge: 2020-11-16 | Disposition: A | Discharge status: Home / Self Care | Payer: BC Managed Care – PPO | Attending: Emergency Medicine | Admitting: Emergency Medicine

## 2020-11-16 ENCOUNTER — Emergency Department (HOSPITAL_COMMUNITY): Payer: BC Managed Care – PPO

## 2020-11-16 DIAGNOSIS — Z79899 Other long term (current) drug therapy: Secondary | ICD-10-CM | POA: Diagnosis not present

## 2020-11-16 DIAGNOSIS — Z7982 Long term (current) use of aspirin: Secondary | ICD-10-CM | POA: Diagnosis not present

## 2020-11-16 DIAGNOSIS — R002 Palpitations: Secondary | ICD-10-CM | POA: Diagnosis present

## 2020-11-16 DIAGNOSIS — Z87891 Personal history of nicotine dependence: Secondary | ICD-10-CM | POA: Insufficient documentation

## 2020-11-16 DIAGNOSIS — I10 Essential (primary) hypertension: Secondary | ICD-10-CM | POA: Diagnosis not present

## 2020-11-16 DIAGNOSIS — I471 Supraventricular tachycardia: Secondary | ICD-10-CM

## 2020-11-16 LAB — CBC
HCT: 39.6 % (ref 36.0–46.0)
Hemoglobin: 12.4 g/dL (ref 12.0–15.0)
MCH: 27.9 pg (ref 26.0–34.0)
MCHC: 31.3 g/dL (ref 30.0–36.0)
MCV: 89.2 fL (ref 80.0–100.0)
Platelets: 330 10*3/uL (ref 150–400)
RBC: 4.44 MIL/uL (ref 3.87–5.11)
RDW: 14.1 % (ref 11.5–15.5)
WBC: 15.5 10*3/uL — ABNORMAL HIGH (ref 4.0–10.5)
nRBC: 0 % (ref 0.0–0.2)

## 2020-11-16 LAB — TROPONIN I (HIGH SENSITIVITY): Troponin I (High Sensitivity): 6 ng/L (ref <18)

## 2020-11-16 LAB — BASIC METABOLIC PANEL
Anion gap: 9 (ref 5–15)
BUN: 15 mg/dL (ref 6–20)
CO2: 28 mmol/L (ref 22–32)
Calcium: 9.3 mg/dL (ref 8.9–10.3)
Chloride: 102 mmol/L (ref 98–111)
Creatinine, Ser: 0.8 mg/dL (ref 0.44–1.00)
GFR, Estimated: >60 mL/min (ref >60)
Glucose, Bld: 136 mg/dL — ABNORMAL HIGH (ref 70–99)
Potassium: 3.6 mmol/L (ref 3.5–5.1)
Sodium: 139 mmol/L (ref 135–145)

## 2020-11-16 LAB — I-STAT BETA HCG BLOOD, ED (MC, WL, AP ONLY): I-stat hCG, quantitative: <5 m[IU]/mL (ref <5)

## 2020-11-16 MED ORDER — METOPROLOL TARTRATE 25 MG PO TABS
25.0000 mg | ORAL_TABLET | Freq: Once | ORAL | Status: AC
  Administered 2020-11-16: 25 mg via ORAL
  Filled 2020-11-16: qty 1

## 2020-11-16 NOTE — ED Provider Notes (Signed)
[facility]-EMERGENCY DEPT Provider Note   CSN: [id number] Arrival date & time: 11/16/20  1417     History Chief Complaint  Patient presents with  . Chest Pain  . Shortness of Breath    Debbie Lee is a 50 y.o. female.  For chief complaint of rapid heart rate palpitations shortness of breath.  Symptoms have been ongoing for several hours today.  She had intermittent similar symptoms over the course of last few months and she was planning to see cardiology but has not yet done so.  Denies recent illnesses no fever no vomiting no cough no diarrhea.        Past Medical History:  Diagnosis Date  . Anemia   . Arthritis   . Depression   . Hypertension   . Panic attack     Patient Active Problem List   Diagnosis Date Noted  . Closed right ankle fracture 10/29/2019    Past Surgical History:  Procedure Laterality Date  . NO PAST SURGERIES    . ORIF ANKLE FRACTURE Right 10/29/2019   Procedure: OPEN REDUCTION INTERNAL FIXATION (ORIF) ANKLE FRACTURE;  Surgeon: Sheral Apley, MD;  Location: [facility] OR;  Service: Orthopedics;  Laterality: Right;     OB History    Gravida  3   Para  1   Term      Preterm      AB  2   Living  1     SAB      IAB  2   Ectopic      Multiple      Live Births              Family History  Adopted: Yes    Social History   Tobacco Use  . Smoking status: Former Smoker    Years: 22.00  . Smokeless tobacco: Never Used  Vaping Use  . Vaping Use: Never used  Substance Use Topics  . Alcohol use: Not Currently    Comment: 4/year  . Drug use: No    Home Medications Prior to Admission medications   Medication Sig Start Date End Date Taking? Authorizing Provider  DULoxetine (CYMBALTA) 60 MG capsule Take 60 mg by mouth at bedtime. 10/22/20  Yes [provider]  propranolol (INDERAL) 40 MG tablet Take 40 mg by mouth at bedtime. 06/20/19  Yes [provider]  albuterol (VENTOLIN HFA) 108 (90  Base) MCG/ACT inhaler Inhale 2 puffs into the lungs every 6 (six) hours as needed for wheezing or shortness of breath. 06/29/20   [provider]  aspirin EC 81 MG tablet Take 1 tablet (81 mg total) by mouth 2 (two) times daily. For DVT prophylaxis for 30 days after surgery. Patient not taking: Reported on 11/16/2020 10/29/19   Albina Billet III, PA-C  docusate sodium (COLACE) 100 MG capsule Take 1 capsule (100 mg total) by mouth 2 (two) times daily. To prevent constipation while taking pain medication. Patient not taking: Reported on 11/16/2020 10/29/19   Albina Billet III, PA-C  methocarbamol (ROBAXIN) 500 MG tablet Take 1 tablet (500 mg total) by mouth every 8 (eight) hours as needed for muscle spasms. Patient not taking: Reported on 11/16/2020 10/29/19   Albina Billet III, PA-C  norgestimate-ethinyl estradiol (ORTHO-CYCLEN,SPRINTEC,PREVIFEM) 0.25-35 MG-MCG tablet Take 1 tablet by mouth daily. Patient not taking: No sig reported 07/01/15   Rasch, Victorino Dike I, NP  ondansetron (ZOFRAN ODT) 4 MG disintegrating tablet Take 1 tablet (4 mg  total) by mouth every 8 (eight) hours as needed for nausea or vomiting. Patient not taking: Reported on 11/16/2020 10/28/19   Army Melia A, PA-C    Allergies    Codeine and Sulfa antibiotics  Review of Systems   Review of Systems  Constitutional: Negative for fever.  HENT: Negative for ear pain.   Eyes: Negative for pain.  Respiratory: Negative for cough.   Cardiovascular: Positive for palpitations.  Gastrointestinal: Negative for abdominal pain.  Genitourinary: Negative for flank pain.  Musculoskeletal: Negative for back pain.  Skin: Negative for rash.  Neurological: Negative for headaches.    Physical Exam Updated Vital Signs BP (!) 150/95   Pulse 74   Temp 99.6 F (37.6 C) (Oral)   Resp (!) 31   Ht 5\' 2"  (1.575 m)   Wt 136.1 kg   SpO2 97%   BMI 54.87 kg/m   Physical Exam Constitutional:      General: She is  not in acute distress.    Appearance: Normal appearance.  HENT:     Head: Normocephalic.     Nose: Nose normal.  Eyes:     Extraocular Movements: Extraocular movements intact.  Cardiovascular:     Rate and Rhythm: Regular rhythm. Tachycardia present.  Pulmonary:     Effort: Pulmonary effort is normal.  Musculoskeletal:        General: Normal range of motion.     Cervical back: Normal range of motion.  Neurological:     General: No focal deficit present.     Mental Status: She is alert. Mental status is at baseline.     ED Results / Procedures / Treatments   Labs (all labs ordered are listed, but only abnormal results are displayed) Labs Reviewed  BASIC METABOLIC PANEL - Abnormal; Notable for the following components:      Result Value   Glucose, Bld 136 (*)    All other components within normal limits  CBC - Abnormal; Notable for the following components:   WBC 15.5 (*)    All other components within normal limits  I-STAT BETA HCG BLOOD, ED ([facility], [facility], [facility] ONLY)  TROPONIN I (HIGH SENSITIVITY)  TROPONIN I (HIGH SENSITIVITY)    EKG None  Radiology DG Chest 2 View  Result Date: 11/16/2020 CLINICAL DATA:  Chest pain. Episodes of tachycardia, shortness of breath, jaw pain, and back pain for 6 months. EXAM: CHEST - 2 VIEW COMPARISON:  04/14/2014 FINDINGS: The cardiomediastinal silhouette is within normal limits. The lungs are well inflated and clear. There is no evidence of pleural effusion or pneumothorax. No acute osseous abnormality is identified. IMPRESSION: No active cardiopulmonary disease. Electronically Signed   By: 04/16/2014 M.D.   On: 11/16/2020 15:41    Procedures .Critical Care Performed by: 11/18/2020, MD Authorized by: Cheryll Cockayne, MD   Critical care provider statement:    Critical care time (minutes):  30   Critical care time was exclusive of:  Separately billable procedures and treating other patients and teaching time   Critical care was necessary  to treat or prevent imminent or life-threatening deterioration of the following conditions:  Cardiac failure     Medications Ordered in ED Medications  metoprolol tartrate (LOPRESSOR) tablet 25 mg (25 mg Oral Given 11/16/20 1441)    ED Course  I have reviewed the triage vital signs and the nursing notes.  Pertinent labs & imaging results that were available during my care of the patient were reviewed by me and  considered in my medical decision making (see chart for details).    MDM Rules/Calculators/A&P                          Was called immediately to the patient's bedside by nursing staff.  She arrived heart rates in the 150 to 160 bpm range.  Upon review of EKG appears to be SVT.  Valsalva maneuver performed in the ER subsequent resolution of tachycardia and conversion to sinus rhythm.  Patient observed for several hours with no additional adverse events.  Given metoprolol 25 mg orally.  Continues to be comfortable with no chest pain no chest discomfort and no episodes of recurrent tachycardia.  Advised outpatient follow-up with cardiology within the week.  Advised immediate return for worsening symptoms recurrent tachycardia or any additional concerns.  Final Clinical Impression(s) / ED Diagnoses Final diagnoses:  SVT (supraventricular tachycardia) [facility])    Rx / DC Orders ED Discharge Orders    None       Cheryll Cockayne, MD 11/16/20 1558

## 2020-11-16 NOTE — Discharge Instructions (Addendum)
Call your primary care doctor or specialist as discussed in the next 2-3 days.   Return immediately back to the ER if:  Your symptoms worsen within the next 12-24 hours. You develop new symptoms such as new fevers, persistent vomiting, new pain, shortness of breath, or new weakness or numbness, or if you have any other concerns.  

## 2020-11-16 NOTE — ED Triage Notes (Signed)
Pt arrived via walk in, c/o CP, SOB, feeling palpitations. No known cardiac hx, that's she has been having these episodes on and off, but has not been able to be seen when having episode.

## 2021-01-23 ENCOUNTER — Other Ambulatory Visit: Payer: Self-pay

## 2021-01-23 ENCOUNTER — Emergency Department (HOSPITAL_COMMUNITY)
Admission: EM | Admit: 2021-01-23 | Discharge: 2021-01-23 | Disposition: A | Discharge status: Home / Self Care | Payer: BC Managed Care – PPO | Attending: Emergency Medicine | Admitting: Emergency Medicine

## 2021-01-23 ENCOUNTER — Emergency Department (HOSPITAL_COMMUNITY): Payer: BC Managed Care – PPO

## 2021-01-23 DIAGNOSIS — Z87891 Personal history of nicotine dependence: Secondary | ICD-10-CM | POA: Insufficient documentation

## 2021-01-23 DIAGNOSIS — Z79899 Other long term (current) drug therapy: Secondary | ICD-10-CM | POA: Insufficient documentation

## 2021-01-23 DIAGNOSIS — I10 Essential (primary) hypertension: Secondary | ICD-10-CM | POA: Insufficient documentation

## 2021-01-23 DIAGNOSIS — Z7982 Long term (current) use of aspirin: Secondary | ICD-10-CM | POA: Insufficient documentation

## 2021-01-23 DIAGNOSIS — R0789 Other chest pain: Secondary | ICD-10-CM | POA: Diagnosis present

## 2021-01-23 DIAGNOSIS — I471 Supraventricular tachycardia: Secondary | ICD-10-CM | POA: Insufficient documentation

## 2021-01-23 LAB — CBC WITH DIFFERENTIAL/PLATELET
Abs Immature Granulocytes: 0.04 10*3/uL (ref 0.00–0.07)
Basophils Absolute: 0.1 10*3/uL (ref 0.0–0.1)
Basophils Relative: 0 %
Eosinophils Absolute: 0.3 10*3/uL (ref 0.0–0.5)
Eosinophils Relative: 2 %
HCT: 41.8 % (ref 36.0–46.0)
Hemoglobin: 12.8 g/dL (ref 12.0–15.0)
Immature Granulocytes: 0 %
Lymphocytes Relative: 18 %
Lymphs Abs: 2.3 10*3/uL (ref 0.7–4.0)
MCH: 26.7 pg (ref 26.0–34.0)
MCHC: 30.6 g/dL (ref 30.0–36.0)
MCV: 87.1 fL (ref 80.0–100.0)
Monocytes Absolute: 0.8 10*3/uL (ref 0.1–1.0)
Monocytes Relative: 6 %
Neutro Abs: 9.4 10*3/uL — ABNORMAL HIGH (ref 1.7–7.7)
Neutrophils Relative %: 74 %
Platelets: 303 10*3/uL (ref 150–400)
RBC: 4.8 MIL/uL (ref 3.87–5.11)
RDW: 14.7 % (ref 11.5–15.5)
WBC: 12.8 10*3/uL — ABNORMAL HIGH (ref 4.0–10.5)
nRBC: 0 % (ref 0.0–0.2)

## 2021-01-23 LAB — BASIC METABOLIC PANEL
Anion gap: 10 (ref 5–15)
BUN: 14 mg/dL (ref 6–20)
CO2: 25 mmol/L (ref 22–32)
Calcium: 9.3 mg/dL (ref 8.9–10.3)
Chloride: 105 mmol/L (ref 98–111)
Creatinine, Ser: 0.82 mg/dL (ref 0.44–1.00)
GFR, Estimated: >60 mL/min (ref >60)
Glucose, Bld: 122 mg/dL — ABNORMAL HIGH (ref 70–99)
Potassium: 3.9 mmol/L (ref 3.5–5.1)
Sodium: 140 mmol/L (ref 135–145)

## 2021-01-23 LAB — I-STAT BETA HCG BLOOD, ED (MC, WL, AP ONLY): I-stat hCG, quantitative: <5 m[IU]/mL (ref <5)

## 2021-01-23 LAB — TROPONIN I (HIGH SENSITIVITY)
Troponin I (High Sensitivity): 4 ng/L (ref <18)
Troponin I (High Sensitivity): 5 ng/L (ref <18)

## 2021-01-23 MED ORDER — METOPROLOL TARTRATE 25 MG PO TABS: 25.0000 mg | ORAL_TABLET | Freq: Once | ORAL | 0 refills | Status: DC | PRN

## 2021-01-23 NOTE — ED Notes (Signed)
Pt discharged from this ED in stable condition at this time. All discharge instructions and follow up care reviewed with pt with no further questions at this time. Pt ambulatory with steady gait, clear speech.  

## 2021-01-23 NOTE — Discharge Instructions (Signed)
We saw in the ER for palpitations.  Fortunately, while here you are not in SVT.  Given that you have been having recurrent symptoms, we have given you prescription for metoprolol.  He can take it only when you are having palpitations for at least 10 minutes and not getting better.  Take the medicine and wait for another half an hour to see if you get better.  If you are not getting better then he can come to the ER.  Ensure that the top number of your BP is over 100 MMHG when taking the metoprolol.

## 2021-01-23 NOTE — ED Provider Notes (Signed)
[facility]-EMERGENCY DEPT Provider Note   CSN: [id number] Arrival date & time: 01/23/21  1418     History Chief Complaint  Patient presents with  . Chest Pain    Debbie Lee is a 50 y.o. female.  HPI    50 year old female comes in w/ chief complaint of chest pain. She has history of PSVT.  Prior to ED arrival she felt like she had chest palpitations along with some chest discomfort.  Chest discomfort was left-sided pain with radiation towards her shoulder.  She had similar symptoms with her prior palpitations at which time she was diagnosed with PSVT.  At the moment she does not have any chest pain.  Patient denies any history of premature CAD in the family.  She does have a history of hypertension, anxiety.  Patient denies any stimulant use, heavy smoking, increased caffeine intake.  Past Medical History:  Diagnosis Date  . Anemia   . Arthritis   . Depression   . Hypertension   . Panic attack     Patient Active Problem List   Diagnosis Date Noted  . Closed right ankle fracture 10/29/2019    Past Surgical History:  Procedure Laterality Date  . NO PAST SURGERIES    . ORIF ANKLE FRACTURE Right 10/29/2019   Procedure: OPEN REDUCTION INTERNAL FIXATION (ORIF) ANKLE FRACTURE;  Surgeon: Sheral Apley, MD;  Location: [facility] OR;  Service: Orthopedics;  Laterality: Right;     OB History    Gravida  3   Para  1   Term      Preterm      AB  2   Living  1     SAB      IAB  2   Ectopic      Multiple      Live Births              Family History  Adopted: Yes    Social History   Tobacco Use  . Smoking status: Former Smoker    Years: 22.00  . Smokeless tobacco: Never Used  Vaping Use  . Vaping Use: Never used  Substance Use Topics  . Alcohol use: Not Currently    Comment: 4/year  . Drug use: No    Home Medications Prior to Admission medications   Medication Sig Start Date End Date Taking? Authorizing Provider  albuterol  (VENTOLIN HFA) 108 (90 Base) MCG/ACT inhaler Inhale 2 puffs into the lungs every 6 (six) hours as needed for wheezing or shortness of breath. 06/29/20  Yes [provider]  DULoxetine (CYMBALTA) 60 MG capsule Take 60 mg by mouth at bedtime. 10/22/20  Yes [provider]  metoprolol tartrate (LOPRESSOR) 25 MG tablet Take 1 tablet (25 mg total) by mouth once as needed for up to 1 dose (FOR SEVERE PALPITATIONS WHEN SBP > 100 MMHG). 01/23/21  Yes Derwood Kaplan, MD  omeprazole (PRILOSEC) 20 MG capsule Take 20 mg by mouth daily as needed (indigestion).   Yes [provider]  propranolol (INDERAL) 40 MG tablet Take 40 mg by mouth at bedtime. 06/20/19  Yes [provider]  aspirin EC 81 MG tablet Take 1 tablet (81 mg total) by mouth 2 (two) times daily. For DVT prophylaxis for 30 days after surgery. Patient not taking: No sig reported 10/29/19   Albina Billet III, PA-C  docusate sodium (COLACE) 100 MG capsule Take 1 capsule (100 mg total) by mouth 2 (two) times daily. To prevent  constipation while taking pain medication. Patient not taking: No sig reported 10/29/19   Albina Billet III, PA-C  methocarbamol (ROBAXIN) 500 MG tablet Take 1 tablet (500 mg total) by mouth every 8 (eight) hours as needed for muscle spasms. Patient not taking: No sig reported 10/29/19   Albina Billet III, PA-C  norgestimate-ethinyl estradiol (ORTHO-CYCLEN,SPRINTEC,PREVIFEM) 0.25-35 MG-MCG tablet Take 1 tablet by mouth daily. Patient not taking: No sig reported 07/01/15   Rasch, Victorino Dike I, NP  ondansetron (ZOFRAN ODT) 4 MG disintegrating tablet Take 1 tablet (4 mg total) by mouth every 8 (eight) hours as needed for nausea or vomiting. Patient not taking: No sig reported 10/28/19   Army Melia A, PA-C    Allergies    Codeine and Sulfa antibiotics  Review of Systems   Review of Systems  Constitutional: Positive for activity change.  Respiratory: Negative for shortness of  breath.   Cardiovascular: Positive for chest pain and palpitations.  Gastrointestinal: Negative for abdominal pain.  Neurological: Negative for syncope.  All other systems reviewed and are negative.   Physical Exam Updated Vital Signs BP (!) 150/89 (BP Location: Right Arm)   Pulse 66   Temp 98.3 F (36.8 C)   Resp 16   Ht 5\' 2"  (1.575 m)   Wt 129.3 kg   SpO2 96%   BMI 52.13 kg/m   Physical Exam Constitutional:      Appearance: She is well-developed.  HENT:     Head: Normocephalic and atraumatic.  Eyes:     Pupils: Pupils are equal, round, and reactive to light.  Cardiovascular:     Rate and Rhythm: Normal rate and regular rhythm.     Heart sounds: Normal heart sounds. No murmur heard.   Pulmonary:     Effort: Pulmonary effort is normal. No respiratory distress.  Abdominal:     General: There is no distension.     Palpations: Abdomen is soft.     Tenderness: There is no abdominal tenderness. There is no guarding or rebound.  Musculoskeletal:     Cervical back: Neck supple.  Skin:    General: Skin is warm and dry.  Neurological:     Mental Status: She is alert and oriented to person, place, and time.     ED Results / Procedures / Treatments   Labs (all labs ordered are listed, but only abnormal results are displayed) Labs Reviewed  CBC WITH DIFFERENTIAL/PLATELET - Abnormal; Notable for the following components:      Result Value   WBC 12.8 (*)    Neutro Abs 9.4 (*)    All other components within normal limits  BASIC METABOLIC PANEL - Abnormal; Notable for the following components:   Glucose, Bld 122 (*)    All other components within normal limits  I-STAT BETA HCG BLOOD, ED ([facility], [facility], [facility] ONLY)  TROPONIN I (HIGH SENSITIVITY)  TROPONIN I (HIGH SENSITIVITY)    EKG EKG Interpretation  Date/Time:  Saturday January 23 2021 15:09:54 EDT Ventricular Rate:  95 PR Interval:  116 QRS Duration: 72 QT Interval:  336 QTC Calculation: 422 R Axis:   36 Text  Interpretation: Normal sinus rhythm Normal ECG No significant change since last tracing Confirmed by [id number] [phone]) on 01/24/2021 11:57:09 AM   Radiology DG Chest Portable 1 View  Result Date: 01/23/2021 CLINICAL DATA:  Chest pain EXAM: PORTABLE CHEST 1 VIEW COMPARISON:  Chest x-ray dated 11/16/2020 FINDINGS: Heart size and mediastinal contours are within normal limits. Lungs are clear.  No pleural effusion or pneumothorax is seen. Osseous structures about the chest are unremarkable. IMPRESSION: No active disease. No evidence of pneumonia or pulmonary edema. Electronically Signed   By: Bary Richard M.D.   On: 01/23/2021 15:56    Procedures Procedures   Medications Ordered in ED Medications - No data to display  ED Course  I have reviewed the triage vital signs and the nursing notes.  Pertinent labs & imaging results that were available during my care of the patient were reviewed by me and considered in my medical decision making (see chart for details).    MDM Rules/Calculators/A&P                          50 year old comes in a chief complaint of palpitations and associated chest pain.  Currently she is not in SVT and has sinus rhythm.  She was monitored for a short time in the ER and did not have any PSVT.  Patient was asymptomatic throughout her ED stay.  Most likely she went into PSVT.  She has been given metoprolol that will be taken as needed.  Troponin is negative.  EKG, chest x-ray reviewed independently and are reassuring.  Final Clinical Impression(s) / ED Diagnoses Final diagnoses:  PSVT (paroxysmal supraventricular tachycardia) (HCC)    Rx / DC Orders ED Discharge Orders         Ordered    metoprolol tartrate (LOPRESSOR) 25 MG tablet  Once PRN        01/23/21 1934           Derwood Kaplan, MD 01/26/21 1637

## 2021-01-23 NOTE — ED Triage Notes (Signed)
Patient reports she was out doing errands earlier and developed chest pain, neck pain and felt as if her heart was racing. Patient says this has happened before and has htn. Patient says she feels better now in triage. Denies pain

## 2021-01-23 NOTE — ED Triage Notes (Signed)
Emergency Medicine Provider Triage Evaluation Note  Debbie Lee , a 50 y.o. female  was evaluated in triage.  Pt complains of chest pain, palpitations, and shortness of breath that started while she was pulling in the parking lot while running errands. Has a history of SVT and was seen in February 2022 for SVT that resolved with valsalva maneuver and metoprolol 25mg . She notes chest pain radiates to neck and shoulders. She states she feels like she "ran a marathon"; however notes her symptoms have improved. No history of blood clots, recent surgeries, recent long immobilizations, or hormonal treatments  Review of Systems  Positive: Palpitations, shortness of breath, chest pain Negative: fever  Physical Exam  BP (!) 156/101 (BP Location: Left Arm)   Pulse (!) 101   Temp 98.3 F (36.8 C) (Oral)   Resp 18   Ht 5\' 2"  (1.575 m)   Wt 129.3 kg   SpO2 100%   BMI 52.13 kg/m  Gen:   Awake, no distress   HEENT:  Atraumatic  Resp:  Normal effort  Cardiac:  tachycardic Abd:   Nondistended, nontender  MSK:   Moves extremities without difficulty  Neuro:  Speech clear   Medical Decision Making  Medically screening exam initiated at 3:06 PM.  Appropriate orders placed.  was informed that the remainder of the evaluation will be completed by another provider, this initial triage assessment does not replace that evaluation, and the importance of remaining in the ED until their evaluation is complete.  Clinical Impression  Palpitations associated with chest pain and shortness of breath. History of SVT. Suspect SVT. Cardiac labs ordered.    , Debbie Lee 01/23/21 1512

## 2021-03-23 IMAGING — DX DG TIBIA/FIBULA 2V*R*
2 series · 2 of 2 positions shown · non-contrast
Comparison: None.

CLINICAL DATA: Fall.  Right ankle pain.

EXAM:
RIGHT TIBIA AND FIBULA - 2 VIEW

[tibia ap]
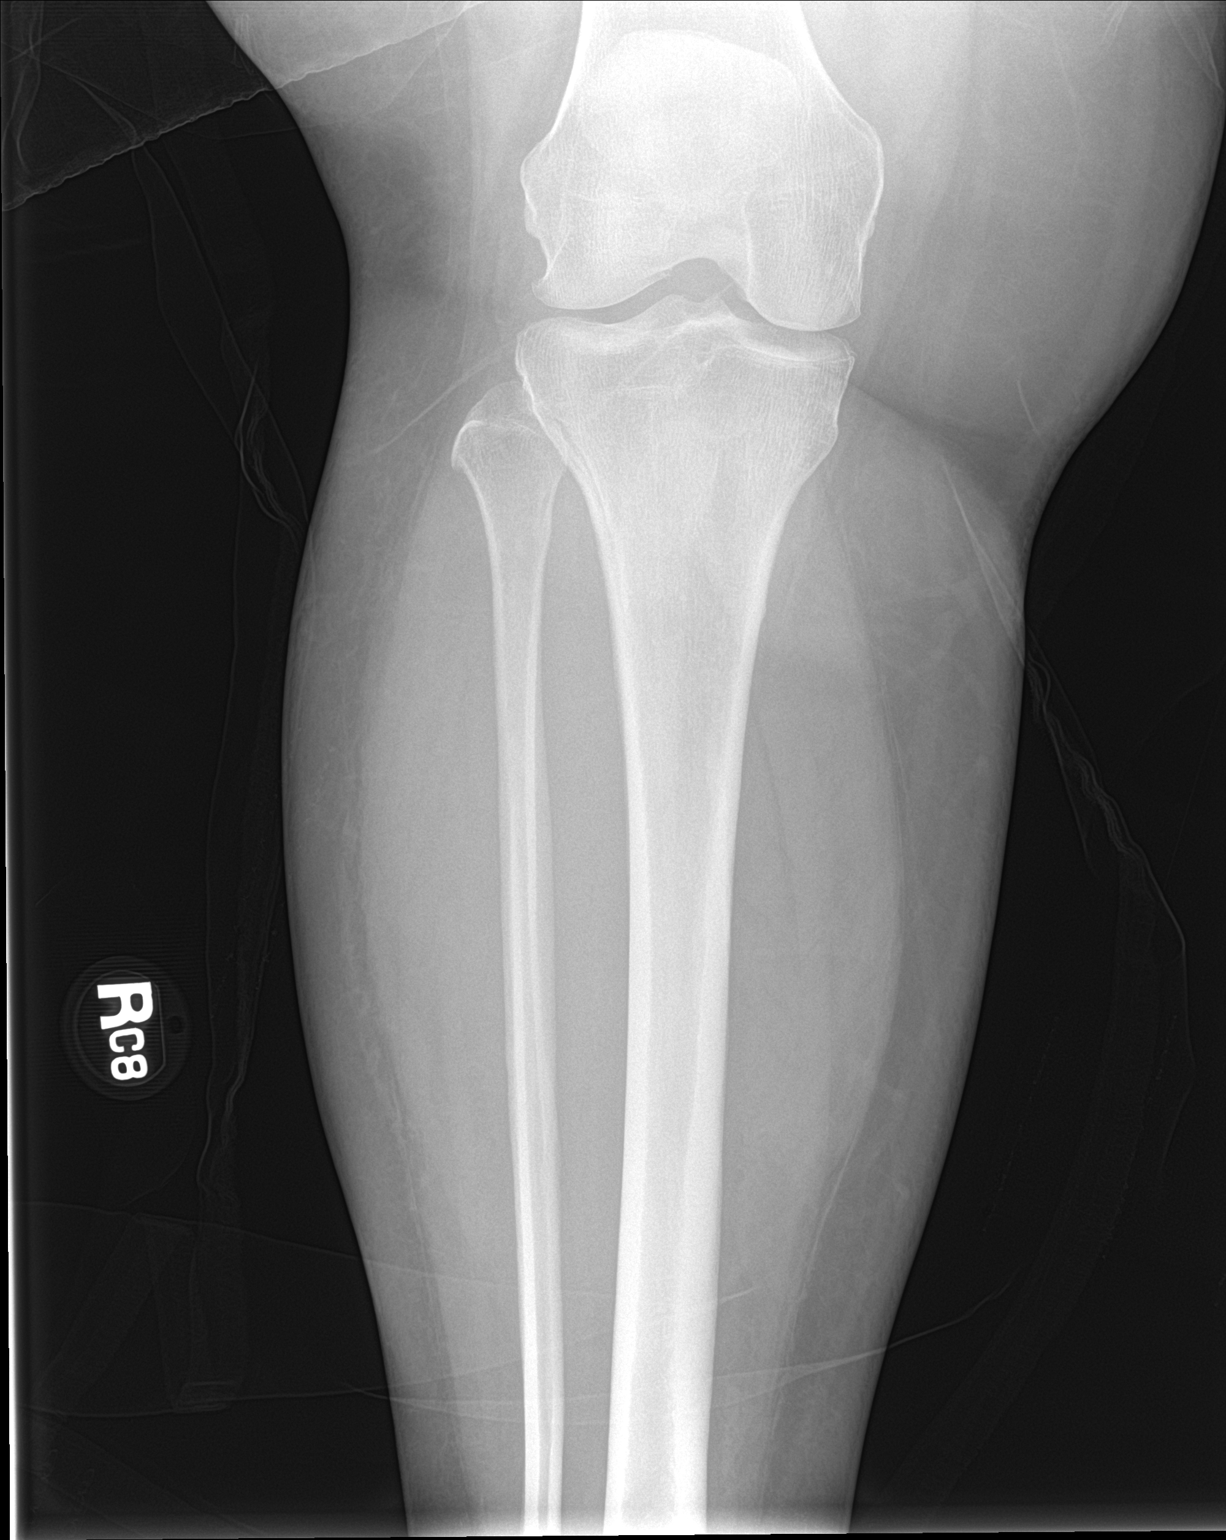

[tibia lat]
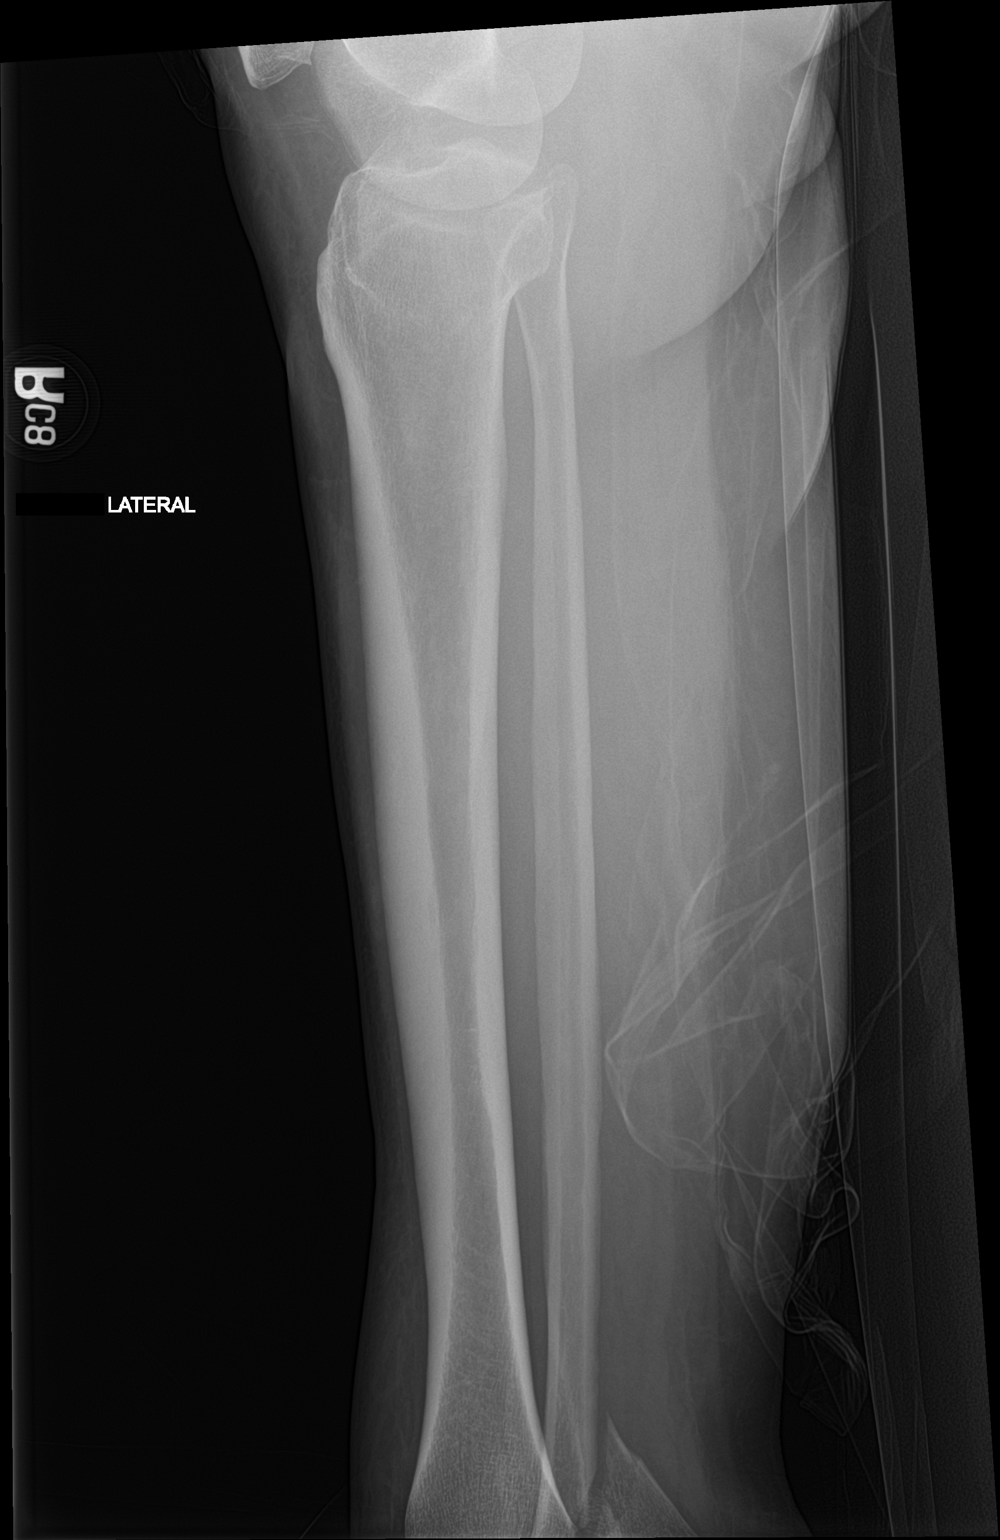

[2 of 2 positions shown; findings below may reference images not displayed]

FINDINGS: Partially visualized acute displaced fracture of the distal fibula.
No proximal tibia or fibular fracture. Mild osteoarthritis of the
knee lateral compartment. Bone mineralization is normal. Soft
tissues are unremarkable.
IMPRESSION: Partially visualized acute displaced fracture of the distal fibula.
Please see separate right ankle x-rays from same day.

## 2021-03-23 IMAGING — DX DG ANKLE 2V *R*
2 series · 2 of 2 positions shown · non-contrast
Comparison: Same day radiographs, [DATE] a.m.

CLINICAL DATA: Fracture reduction

EXAM:
RIGHT ANKLE - 2 VIEW

[ankle ap]
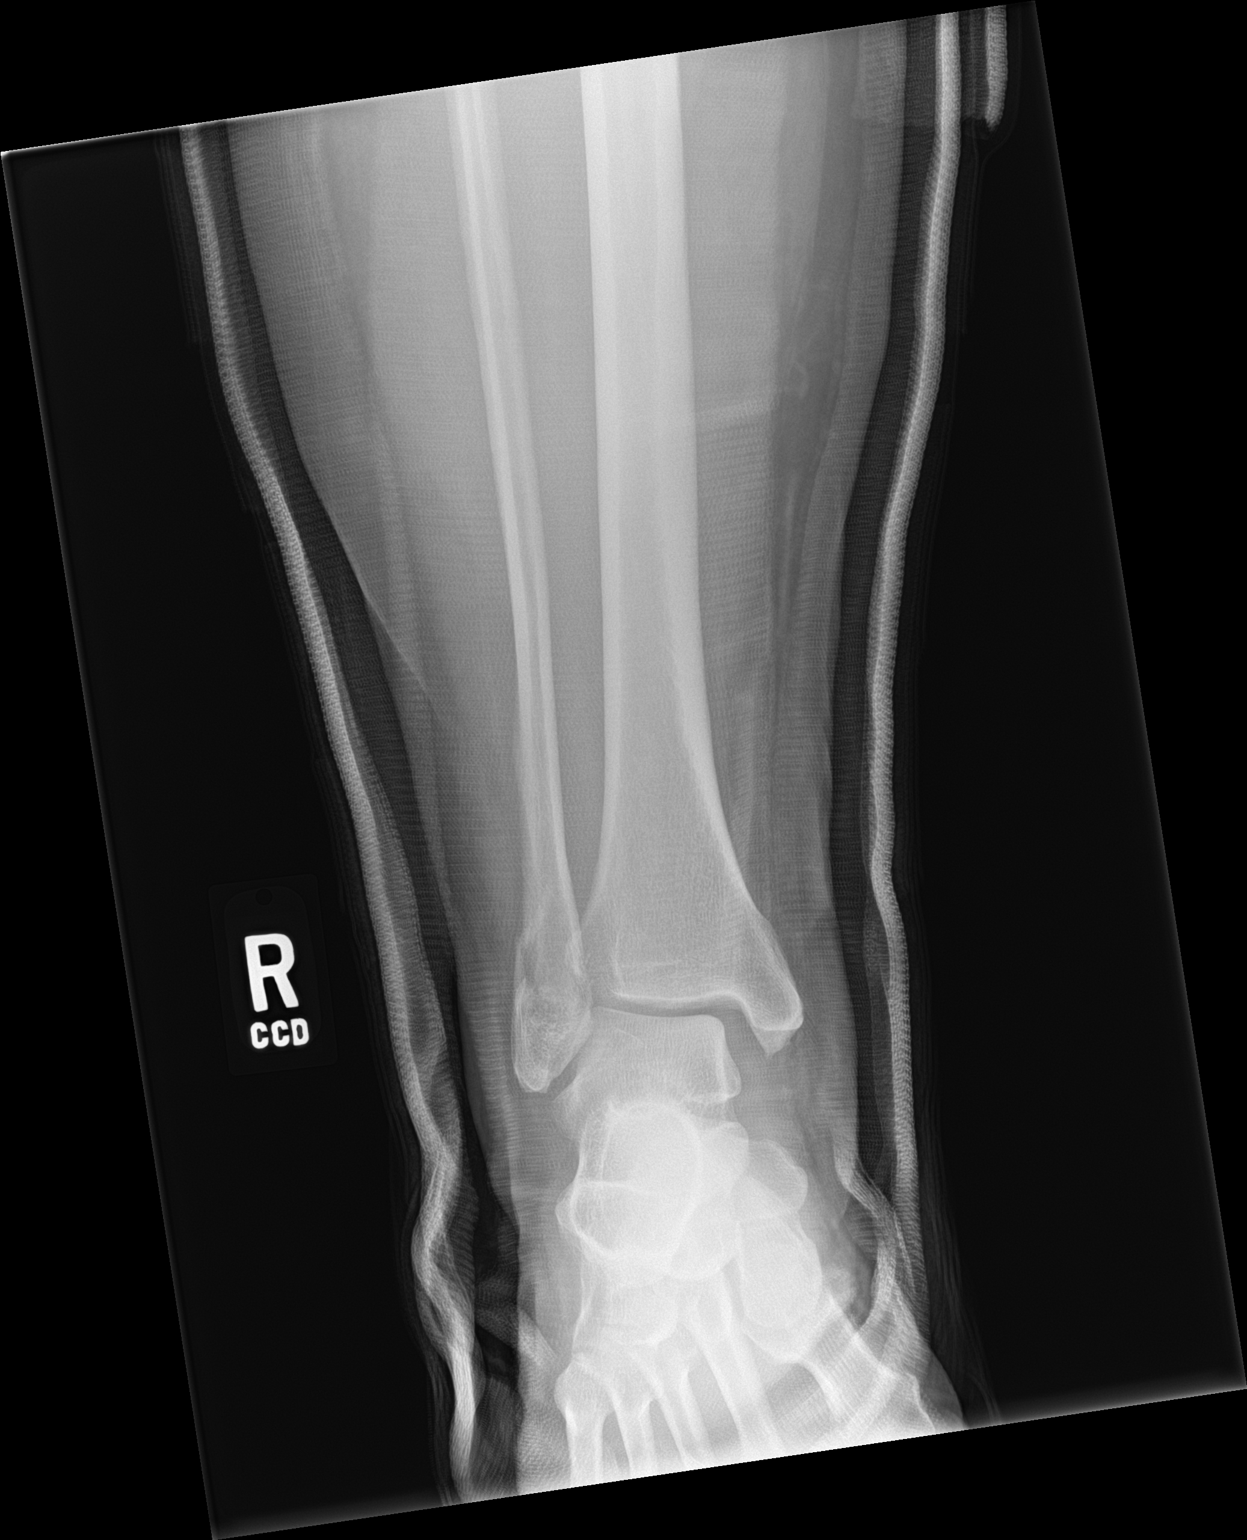

[ankle lat]
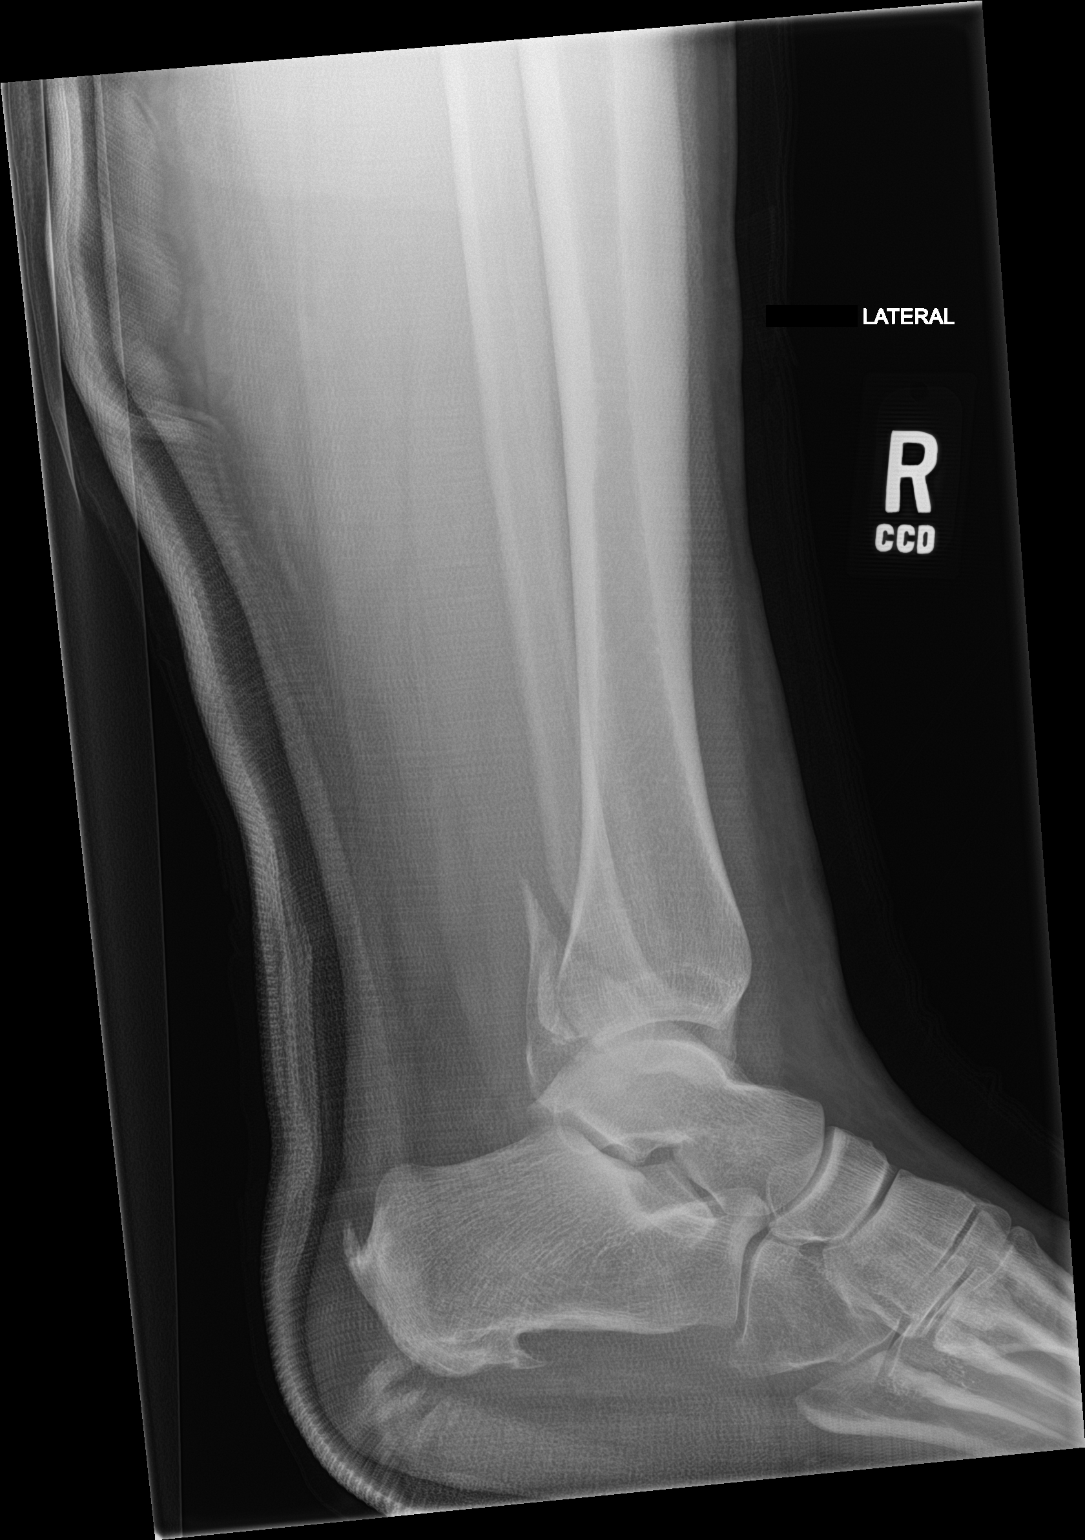

[2 of 2 positions shown; findings below may reference images not displayed]

FINDINGS: Interval reduction of compound fracture dislocation of the right
ankle. There is improved, although persistent displacement of
oblique fractures of the lateral malleolus and posterior malleolus,
with persistent widening of the ankle mortise. The medial malleolus
is intact. Cast material is applied about the ankle. No fracture of
the included foot. Large plantar and Achilles calcaneal spurs.
IMPRESSION: Interval reduction of compound fracture dislocation of the right
ankle. There is improved, although persistent displacement of
oblique fractures of the lateral malleolus and posterior malleolus,
with persistent widening of the ankle mortise. The medial malleolus
is intact.

## 2022-04-10 DIAGNOSIS — G4730 Sleep apnea, unspecified: Secondary | ICD-10-CM | POA: Diagnosis present

## 2022-05-23 ENCOUNTER — Encounter (HOSPITAL_COMMUNITY): Payer: Self-pay

## 2022-05-23 ENCOUNTER — Emergency Department (HOSPITAL_COMMUNITY)
Admission: EM | Admit: 2022-05-23 | Discharge: 2022-05-23 | Discharge status: Against Medical Advice | Payer: BLUE CROSS/BLUE SHIELD | Attending: Emergency Medicine | Admitting: Emergency Medicine

## 2022-05-23 ENCOUNTER — Emergency Department (HOSPITAL_COMMUNITY): Payer: BLUE CROSS/BLUE SHIELD

## 2022-05-23 DIAGNOSIS — R509 Fever, unspecified: Secondary | ICD-10-CM | POA: Diagnosis not present

## 2022-05-23 DIAGNOSIS — R0602 Shortness of breath: Secondary | ICD-10-CM | POA: Insufficient documentation

## 2022-05-23 DIAGNOSIS — R531 Weakness: Secondary | ICD-10-CM | POA: Diagnosis not present

## 2022-05-23 DIAGNOSIS — R059 Cough, unspecified: Secondary | ICD-10-CM | POA: Diagnosis not present

## 2022-05-23 DIAGNOSIS — Z5321 Procedure and treatment not carried out due to patient leaving prior to being seen by health care provider: Secondary | ICD-10-CM | POA: Diagnosis not present

## 2022-05-23 DIAGNOSIS — R5383 Other fatigue: Secondary | ICD-10-CM | POA: Insufficient documentation

## 2022-05-23 DIAGNOSIS — R42 Dizziness and giddiness: Secondary | ICD-10-CM | POA: Diagnosis not present

## 2022-05-23 MED ORDER — ACETAMINOPHEN 500 MG PO TABS
1000.0000 mg | ORAL_TABLET | Freq: Once | ORAL | Status: AC
  Administered 2022-05-23: 1000 mg via ORAL
  Filled 2022-05-23: qty 2

## 2022-05-23 NOTE — ED Provider Triage Note (Signed)
Emergency Medicine Provider Triage Evaluation Note  Debbie Lee , a 51 y.o. female  was evaluated in triage.  Pt complains of shortness of breath, COVID-positive test.  Patient reports that yesterday she had a COVID-positive test at home.  Patient reports that in the last 24 hours she has had increased fatigue, fever, shortness of breath.  Patient states she has history of arrhythmia, denies any pulmonary issues.  Patient denies any kidney issues.  Patient denies take any over-the-counter medications for symptoms at home.  Patient states she is concerned because she is a "fall risk".  Patient states she is been lightheaded, dizzy and weak.  Review of Systems  Positive:  Negative:   Physical Exam  BP 129/77   Pulse 70   Temp (!) 101 F (38.3 C) (Oral)   Resp 18   Ht 5\' 2"  (1.575 m)   Wt (!) 140.6 kg   SpO2 96%   BMI 56.70 kg/m  Gen:   Awake, no distress   Resp:  Normal effort  MSK:   Moves extremities without difficulty  Other:  No focal neurodeficits.  Patient lung sounds clear bilaterally  Medical Decision Making  Medically screening exam initiated at 9:03 AM.  Appropriate orders placed.  was informed that the remainder of the evaluation will be completed by another provider, this initial triage assessment does not replace that evaluation, and the importance of remaining in the ED until their evaluation is complete.     Debbie Lee, Al Decant 05/23/22 [phone]

## 2022-05-23 NOTE — ED Triage Notes (Signed)
Pt arrived via POV, tested positive for COVID yesterday, fevers, chills, non productive cough,states worsening SOB and chest pain.

## 2022-07-03 DIAGNOSIS — E1165 Type 2 diabetes mellitus with hyperglycemia: Secondary | ICD-10-CM | POA: Insufficient documentation

## 2022-07-03 DIAGNOSIS — E559 Vitamin D deficiency, unspecified: Secondary | ICD-10-CM | POA: Insufficient documentation

## 2022-07-03 DIAGNOSIS — E782 Mixed hyperlipidemia: Secondary | ICD-10-CM | POA: Diagnosis present

## 2022-07-03 DIAGNOSIS — E538 Deficiency of other specified B group vitamins: Secondary | ICD-10-CM | POA: Insufficient documentation

## 2022-07-03 DIAGNOSIS — F331 Major depressive disorder, recurrent, moderate: Secondary | ICD-10-CM | POA: Insufficient documentation

## 2022-08-15 ENCOUNTER — Emergency Department (HOSPITAL_COMMUNITY): Payer: BLUE CROSS/BLUE SHIELD

## 2022-08-15 ENCOUNTER — Emergency Department (HOSPITAL_COMMUNITY)
Admission: EM | Admit: 2022-08-15 | Discharge: 2022-08-15 | Disposition: A | Discharge status: Home / Self Care | Payer: BLUE CROSS/BLUE SHIELD | Attending: Emergency Medicine | Admitting: Emergency Medicine

## 2022-08-15 ENCOUNTER — Other Ambulatory Visit: Payer: Self-pay

## 2022-08-15 ENCOUNTER — Other Ambulatory Visit (HOSPITAL_COMMUNITY): Payer: Self-pay

## 2022-08-15 DIAGNOSIS — I4892 Unspecified atrial flutter: Secondary | ICD-10-CM | POA: Diagnosis not present

## 2022-08-15 DIAGNOSIS — R202 Paresthesia of skin: Secondary | ICD-10-CM | POA: Insufficient documentation

## 2022-08-15 DIAGNOSIS — R7989 Other specified abnormal findings of blood chemistry: Secondary | ICD-10-CM | POA: Diagnosis not present

## 2022-08-15 DIAGNOSIS — R0602 Shortness of breath: Secondary | ICD-10-CM | POA: Diagnosis present

## 2022-08-15 DIAGNOSIS — Z79899 Other long term (current) drug therapy: Secondary | ICD-10-CM | POA: Diagnosis not present

## 2022-08-15 DIAGNOSIS — R600 Localized edema: Secondary | ICD-10-CM | POA: Insufficient documentation

## 2022-08-15 DIAGNOSIS — R059 Cough, unspecified: Secondary | ICD-10-CM | POA: Insufficient documentation

## 2022-08-15 LAB — CBC
HCT: 36.1 % (ref 36.0–46.0)
Hemoglobin: 10.1 g/dL — ABNORMAL LOW (ref 12.0–15.0)
MCH: 22.5 pg — ABNORMAL LOW (ref 26.0–34.0)
MCHC: 28 g/dL — ABNORMAL LOW (ref 30.0–36.0)
MCV: 80.6 fL (ref 80.0–100.0)
Platelets: 389 10*3/uL (ref 150–400)
RBC: 4.48 MIL/uL (ref 3.87–5.11)
RDW: 17.4 % — ABNORMAL HIGH (ref 11.5–15.5)
WBC: 13.2 10*3/uL — ABNORMAL HIGH (ref 4.0–10.5)
nRBC: 0.2 % (ref 0.0–0.2)

## 2022-08-15 LAB — BASIC METABOLIC PANEL
Anion gap: 7 (ref 5–15)
BUN: 19 mg/dL (ref 6–20)
CO2: 27 mmol/L (ref 22–32)
Calcium: 8.5 mg/dL — ABNORMAL LOW (ref 8.9–10.3)
Chloride: 103 mmol/L (ref 98–111)
Creatinine, Ser: 0.94 mg/dL (ref 0.44–1.00)
GFR, Estimated: >60 mL/min (ref >60)
Glucose, Bld: 243 mg/dL — ABNORMAL HIGH (ref 70–99)
Potassium: 4.9 mmol/L (ref 3.5–5.1)
Sodium: 137 mmol/L (ref 135–145)

## 2022-08-15 LAB — I-STAT BETA HCG BLOOD, ED (MC, WL, AP ONLY): I-stat hCG, quantitative: <5 m[IU]/mL (ref <5)

## 2022-08-15 LAB — BRAIN NATRIURETIC PEPTIDE: B Natriuretic Peptide: 221.9 pg/mL — ABNORMAL HIGH (ref 0.0–100.0)

## 2022-08-15 LAB — TSH: TSH: 7.431 u[IU]/mL — ABNORMAL HIGH (ref 0.350–4.500)

## 2022-08-15 LAB — MAGNESIUM: Magnesium: 2 mg/dL (ref 1.7–2.4)

## 2022-08-15 MED ORDER — ADENOSINE 6 MG/2ML IV SOLN
INTRAVENOUS | Status: AC
  Administered 2022-08-15: 6 mg
  Filled 2022-08-15: qty 4

## 2022-08-15 MED ORDER — DILTIAZEM HCL ER COATED BEADS 180 MG PO CP24
180.0000 mg | ORAL_CAPSULE | Freq: Every day | ORAL | 1 refills | Status: DC
  Filled 2022-08-15: qty 30, 30d supply, fill #0

## 2022-08-15 MED ORDER — DILTIAZEM HCL ER COATED BEADS 180 MG PO CP24: 180.0000 mg | ORAL_CAPSULE | Freq: Every day | ORAL | 1 refills | Status: DC

## 2022-08-15 MED ORDER — DILTIAZEM HCL-DEXTROSE 125-5 MG/125ML-% IV SOLN (PREMIX)
5.0000 mg/h | INTRAVENOUS | Status: DC
  Administered 2022-08-15: 5 mg/h via INTRAVENOUS
  Filled 2022-08-15 (×2): qty 125

## 2022-08-15 MED ORDER — ADENOSINE 6 MG/2ML IV SOLN
INTRAVENOUS | Status: AC
  Administered 2022-08-15: 12 mg
  Filled 2022-08-15: qty 2

## 2022-08-15 MED ORDER — DILTIAZEM HCL ER COATED BEADS 180 MG PO CP24
180.0000 mg | ORAL_CAPSULE | Freq: Every day | ORAL | Status: DC
  Administered 2022-08-15: 180 mg via ORAL
  Filled 2022-08-15: qty 1

## 2022-08-15 MED ORDER — DILTIAZEM LOAD VIA INFUSION
20.0000 mg | Freq: Once | INTRAVENOUS | Status: AC
  Administered 2022-08-15: 20 mg via INTRAVENOUS
  Filled 2022-08-15: qty 20

## 2022-08-15 NOTE — ED Notes (Signed)
Cardizem drip maintained at 70mL/hr at this time. Pt HR 88.

## 2022-08-15 NOTE — ED Notes (Addendum)
Pt ambulated to restroom without staff assistance but did use personal walker. Pt became SOB after using restroom and took a short break by the nurses station. Pt was able to ambulate back to room without staff assistance.

## 2022-08-15 NOTE — ED Provider Notes (Signed)
[facility]-EMERGENCY DEPT Provider Note   CSN: [id number] Arrival date & time: 08/15/22  1226     History  Chief Complaint  Patient presents with   Shortness of Breath   Bloated    Debbie Lee is a 51 y.o. female.   Shortness of Breath    Patient has a history of hyper tension arthritis, depression who presents to the ED with complaints of fatigue shortness of breath.  Patient states she is noticed the symptoms for a couple of weeks.  However the symptoms have progressed over the last couple of days.  She has felt increasingly short of breath.  She gets winded with just any minimal activity including just walking short distances.  She started noticing some tingling in her fingers and toes.  She has had a nonproductive cough.  She denies any swelling.  No fevers.  Patient states she does have history of SVT in the past.  This does not feel like that.  Home Medications Prior to Admission medications   Medication Sig Start Date End Date Taking? Authorizing Provider  diltiazem (CARDIZEM CD) 180 MG 24 hr capsule Take 1 capsule (180 mg total) by mouth daily. 08/15/22  Yes Linwood Dibbles, MD  albuterol (VENTOLIN HFA) 108 (90 Base) MCG/ACT inhaler Inhale 2 puffs into the lungs every 6 (six) hours as needed for wheezing or shortness of breath. 06/29/20   [provider]  aspirin EC 81 MG tablet Take 1 tablet (81 mg total) by mouth 2 (two) times daily. For DVT prophylaxis for 30 days after surgery. Patient not taking: No sig reported 10/29/19   Albina Billet III, PA-C  docusate sodium (COLACE) 100 MG capsule Take 1 capsule (100 mg total) by mouth 2 (two) times daily. To prevent constipation while taking pain medication. Patient not taking: No sig reported 10/29/19   Albina Billet III, PA-C  DULoxetine (CYMBALTA) 60 MG capsule Take 60 mg by mouth at bedtime. 10/22/20   [provider]  methocarbamol (ROBAXIN) 500 MG tablet Take 1 tablet (500 mg  total) by mouth every 8 (eight) hours as needed for muscle spasms. Patient not taking: No sig reported 10/29/19   Albina Billet III, PA-C  metoprolol tartrate (LOPRESSOR) 25 MG tablet Take 1 tablet (25 mg total) by mouth once as needed for up to 1 dose (FOR SEVERE PALPITATIONS WHEN SBP > 100 MMHG). 01/23/21   Derwood Kaplan, MD  norgestimate-ethinyl estradiol (ORTHO-CYCLEN,SPRINTEC,PREVIFEM) 0.25-35 MG-MCG tablet Take 1 tablet by mouth daily. Patient not taking: No sig reported 07/01/15   Rasch, Victorino Dike I, NP  omeprazole (PRILOSEC) 20 MG capsule Take 20 mg by mouth daily as needed (indigestion).    [provider]  ondansetron (ZOFRAN ODT) 4 MG disintegrating tablet Take 1 tablet (4 mg total) by mouth every 8 (eight) hours as needed for nausea or vomiting. Patient not taking: No sig reported 10/28/19   Jeannie Fend, PA-C  propranolol (INDERAL) 40 MG tablet Take 40 mg by mouth at bedtime. 06/20/19   [provider]      Allergies    Codeine and Sulfa antibiotics    Review of Systems   Review of Systems  Respiratory:  Positive for shortness of breath.     Physical Exam Updated Vital Signs BP (!) 146/96   Pulse 65   Temp 99.4 F (37.4 C) (Oral)   Resp (!) 30   SpO2 93%  Physical Exam Vitals and nursing note reviewed.  Constitutional:  Appearance: She is well-developed. She is not diaphoretic.     Comments: Elevated BMI  HENT:     Head: Normocephalic and atraumatic.     Right Ear: External ear normal.     Left Ear: External ear normal.  Eyes:     General: No scleral icterus.       Right eye: No discharge.        Left eye: No discharge.     Conjunctiva/sclera: Conjunctivae normal.  Neck:     Trachea: No tracheal deviation.  Cardiovascular:     Rate and Rhythm: Regular rhythm. Tachycardia present.  Pulmonary:     Effort: Pulmonary effort is normal. No respiratory distress.     Breath sounds: Normal breath sounds. No stridor. No wheezing or rales.   Abdominal:     General: Bowel sounds are normal. There is no distension.     Palpations: Abdomen is soft.     Tenderness: There is no abdominal tenderness. There is no guarding or rebound.  Musculoskeletal:        General: No tenderness or deformity.     Cervical back: Neck supple.     Right lower leg: Edema present.     Left lower leg: Edema present.  Skin:    General: Skin is warm and dry.     Findings: No rash.  Neurological:     General: No focal deficit present.     Mental Status: She is alert.     Cranial Nerves: No cranial nerve deficit (no facial droop, extraocular movements intact, no slurred speech).     Sensory: No sensory deficit.     Motor: No abnormal muscle tone or seizure activity.     Coordination: Coordination normal.  Psychiatric:        Mood and Affect: Mood normal.     ED Results / Procedures / Treatments   Labs (all labs ordered are listed, but only abnormal results are displayed) Labs Reviewed  BASIC METABOLIC PANEL - Abnormal; Notable for the following components:      Result Value   Glucose, Bld 243 (*)    Calcium 8.5 (*)    All other components within normal limits  CBC - Abnormal; Notable for the following components:   WBC 13.2 (*)    Hemoglobin 10.1 (*)    MCH 22.5 (*)    MCHC 28.0 (*)    RDW 17.4 (*)    All other components within normal limits  TSH - Abnormal; Notable for the following components:   TSH 7.431 (*)    All other components within normal limits  BRAIN NATRIURETIC PEPTIDE - Abnormal; Notable for the following components:   B Natriuretic Peptide 221.9 (*)    All other components within normal limits  MAGNESIUM  I-STAT BETA HCG BLOOD, ED ([facility], [facility], [facility] ONLY)    EKG EKG Interpretation  Date/Time:  Monday August 15 2022 12:55:24 EST Ventricular Rate:  160 PR Interval:  183 QRS Duration: 154 QT Interval:  304 QTC Calculation: 492 R Axis:   66 Text Interpretation: Atrial flutter with 2 to 1 block Paired ventricular  premature complexes Left bundle branch block Baseline wander in lead(s) V4 V6 No significant change since last tracing Reconfirmed by Linwood Dibbles [phone]) on 08/15/2022 1:02:25 PM  Radiology DG Chest Port 1 View  Result Date: 08/15/2022 CLINICAL DATA:  Dyspnea. EXAM: PORTABLE CHEST 1 VIEW COMPARISON:  May 23, 2022. FINDINGS: The heart size and mediastinal contours are within normal limits. Both lungs  are clear. The visualized skeletal structures are unremarkable. IMPRESSION: No active disease. Electronically Signed   By: Lupita RaiderJames  Green Jr M.D.   On: 08/15/2022 13:13    Procedures Procedures    Medications Ordered in ED Medications  diltiazem (CARDIZEM) 1 mg/mL load via infusion 20 mg (20 mg Intravenous Bolus from Bag 08/15/22 1318)    And  diltiazem (CARDIZEM) 125 mg in dextrose 5% 125 mL (1 mg/mL) infusion (5 mg/hr Intravenous New Bag/Given 08/15/22 1325)  diltiazem (CARDIZEM CD) 24 hr capsule 180 mg (180 mg Oral Given 08/15/22 1547)  adenosine (ADENOCARD) 6 MG/2ML injection (12 mg  Given 08/15/22 1257)  adenosine (ADENOCARD) 6 MG/2ML injection (6 mg  Given 08/15/22 1253)    ED Course/ Medical Decision Making/ A&P Clinical Course as of 08/15/22 1552  Mon Aug 15, 2022  1305 Patient was given 6 mg of adenosine.  IV appeared to be clamped and the fluid ended up spraying out of the iv when the nurse attempted to flush.   12 mg adenosine then given.  Able to observe flutter waves.  No change in rhythm. [JK]  1435 BNP elevated at 221.  No significant metabolic abnormalities. [JK]  1435 Hemoglobin decreased to 10.1 [JK]  1435 Chest x-ray without acute findings. [JK]  1435 Heart rate has improved to 100s on Cardizem drip [JK]  1535 Case discussed with Dr Sharyn LullHarwani.  Would like to try and convert to po for her a flutter.  Follow up with him in the office. [JK]    Clinical Course User Index [JK] Linwood DibblesKnapp, Perian Tedder, MD       CHA2DS2-VASc Score: 2                    Medical Decision  Making Differential diagnosis includes but not limited to atrial flutter, supraventricular tachycardia, CHF, PE, pneumonia  Problems Addressed: Atrial flutter with rapid ventricular response (HCC): acute illness or injury that poses a threat to life or bodily functions  Amount and/or Complexity of Data Reviewed Labs: ordered. Decision-making details documented in ED Course. Radiology: ordered and independent interpretation performed.  Risk Prescription drug management.   Patient presented to ED for evaluation of shortness of breath fatigue.  Patient noted to be in new onset atrial flutter.  She was tachycardic with a heart rate in the 160s.  She was started on Cardizem drip with improvement of her heart rate.  No signs of pneumonia.  No signs of acute CHF exacerbation.  Thyroid level slightly elevated.  I did discuss the case with Dr. Sharyn LullHarwani regarding admission for her new onset atrial flutter.  He feels patient can follow-up with him as an outpatient.  I will start the patient on oral Cardizem for rate control.  Findings and plan discussed with the patient.  She is comfortable with close outpatient follow-up.        Final Clinical Impression(s) / ED Diagnoses Final diagnoses:  Atrial flutter with rapid ventricular response (HCC)    Rx / DC Orders ED Discharge Orders          Ordered    Amb referral to [facility]        08/15/22 1303    diltiazem (CARDIZEM CD) 180 MG 24 hr capsule  Daily        08/15/22 1551    Ambulatory referral to Cardiology       Comments: If you have not heard from the Cardiology office within the next 72 hours please call [phone].  08/15/22 1551              Linwood Dibbles, MD 08/15/22 1553

## 2022-08-15 NOTE — Discharge Instructions (Signed)
Take the Cardizem medication as prescribed.  Start taking an aspirin daily.  Follow-up with a cardiologist for further evaluation.

## 2022-08-15 NOTE — ED Triage Notes (Signed)
Pt arrived POV. C/o SOB worsening over past few days. Reports some tingling in fingers and toes. Non -productive cough.  Pt in SVT 160s.

## 2022-11-13 ENCOUNTER — Emergency Department (HOSPITAL_COMMUNITY): Payer: BLUE CROSS/BLUE SHIELD

## 2022-11-13 ENCOUNTER — Observation Stay (HOSPITAL_COMMUNITY): Payer: BLUE CROSS/BLUE SHIELD

## 2022-11-13 ENCOUNTER — Encounter (HOSPITAL_COMMUNITY): Payer: Self-pay

## 2022-11-13 ENCOUNTER — Other Ambulatory Visit: Payer: Self-pay

## 2022-11-13 ENCOUNTER — Inpatient Hospital Stay (HOSPITAL_COMMUNITY)
Admission: EM | Admit: 2022-11-13 | Discharge: 2022-11-20 | DRG: 308 | Disposition: A | Discharge status: Home / Self Care | Payer: BLUE CROSS/BLUE SHIELD | Attending: Internal Medicine | Admitting: Internal Medicine

## 2022-11-13 DIAGNOSIS — D509 Iron deficiency anemia, unspecified: Secondary | ICD-10-CM

## 2022-11-13 DIAGNOSIS — Z6841 Body Mass Index (BMI) 40.0 and over, adult: Secondary | ICD-10-CM

## 2022-11-13 DIAGNOSIS — R946 Abnormal results of thyroid function studies: Secondary | ICD-10-CM | POA: Diagnosis present

## 2022-11-13 DIAGNOSIS — R195 Other fecal abnormalities: Secondary | ICD-10-CM | POA: Diagnosis present

## 2022-11-13 DIAGNOSIS — K5900 Constipation, unspecified: Secondary | ICD-10-CM | POA: Diagnosis not present

## 2022-11-13 DIAGNOSIS — E119 Type 2 diabetes mellitus without complications: Secondary | ICD-10-CM

## 2022-11-13 DIAGNOSIS — E66813 Obesity, class 3: Secondary | ICD-10-CM | POA: Diagnosis present

## 2022-11-13 DIAGNOSIS — I4892 Unspecified atrial flutter: Principal | ICD-10-CM | POA: Diagnosis present

## 2022-11-13 DIAGNOSIS — K625 Hemorrhage of anus and rectum: Secondary | ICD-10-CM | POA: Diagnosis present

## 2022-11-13 DIAGNOSIS — I4891 Unspecified atrial fibrillation: Secondary | ICD-10-CM | POA: Diagnosis present

## 2022-11-13 DIAGNOSIS — Z885 Allergy status to narcotic agent status: Secondary | ICD-10-CM

## 2022-11-13 DIAGNOSIS — Z23 Encounter for immunization: Secondary | ICD-10-CM

## 2022-11-13 DIAGNOSIS — F32A Depression, unspecified: Secondary | ICD-10-CM | POA: Diagnosis present

## 2022-11-13 DIAGNOSIS — J45909 Unspecified asthma, uncomplicated: Secondary | ICD-10-CM | POA: Diagnosis present

## 2022-11-13 DIAGNOSIS — E1165 Type 2 diabetes mellitus with hyperglycemia: Secondary | ICD-10-CM | POA: Diagnosis present

## 2022-11-13 DIAGNOSIS — Z79899 Other long term (current) drug therapy: Secondary | ICD-10-CM

## 2022-11-13 DIAGNOSIS — Z87891 Personal history of nicotine dependence: Secondary | ICD-10-CM

## 2022-11-13 DIAGNOSIS — I110 Hypertensive heart disease with heart failure: Secondary | ICD-10-CM | POA: Diagnosis present

## 2022-11-13 DIAGNOSIS — I10 Essential (primary) hypertension: Secondary | ICD-10-CM | POA: Diagnosis present

## 2022-11-13 DIAGNOSIS — G4733 Obstructive sleep apnea (adult) (pediatric): Secondary | ICD-10-CM | POA: Diagnosis present

## 2022-11-13 DIAGNOSIS — I5021 Acute systolic (congestive) heart failure: Secondary | ICD-10-CM | POA: Diagnosis present

## 2022-11-13 DIAGNOSIS — I420 Dilated cardiomyopathy: Secondary | ICD-10-CM | POA: Diagnosis present

## 2022-11-13 DIAGNOSIS — Q2112 Patent foramen ovale: Secondary | ICD-10-CM

## 2022-11-13 DIAGNOSIS — D6859 Other primary thrombophilia: Secondary | ICD-10-CM | POA: Diagnosis present

## 2022-11-13 DIAGNOSIS — D72829 Elevated white blood cell count, unspecified: Secondary | ICD-10-CM | POA: Diagnosis present

## 2022-11-13 DIAGNOSIS — F410 Panic disorder [episodic paroxysmal anxiety] without agoraphobia: Secondary | ICD-10-CM | POA: Diagnosis present

## 2022-11-13 DIAGNOSIS — R7989 Other specified abnormal findings of blood chemistry: Secondary | ICD-10-CM | POA: Diagnosis present

## 2022-11-13 DIAGNOSIS — K760 Fatty (change of) liver, not elsewhere classified: Secondary | ICD-10-CM | POA: Diagnosis present

## 2022-11-13 DIAGNOSIS — K649 Unspecified hemorrhoids: Secondary | ICD-10-CM | POA: Diagnosis present

## 2022-11-13 DIAGNOSIS — D649 Anemia, unspecified: Secondary | ICD-10-CM | POA: Diagnosis not present

## 2022-11-13 DIAGNOSIS — K219 Gastro-esophageal reflux disease without esophagitis: Secondary | ICD-10-CM | POA: Diagnosis present

## 2022-11-13 DIAGNOSIS — Z7982 Long term (current) use of aspirin: Secondary | ICD-10-CM

## 2022-11-13 DIAGNOSIS — Z1152 Encounter for screening for COVID-19: Secondary | ICD-10-CM

## 2022-11-13 DIAGNOSIS — R0902 Hypoxemia: Secondary | ICD-10-CM | POA: Diagnosis not present

## 2022-11-13 DIAGNOSIS — Z882 Allergy status to sulfonamides status: Secondary | ICD-10-CM

## 2022-11-13 LAB — CBC WITH DIFFERENTIAL/PLATELET
Abs Immature Granulocytes: 0.05 10*3/uL (ref 0.00–0.07)
Basophils Absolute: 0 10*3/uL (ref 0.0–0.1)
Basophils Relative: 0 %
Eosinophils Absolute: 0.2 10*3/uL (ref 0.0–0.5)
Eosinophils Relative: 2 %
HCT: 32.9 % — ABNORMAL LOW (ref 36.0–46.0)
Hemoglobin: 8.8 g/dL — ABNORMAL LOW (ref 12.0–15.0)
Immature Granulocytes: 1 %
Lymphocytes Relative: 18 %
Lymphs Abs: 2 10*3/uL (ref 0.7–4.0)
MCH: 20.2 pg — ABNORMAL LOW (ref 26.0–34.0)
MCHC: 26.7 g/dL — ABNORMAL LOW (ref 30.0–36.0)
MCV: 75.5 fL — ABNORMAL LOW (ref 80.0–100.0)
Monocytes Absolute: 0.5 10*3/uL (ref 0.1–1.0)
Monocytes Relative: 5 %
Neutro Abs: 8.3 10*3/uL — ABNORMAL HIGH (ref 1.7–7.7)
Neutrophils Relative %: 74 %
Platelets: 335 10*3/uL (ref 150–400)
RBC: 4.36 MIL/uL (ref 3.87–5.11)
RDW: 19.3 % — ABNORMAL HIGH (ref 11.5–15.5)
WBC: 11.1 10*3/uL — ABNORMAL HIGH (ref 4.0–10.5)
nRBC: 0.2 % (ref 0.0–0.2)

## 2022-11-13 LAB — BASIC METABOLIC PANEL
Anion gap: 10 (ref 5–15)
BUN: 15 mg/dL (ref 6–20)
CO2: 25 mmol/L (ref 22–32)
Calcium: 8.6 mg/dL — ABNORMAL LOW (ref 8.9–10.3)
Chloride: 103 mmol/L (ref 98–111)
Creatinine, Ser: 1.01 mg/dL — ABNORMAL HIGH (ref 0.44–1.00)
GFR, Estimated: >60 mL/min (ref >60)
Glucose, Bld: 192 mg/dL — ABNORMAL HIGH (ref 70–99)
Potassium: 4.1 mmol/L (ref 3.5–5.1)
Sodium: 138 mmol/L (ref 135–145)

## 2022-11-13 LAB — APTT: aPTT: 29 s (ref 24–36)

## 2022-11-13 LAB — HCG, QUANTITATIVE, PREGNANCY: hCG, Beta Chain, Quant, S: <1 m[IU]/mL (ref <5)

## 2022-11-13 LAB — PROTIME-INR
INR: 1.4 — ABNORMAL HIGH (ref 0.8–1.2)
Prothrombin Time: 17.5 seconds — ABNORMAL HIGH (ref 11.4–15.2)

## 2022-11-13 LAB — TROPONIN I (HIGH SENSITIVITY)
Troponin I (High Sensitivity): 4 ng/L (ref <18)
Troponin I (High Sensitivity): 5 ng/L (ref <18)

## 2022-11-13 LAB — RESP PANEL BY RT-PCR (RSV, FLU A&B, COVID)  RVPGX2
Influenza A by PCR: NEGATIVE
Influenza B by PCR: NEGATIVE
Resp Syncytial Virus by PCR: NEGATIVE
SARS Coronavirus 2 by RT PCR: NEGATIVE

## 2022-11-13 LAB — IRON AND TIBC
Iron: 29 ug/dL (ref 28–170)
Saturation Ratios: 7 % — ABNORMAL LOW (ref 10.4–31.8)
TIBC: 431 ug/dL (ref 250–450)
UIBC: 402 ug/dL

## 2022-11-13 LAB — FERRITIN: Ferritin: 14 ng/mL (ref 11–307)

## 2022-11-13 LAB — BRAIN NATRIURETIC PEPTIDE: B Natriuretic Peptide: 228.6 pg/mL — ABNORMAL HIGH (ref 0.0–100.0)

## 2022-11-13 LAB — D-DIMER, QUANTITATIVE: D-Dimer, Quant: 1.21 ug/mL-FEU — ABNORMAL HIGH (ref 0.00–0.50)

## 2022-11-13 LAB — MAGNESIUM: Magnesium: 1.9 mg/dL (ref 1.7–2.4)

## 2022-11-13 LAB — TSH: TSH: 6.845 u[IU]/mL — ABNORMAL HIGH (ref 0.350–4.500)

## 2022-11-13 MED ORDER — IOHEXOL 350 MG/ML SOLN
100.0000 mL | Freq: Once | INTRAVENOUS | Status: AC | PRN
  Administered 2022-11-13: 100 mL via INTRAVENOUS

## 2022-11-13 MED ORDER — DILTIAZEM HCL 25 MG/5ML IV SOLN
10.0000 mg | Freq: Once | INTRAVENOUS | Status: AC
  Administered 2022-11-13: 10 mg via INTRAVENOUS
  Filled 2022-11-13: qty 5

## 2022-11-13 MED ORDER — PROPRANOLOL HCL 20 MG PO TABS
40.0000 mg | ORAL_TABLET | Freq: Two times a day (BID) | ORAL | Status: DC
  Administered 2022-11-14 – 2022-11-15 (×4): 40 mg via ORAL
  Filled 2022-11-13 (×4): qty 2

## 2022-11-13 MED ORDER — HEPARIN (PORCINE) 25000 UT/250ML-% IV SOLN
1800.0000 [IU]/h | INTRAVENOUS | Status: AC
  Administered 2022-11-13: 1300 [IU]/h via INTRAVENOUS
  Administered 2022-11-14: 1600 [IU]/h via INTRAVENOUS
  Filled 2022-11-13 (×2): qty 250

## 2022-11-13 MED ORDER — HEPARIN BOLUS VIA INFUSION
4000.0000 [IU] | INTRAVENOUS | Status: AC
  Administered 2022-11-13: 4000 [IU] via INTRAVENOUS
  Filled 2022-11-13: qty 4000

## 2022-11-13 MED ORDER — INSULIN ASPART 100 UNIT/ML IJ SOLN
0.0000 [IU] | INTRAMUSCULAR | Status: DC
  Administered 2022-11-14 (×4): 2 [IU] via SUBCUTANEOUS
  Administered 2022-11-14: 3 [IU] via SUBCUTANEOUS
  Administered 2022-11-14: 2 [IU] via SUBCUTANEOUS
  Administered 2022-11-14: 3 [IU] via SUBCUTANEOUS
  Administered 2022-11-15: 1 [IU] via SUBCUTANEOUS
  Administered 2022-11-15 (×3): 2 [IU] via SUBCUTANEOUS
  Administered 2022-11-16 (×2): 3 [IU] via SUBCUTANEOUS
  Administered 2022-11-16: 2 [IU] via SUBCUTANEOUS
  Administered 2022-11-16: 3 [IU] via SUBCUTANEOUS
  Administered 2022-11-16: 2 [IU] via SUBCUTANEOUS
  Administered 2022-11-16 – 2022-11-17 (×3): 3 [IU] via SUBCUTANEOUS
  Administered 2022-11-17 (×2): 5 [IU] via SUBCUTANEOUS
  Filled 2022-11-13: qty 0.09

## 2022-11-13 MED ORDER — DILTIAZEM HCL-DEXTROSE 125-5 MG/125ML-% IV SOLN (PREMIX)
5.0000 mg/h | INTRAVENOUS | Status: DC
  Administered 2022-11-13: 5 mg/h via INTRAVENOUS
  Administered 2022-11-14 – 2022-11-15 (×3): 12.5 mg/h via INTRAVENOUS
  Filled 2022-11-13 (×5): qty 125

## 2022-11-13 MED ORDER — ADENOSINE 6 MG/2ML IV SOLN
INTRAVENOUS | Status: AC
  Filled 2022-11-13: qty 6

## 2022-11-13 MED ORDER — DILTIAZEM HCL ER COATED BEADS 180 MG PO CP24
180.0000 mg | ORAL_CAPSULE | Freq: Once | ORAL | Status: AC
  Administered 2022-11-13: 180 mg via ORAL
  Filled 2022-11-13: qty 1

## 2022-11-13 NOTE — H&P (Signed)
Jones Skene [id number] DOB: 1971-07-09 DOA: 11/13/2022     PCP: Azzie Glatter, FNP   Outpatient Specialists:  NONE    Patient arrived to ER on 11/13/22 at 1710 Referred by Attending Regan Lemming, MD   Patient coming from:    home Lives alone,        Chief Complaint:   Chief Complaint  Patient presents with   Shortness of Breath    HPI: Debbie Lee is a 52 y.o. female with medical history significant of a flutter not on anticoagulation    Presented with shortness of breath Presented with SOB for the past 2 days but has gotten worse today no chest pain she has known history of of SVT and a flutter not on anticoagulation because initially her CHA2DS2-VASc score was only 2 since then she has been diagnosed with diabetes increasing her [place] Vascor up to 3 but she has not followed up with cardiology and has ran out of Cardizem. No prior history of DVT or PE Patient denies drinking or smoking.  She has significant hemorrhoids but think most of her hemorrhoids have been higher up but she does have occasional bleeding She no longer has any menstrual periods Reports she has been always anemic all her life but never had to get blood transfusions.  She is agreeable to have blood administered if needed. Reports although she has no history of sleep apnea but everybody tells her that she probably does because she snores.  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 She never had a colonoscopy Patient does use propranolol for blood pressure control and has taken it in the past together with Cardizem until she ran out of Cardizem tolerated this combination well Denies any chest pain   Initial COVID TEST  NEGATIVE   Lab Results  Component Value Date   [place] 11/13/2022   [place] NEGATIVE 10/28/2019     Regarding pertinent Chronic problems:        DM 2 -   diet controlled  Hypertension on  propranolol   Morbid obesity-   BMI Readings from Last 1 Encounters:  11/13/22 56.70 kg/m       Asthma -well   controlled on home inhalers/ nebs f                              A. Fib -  - CHA2DS2 vas score 3 Has not been on anticoagulation previously will start     -  Rate control: Has been prescribed diltiazem but ran out Has been taking propranolol 40 mg twice daily    While in ER:  Found to have white blood cell count of 11.1 hemoglobin down to 8.8 TSH slightly elevated 6.845 Was found to be in a flutter was given Cardizem 10 mg injection and 180 mg  Initially cardiology recommended attempted to transition to p.o. diltiazem and discharged home with patient rebound back with a flutter of A-fib with RVR rates in the 150s started on diltiazem drip and heparin drip  Ordered   CXR - NON acute   CTA chest - ordered  Following Medications were ordered in ER: Medications  adenosine (ADENOCARD) 6 MG/2ML injection (  Not Given 11/13/22 1820)  diltiazem (CARDIZEM) 125 mg in dextrose 5% 125 mL (1 mg/mL) infusion (15 mg/hr Intravenous Rate/Dose Change 11/13/22 2136)  heparin bolus  via infusion 4,000 Units (4,000 Units Intravenous Bolus from Bag 11/13/22 2142)    Followed by  heparin ADULT infusion 100 units/mL (25000 units/25m) (1,300 Units/hr Intravenous New Bag/Given 11/13/22 2140)  diltiazem (CARDIZEM) injection 10 mg (10 mg Intravenous Given 11/13/22 1829)  diltiazem (CARDIZEM CD) 24 hr capsule 180 mg (180 mg Oral Given 11/13/22 1925)  iohexol (OMNIPAQUE) 350 MG/ML injection 100 mL (100 mLs Intravenous Contrast Given 11/13/22 2249)    _______________________________________________________ ER Provider Called:   Cardiology initially recommended trying to convert to p.o. Cardizem and discharged home now that patient is being admitted ER will repaged to let them know for cardiology to follow along  ED Triage Vitals  Enc Vitals Group     BP 11/13/22 1720 (!) 157/138     Pulse Rate  11/13/22 1720 (!) 156     Resp 11/13/22 1720 16     Temp 11/13/22 1720 98 F (36.7 C)     Temp Source 11/13/22 1926 Oral     SpO2 11/13/22 1720 99 %     Weight 11/13/22 1721 (!) 310 lb (140.6 kg)     Height 11/13/22 1720 5' 2"$  (1.575 m)     Head Circumference --      Peak Flow --      Pain Score 11/13/22 1720 0     Pain Loc --      Pain Edu? --      Excl. in [place] --   TMAX(24)@     _________________________________________ Significant initial  Findings: Abnormal Labs Reviewed  CBC WITH DIFFERENTIAL/PLATELET - Abnormal; Notable for the following components:      Result Value   WBC 11.1 (*)    Hemoglobin 8.8 (*)    HCT 32.9 (*)    MCV 75.5 (*)    MCH 20.2 (*)    MCHC 26.7 (*)    RDW 19.3 (*)    Neutro Abs 8.3 (*)    All other components within normal limits  BASIC METABOLIC PANEL - Abnormal; Notable for the following components:   Glucose, Bld 192 (*)    Creatinine, Ser 1.01 (*)    Calcium 8.6 (*)    All other components within normal limits  TSH - Abnormal; Notable for the following components:   TSH 6.845 (*)    All other components within normal limits  BRAIN NATRIURETIC PEPTIDE - Abnormal; Notable for the following components:   B Natriuretic Peptide 228.6 (*)    All other components within normal limits  D-DIMER, QUANTITATIVE - Abnormal; Notable for the following components:   D-Dimer, Quant 1.21 (*)    All other components within normal limits  IRON AND TIBC - Abnormal; Notable for the following components:   Saturation Ratios 7 (*)    All other components within normal limits  PROTIME-INR - Abnormal; Notable for the following components:   Prothrombin Time 17.5 (*)    INR 1.4 (*)    All other components within normal limits     _________________________ Troponin 5  ECG: Ordered Personally reviewed and interpreted by me showing: HR : 153 Rhythm: A flutter with 2-1 conduction with rapid ventricular response  QTC 446   The recent clinical data is shown  below. Vitals:   11/13/22 2116 11/13/22 2130 11/13/22 2215 11/13/22 2224  BP: (!) 143/93 (!) 153/109 (!) 127/108   Pulse: (!) 135 (!) 130 (!) 124 (!) 134  Resp: 18 (!) 23 (!) 24 (!) 34  Temp:      TempSrc:  SpO2: 99% 99% 98% 99%  Weight:      Height:        WBC     Component Value Date/Time   WBC 11.1 (H) 11/13/2022 1758   LYMPHSABS 2.0 11/13/2022 1758   MONOABS 0.5 11/13/2022 1758   EOSABS 0.2 11/13/2022 1758   BASOSABS 0.0 11/13/2022 1758          UA  ordered   Urine analysis:    Component Value Date/Time   COLORURINE YELLOW 07/01/2015 [place] 07/01/2015 1034   LABSPEC 1.020 07/01/2015 1034   PHURINE 6.0 07/01/2015 1034   GLUCOSEU NEGATIVE 07/01/2015 1034   HGBUR LARGE (A) 07/01/2015 1034   BILIRUBINUR NEGATIVE 07/01/2015 1034   KETONESUR NEGATIVE 07/01/2015 1034   PROTEINUR NEGATIVE 07/01/2015 1034   UROBILINOGEN 0.2 07/01/2015 1034   NITRITE NEGATIVE 07/01/2015 1034   LEUKOCYTESUR NEGATIVE 07/01/2015 1034    Results for orders placed or performed during the hospital encounter of 11/13/22  Resp panel by RT-PCR (RSV, Flu A&B, Covid) Anterior Nasal Swab     Status: None   Collection Time: 11/13/22  6:28 PM   Specimen: Anterior Nasal Swab  Result Value Ref Range Status   SARS Coronavirus 2 by RT PCR NEGATIVE NEGATIVE Final         Influenza A by PCR NEGATIVE NEGATIVE Final   Influenza B by PCR NEGATIVE NEGATIVE Final         Resp Syncytial Virus by PCR NEGATIVE NEGATIVE Final          _______________________________________________ Hospitalist was called for admission for   Atrial flutter with rapid ventricular response ([place])     The following Work up has been ordered so far:  Orders Placed This Encounter  Procedures   Resp panel by RT-PCR (RSV, Flu A&B, Covid) Anterior Nasal Swab   DG Chest Portable 1 View   CT Angio Chest PE W and/or Wo Contrast   CBC with Differential   Basic metabolic panel   Magnesium   TSH   T4, free    hCG, quantitative, pregnancy   Brain natriuretic peptide   D-dimer, quantitative   Iron and TIBC   Ferritin (Iron Binding Protein)   Occult blood card to lab, stool   APTT   Protime-INR   CBC   Heparin level (unfractionated)   Cardiac monitoring   Inpatient consult to Cardiology   heparin per pharmacy consult   Consult to hospitalist   Airborne and Contact precautions   Pulse oximetry, continuous   ED EKG   EKG 12-Lead   EKG 12-Lead     OTHER Significant initial  Findings:  labs showing:    Recent Labs  Lab 11/13/22 1758  NA 138  K 4.1  CO2 25  GLUCOSE 192*  BUN 15  CREATININE 1.01*  CALCIUM 8.6*  MG 1.9    Cr  stable,    Lab Results  Component Value Date   CREATININE 1.01 (H) 11/13/2022   CREATININE 0.94 08/15/2022   CREATININE 0.82 01/23/2021    No results for input(s): "AST", "ALT", "ALKPHOS", "BILITOT", "PROT", "ALBUMIN" in the last 168 hours. Lab Results  Component Value Date   CALCIUM 8.6 (L) 11/13/2022        Plt: Lab Results  Component Value Date   PLT 335 11/13/2022      COVID-19 Labs  Recent Labs    11/13/22 1829 11/13/22 1845  DDIMER 1.21*  --   FERRITIN  --  14  Lab Results  Component Value Date   SARSCOV2NAA NEGATIVE 11/13/2022   [place] NEGATIVE 10/28/2019    Recent Labs  Lab 11/13/22 1758  WBC 11.1*  NEUTROABS 8.3*  HGB 8.8*  HCT 32.9*  MCV 75.5*  PLT 335    HG/HCT   Down  from baseline see below    Component Value Date/Time   HGB 8.8 (L) 11/13/2022 1758   HCT 32.9 (L) 11/13/2022 1758   MCV 75.5 (L) 11/13/2022 1758    Cardiac Panel (last 3 results) No results for input(s): "CKTOTAL", "CKMB", "TROPONINI", "RELINDX" in the last 72 hours.  .car BNP (last 3 results) Recent Labs    08/15/22 1245 11/13/22 1758  BNP 221.9* 228.6*      DM  labs:  HbA1C: No results for input(s): "HGBA1C" in the last 8760 hours.     CBG (last 3)  No results for input(s): "GLUCAP" in the last 72 hours.         Cultures: No results found for: "SDES", "SPECREQUEST", "CULT", "REPTSTATUS"   Radiological Exams on Admission: DG Chest Portable 1 View  Result Date: 11/13/2022 CLINICAL DATA:  Provided history: Shortness of breath. Chest pain. History of SVT. EXAM: PORTABLE CHEST 1 VIEW COMPARISON:  Prior chest radiographs 08/15/2022 and earlier. FINDINGS: Cardiac monitoring pads overlie the chest. Heart size within normal limits. No appreciable airspace consolidation or pulmonary edema. No evidence of pleural effusion or pneumothorax. No acute osseous abnormality identified. IMPRESSION: No evidence of active cardiopulmonary disease. Electronically Signed   By: Kellie Simmering D.O.   On: 11/13/2022 18:19   _______________________________________________________________________________________________________ Latest  Blood pressure (!) 127/108, pulse (!) 134, temperature 98.1 F (36.7 C), temperature source Oral, resp. rate (!) 34, height 5' 2"$  (1.575 m), weight (!) 140.6 kg, SpO2 99 %.   Vitals  labs and radiology finding personally reviewed  Review of Systems:    Pertinent positives include:  dyspnea on exertion, shortness of breath at rest. N  Constitutional:  No weight loss, night sweats, Fevers, chills, fatigue, weight loss  HEENT:  No headaches, Difficulty swallowing,Tooth/dental problems,Sore throat,  No sneezing, itching, ear ache, nasal congestion, post nasal drip,  Cardio-vascular:  No chest pain, Orthopnea, PND, anasarca, dizziness, palpitations.no Bilateral lower extremity swelling  GI:  No heartburn, indigestion, abdominal pain, nausea, vomiting, diarrhea, change in bowel habits, loss of appetite, melena, blood in stool, hematemesis Resp:  no o No excess mucus, no productive cough, No non-productive cough, No coughing up of blood.No change in color of mucus.No wheezing. Skin:  no rash or lesions. No jaundice GU:  no dysuria, change in color of urine, no urgency or frequency. No straining to  urinate.  No flank pain.  Musculoskeletal:  No joint pain or no joint swelling. No decreased range of motion. No back pain.  Psych:  No change in mood or affect. No depression or anxiety. No memory loss.  Neuro: no localizing neurological complaints, no tingling, no weakness, no double vision, no gait abnormality, no slurred speech, no confusion  All systems reviewed and apart from [place] all are negative _______________________________________________________________________________________________ Past Medical History:   Past Medical History:  Diagnosis Date   Anemia    Arthritis    Depression    Hypertension    Panic attack       Past Surgical History:  Procedure Laterality Date   NO PAST SURGERIES     ORIF ANKLE FRACTURE Right 10/29/2019   Procedure: OPEN REDUCTION INTERNAL FIXATION (ORIF) ANKLE FRACTURE;  Surgeon: Edmonia Lynch  D, MD;  Location: [facility];  Service: Orthopedics;  Laterality: Right;    Social History:  Ambulatory   independently       reports that she has quit smoking. She has never used smokeless tobacco. She reports that she does not currently use alcohol. She reports that she does not use drugs.   Family History:   Family History  Adopted: Yes   ______________________________________________________________________________________________ Allergies: Allergies  Allergen Reactions   Codeine Other (See Comments)    "Hallucinations. Narcotics cause vomiting."   Sulfa Antibiotics Other (See Comments)    "lifelong allergy"     Prior to Admission medications   Medication Sig Start Date End Date Taking? Authorizing Provider  DULoxetine (CYMBALTA) 60 MG capsule Take 60 mg by mouth daily. 10/22/20  Yes [provider]  omeprazole (PRILOSEC) 20 MG capsule Take 20 mg by mouth daily as needed (indigestion).   Yes [provider]  propranolol (INDERAL) 40 MG tablet Take 40 mg by mouth 2 (two) times daily. 06/20/19  Yes [provider]  albuterol (VENTOLIN HFA) 108 (90 Base) MCG/ACT inhaler Inhale 2 puffs into the lungs every 6 (six) hours as needed for wheezing or shortness of breath. Patient not taking: Reported on 11/13/2022 06/29/20   [provider]  aspirin EC 81 MG tablet Take 1 tablet (81 mg total) by mouth 2 (two) times daily. For DVT prophylaxis for 30 days after surgery. Patient not taking: Reported on 11/16/2020 10/29/19   Prudencio Burly III, PA-C  diltiazem (CARDIZEM CD) 180 MG 24 hr capsule Take 1 capsule (180 mg total) by mouth daily. Patient not taking: Reported on 11/13/2022 08/15/22   Tedd Sias, PA  docusate sodium (COLACE) 100 MG capsule Take 1 capsule (100 mg total) by mouth 2 (two) times daily. To prevent constipation while taking pain medication. Patient not taking: Reported on 11/16/2020 10/29/19   Prudencio Burly III, PA-C  methocarbamol (ROBAXIN) 500 MG tablet Take 1 tablet (500 mg total) by mouth every 8 (eight) hours as needed for muscle spasms. Patient not taking: Reported on 11/16/2020 10/29/19   Prudencio Burly III, PA-C  metoprolol tartrate (LOPRESSOR) 25 MG tablet Take 1 tablet (25 mg total) by mouth once as needed for up to 1 dose (FOR SEVERE PALPITATIONS WHEN SBP > 100 MMHG). Patient not taking: Reported on 11/13/2022 01/23/21   Varney Biles, MD  norgestimate-ethinyl estradiol (ORTHO-CYCLEN,SPRINTEC,PREVIFEM) 0.25-35 MG-MCG tablet Take 1 tablet by mouth daily. Patient not taking: Reported on 10/28/2019 07/01/15   Rasch, Anderson Malta I, NP  ondansetron (ZOFRAN ODT) 4 MG disintegrating tablet Take 1 tablet (4 mg total) by mouth every 8 (eight) hours as needed for nausea or vomiting. Patient not taking: Reported on 11/16/2020 10/28/19   Tacy Learn, PA-C    ___________________________________________________________________________________________________ Physical Exam:    11/13/2022   10:24 PM 11/13/2022   10:15 PM 11/13/2022    9:30 PM  Vitals with BMI  Systolic   [id number]  Diastolic  [id number]  Pulse [id number] 124 130     1. General:  in No  Acute distress    Chronically ill  -appearing 2. Psychological: Alert and   Oriented 3. Head/ENT:   Moist  Mucous Membranes                          Head Non traumatic, neck supple  Poor Dentition 4. SKIN:  Skin turgor,  Skin clean Dry and intact no rash 5. Heart: Regular rate and rhythm no  Murmur, no Rub or gallop 6. Lungs:  , no wheezes or crackles distant 7. Abdomen: Soft,  non-tender,   distended   obese  bowel sounds present 8. Lower extremities: no clubbing, cyanosis, no  edema 9. Neurologically Grossly intact, moving all 4 extremities equally   10. MSK: Normal range of motion    Chart has been reviewed  ______________________________________________________________________________________________  Assessment/Plan 52 y.o. female with medical history significant of a flutter not on anticoagulation  Admitted for   Atrial flutter with rapid ventricular response ([place])     Present on Admission:  Atrial flutter (HCC)  Obesity, Class III, BMI 40-49.9 (morbid obesity) (HCC)  Anemia  Elevated TSH  Constipation  Essential hypertension   Atrial flutter ([place])  - Admit to step down on Cardizem drip       CHA2D-VASC score 3       Will start on  Heparin,                  Check TSH      Cycle cardiac enzymes      Obtain ECHO      Cardiology consult in AM   DM (diabetes mellitus), type 2 (HCC)  - Order Sensitive SSI     -  check TSH and HgA1C  - Hold by mouth medications  Diabetes coordinator   Obesity, Class III, BMI 40-49.9 (morbid obesity) ([place]) Will need referral to nutritional center as an outpatient  Anemia Obtain anemia panel Hemoccult stool will likely need further workup patient has had progressive downtrending anemia for the past few months.  Transfuse if hemoglobin reaches below 7 if evidence of iron deficiency anemia and Hemoccult positive would benefit from  GI workup as patient will likely need long-term anticoagulation and would need clearance Sent message to [facility] GI Protonix 40 twice daily   Elevated TSH Check T3-T4 levels  Constipation Obtain KUB  Essential hypertension Resume propranolol at 40 mg p.o. twice daily this is in the past well-tolerated waited together Cardizem    Other plan as per orders.  DVT prophylaxis:  heparin    Code Status:    Code Status: Prior FULL CODE   as per patient   I had personally discussed CODE STATUS with patient     Family Communication:   Family not at  Bedside    Disposition Plan:       To home once workup is complete and patient is stable   Following barriers for discharge:                         HR <100                               Anemia  stable or corrected                             Pain controlled with PO medications  Will need consultants to evaluate patient prior to discharge                     Diabetes care coordinator                   Transition of care consulted                                      Consults called:  emailed  Cardiology to see in AM  Sent message to [facility] GI Admission status:  ED Disposition     ED Disposition  [place]: [place] [[id number]]  Level of Care: Progressive [102]  Admit to Progressive based on following criteria: CARDIOVASCULAR & THORACIC of moderate stability with acute coronary syndrome symptoms/low risk myocardial infarction/hypertensive urgency/arrhythmias/heart failure potentially compromising stability and stable post cardiovascular intervention patients.  May place patient in observation at [facility] or [place] if equivalent level of care is available:: No  Covid Evaluation: Confirmed COVID Negative  Diagnosis: Atrial flutter ([place]) [427.32.ICD-9-CM]  Admitting Physician: Debbie Lee [[id number]]  Attending Physician: Debbie Lee [[id number]]           Obs     Level of care        progressive tele indefinitely please discontinue once patient no longer qualifies COVID-19 Labs    Lab Results  Component Value Date   [place] 11/13/2022     Precautions: admitted as   Covid Negative    Debbie Lee 11/13/2022, 11:40 PM    [facility]     after 2 AM please page floor coverage PA If 7AM-7PM, please contact the day team taking care of the patient using [email or web address]   Patient was evaluated in the context of the global COVID-19 pandemic, which necessitated consideration that the patient might be at risk for infection with the SARS-CoV-2 virus that causes COVID-19. Institutional protocols and algorithms that pertain to the evaluation of patients at risk for COVID-19 are in a state of rapid change based on information released by regulatory bodies including the CDC and federal and state organizations. These policies and algorithms were followed during the patient's care.

## 2022-11-13 NOTE — Assessment & Plan Note (Addendum)
Obtain anemia panel Hemoccult stool will likely need further workup patient has had progressive downtrending anemia for the past few months.  Transfuse if hemoglobin reaches below 7 if evidence of iron deficiency anemia and Hemoccult positive would benefit from GI workup as patient will likely need long-term anticoagulation and would need clearance Sent message to [facility] GI Protonix 40 twice daily

## 2022-11-13 NOTE — ED Provider Notes (Signed)
[place] EMERGENCY DEPARTMENT AT [facility] Provider Note   CSN: [id number] Arrival date & time: 11/13/22  1710     History  Chief Complaint  Patient presents with   Shortness of Breath    Debbie Lee is a 52 y.o. female.   Shortness of Breath Associated symptoms: cough      52 year old female with medical history significant for atrial flutter, depression, anemia, panic attacks, hypertension who presents to the emergency department with a sensation of shortness of breath for the past 2 days.  She endorses worsening symptoms today.  She denies any chest pain.  On chart review, the patient was seen in the emergency department in November 2023 and diagnosed with atrial flutter with RVR.  She was administered a diltiazem infusion after a successful load and subsequently converted to oral diltiazem.  She was a CHA2DS2-VASc of 2 at the time and was not discharged on anticoagulation.  Of note, the patient has had a recent hemoglobin A1c which was positive at over 8 making her a CHA2DS2-VASc now of 3.  She states that she was unable to follow-up with cardiology in January due to the high co-pay and ran out of her home Cardizem.  She denies any lower extremity swelling.  She denies any history of DVT or PE.  She denies any fever, chills.  She does states that she has had a dry cough for the past few days.  Home Medications Prior to Admission medications   Medication Sig Start Date End Date Taking? Authorizing Provider  DULoxetine (CYMBALTA) 60 MG capsule Take 60 mg by mouth daily. 10/22/20  Yes [provider]  omeprazole (PRILOSEC) 20 MG capsule Take 20 mg by mouth daily as needed (indigestion).   Yes [provider]  propranolol (INDERAL) 40 MG tablet Take 40 mg by mouth 2 (two) times daily. 06/20/19  Yes [provider]  albuterol (VENTOLIN HFA) 108 (90 Base) MCG/ACT inhaler Inhale 2 puffs into the lungs every 6 (six) hours as needed for wheezing or  shortness of breath. Patient not taking: Reported on 11/13/2022 06/29/20   [provider]  aspirin EC 81 MG tablet Take 1 tablet (81 mg total) by mouth 2 (two) times daily. For DVT prophylaxis for 30 days after surgery. Patient not taking: Reported on 11/16/2020 10/29/19   Prudencio Burly III, PA-C  diltiazem (CARDIZEM CD) 180 MG 24 hr capsule Take 1 capsule (180 mg total) by mouth daily. Patient not taking: Reported on 11/13/2022 08/15/22   Tedd Sias, PA  docusate sodium (COLACE) 100 MG capsule Take 1 capsule (100 mg total) by mouth 2 (two) times daily. To prevent constipation while taking pain medication. Patient not taking: Reported on 11/16/2020 10/29/19   Prudencio Burly III, PA-C  methocarbamol (ROBAXIN) 500 MG tablet Take 1 tablet (500 mg total) by mouth every 8 (eight) hours as needed for muscle spasms. Patient not taking: Reported on 11/16/2020 10/29/19   Prudencio Burly III, PA-C  metoprolol tartrate (LOPRESSOR) 25 MG tablet Take 1 tablet (25 mg total) by mouth once as needed for up to 1 dose (FOR SEVERE PALPITATIONS WHEN SBP > 100 MMHG). Patient not taking: Reported on 11/13/2022 01/23/21   Varney Biles, MD  norgestimate-ethinyl estradiol (ORTHO-CYCLEN,SPRINTEC,PREVIFEM) 0.25-35 MG-MCG tablet Take 1 tablet by mouth daily. Patient not taking: Reported on 10/28/2019 07/01/15   Rasch, Anderson Malta I, NP  ondansetron (ZOFRAN ODT) 4 MG disintegrating tablet Take 1 tablet (4 mg total) by mouth every  8 (eight) hours as needed for nausea or vomiting. Patient not taking: Reported on 11/16/2020 10/28/19   Suella Broad A, PA-C      Allergies    Codeine and Sulfa antibiotics    Review of Systems   Review of Systems  Respiratory:  Positive for cough and shortness of breath.   All other systems reviewed and are negative.   Physical Exam Updated Vital Signs BP (!) 127/108   Pulse (!) 134   Temp 98.1 F (36.7 C) (Oral)   Resp (!) 34   Ht 5' 2"$  (1.575 m)   Wt (!)  140.6 kg   SpO2 99%   BMI 56.70 kg/m  Physical Exam Vitals and nursing note reviewed.  Constitutional:      General: She is not in acute distress.    Appearance: She is well-developed. She is obese.  HENT:     Head: Normocephalic and atraumatic.  Eyes:     Conjunctiva/sclera: Conjunctivae normal.  Cardiovascular:     Rate and Rhythm: Regular rhythm. Tachycardia present.  Pulmonary:     Effort: Pulmonary effort is normal. No respiratory distress.     Breath sounds: Normal breath sounds.  Abdominal:     Palpations: Abdomen is soft.     Tenderness: There is no abdominal tenderness.  Musculoskeletal:        General: No swelling.     Cervical back: Neck supple.  Skin:    General: Skin is warm and dry.     Capillary Refill: Capillary refill takes less than 2 seconds.  Neurological:     Mental Status: She is alert.  Psychiatric:        Mood and Affect: Mood normal.     ED Results / Procedures / Treatments   Labs (all labs ordered are listed, but only abnormal results are displayed) Labs Reviewed  CBC WITH DIFFERENTIAL/PLATELET - Abnormal; Notable for the following components:      Result Value   WBC 11.1 (*)    Hemoglobin 8.8 (*)    HCT 32.9 (*)    MCV 75.5 (*)    MCH 20.2 (*)    MCHC 26.7 (*)    RDW 19.3 (*)    Neutro Abs 8.3 (*)    All other components within normal limits  BASIC METABOLIC PANEL - Abnormal; Notable for the following components:   Glucose, Bld 192 (*)    Creatinine, Ser 1.01 (*)    Calcium 8.6 (*)    All other components within normal limits  TSH - Abnormal; Notable for the following components:   TSH 6.845 (*)    All other components within normal limits  BRAIN NATRIURETIC PEPTIDE - Abnormal; Notable for the following components:   B Natriuretic Peptide 228.6 (*)    All other components within normal limits  D-DIMER, QUANTITATIVE - Abnormal; Notable for the following components:   D-Dimer, Quant 1.21 (*)    All other components within normal  limits  IRON AND TIBC - Abnormal; Notable for the following components:   Saturation Ratios 7 (*)    All other components within normal limits  PROTIME-INR - Abnormal; Notable for the following components:   Prothrombin Time 17.5 (*)    INR 1.4 (*)    All other components within normal limits  RESP PANEL BY RT-PCR (RSV, FLU A&B, COVID)  RVPGX2  MAGNESIUM  HCG, QUANTITATIVE, PREGNANCY  FERRITIN  APTT  T4, FREE  OCCULT BLOOD X 1 CARD TO LAB, STOOL  CBC  HEPARIN LEVEL (UNFRACTIONATED)  TROPONIN I (HIGH SENSITIVITY)  TROPONIN I (HIGH SENSITIVITY)    EKG EKG Interpretation  Date/Time:  Sunday November 13 2022 18:14:17 EST Ventricular Rate:  153 PR Interval:  124 QRS Duration: 80 QT Interval:  342 QTC Calculation: 546 R Axis:   87 Text Interpretation: Atrial flutter 2:1 conduction, with rapid ventricular response Prolonged QT interval Confirmed by Aerik Polan ([id number]) on 11/13/2022 10:40:47 PM  Radiology DG Chest Portable 1 View  Result Date: 11/13/2022 CLINICAL DATA:  Provided history: Shortness of breath. Chest pain. History of SVT. EXAM: PORTABLE CHEST 1 VIEW COMPARISON:  Prior chest radiographs 08/15/2022 and earlier. FINDINGS: Cardiac monitoring pads overlie the chest. Heart size within normal limits. No appreciable airspace consolidation or pulmonary edema. No evidence of pleural effusion or pneumothorax. No acute osseous abnormality identified. IMPRESSION: No evidence of active cardiopulmonary disease. Electronically Signed   By: Kyle  Golden D.O.   On: 11/13/2022 18:19    Procedures Procedures    Medications Ordered in ED Medications  adenosine (ADENOCARD) 6 MG/2ML injection (  Not Given 11/13/22 1820)  diltiazem (CARDIZEM) 125 mg in dextrose 5% 125 mL (1 mg/mL) infusion (15 mg/hr Intravenous Rate/Dose Change 11/13/22 2136)  heparin bolus via infusion 4,000 Units (4,000 Units Intravenous Bolus from Bag 11/13/22 2142)    Followed by  heparin ADULT infusion 100 units/mL  (25000 units/250mL) (1,300 Units/hr Intravenous New Bag/Given 11/13/22 2140)  iohexol (OMNIPAQUE) 350 MG/ML injection 100 mL (has no administration in time range)  diltiazem (CARDIZEM) injection 10 mg (10 mg Intravenous Given 11/13/22 1829)  diltiazem (CARDIZEM CD) 24 hr capsule 180 mg (180 mg Oral Given 11/13/22 1925)    ED Course/ Medical Decision Making/ A&P         CHA2DS2-VASc Score: 3                    Medical Decision Making Amount and/or Complexity of Data Reviewed Labs: ordered. Radiology: ordered.  Risk Prescription drug management. Decision regarding hospitalization.     51$  year old female with medical history significant for atrial flutter, depression, anemia, panic attacks, hypertension who presents to the emergency department with a sensation of shortness of breath for the past 2 days.  She endorses worsening symptoms today.  She denies any chest pain.  On chart review, the patient was seen in the emergency department in November 2023 and diagnosed with atrial flutter with RVR.  She was administered a diltiazem infusion after a successful load and subsequently converted to oral diltiazem.  She was a CHA2DS2-VASc of 2 at the time and was not discharged on anticoagulation.  Of note, the patient has had a recent hemoglobin A1c which was positive at over 8 making her a CHA2DS2-VASc now of 3.  She states that she was unable to follow-up with cardiology in January due to the high co-pay and ran out of her home Cardizem.  She denies any lower extremity swelling.  She denies any history of DVT or PE.  She denies any fever, chills.  She does states that she has had a dry cough for the past few days.  On arrival, the patient was afebrile, temperature 98, tachycardic heart rate 156, hemodynamically stable BP 157/138, saturating 99% on room air.  Patient was not tachypneic RR 16.  On cardiac telemetry, the patient appeared to have either SVT or atrial flutter.  Initial EKG did not clearly  show flutter.  Vagal maneuvers were attempted which ultimately were unsuccessful.  Repeat EKG following attempted  vagal maneuvers showed likely 2-1 atrial flutter with RVR.  IV access was obtained and the patient was given IV diltiazem.  Discussed with on-call cardiology fellow who recommended attempting to transition the patient to oral diltiazem, discharged on outpatient anticoagulation given her CHA2DS2-VASc score.  The patient was administered 180 mg of oral diltiazem but subsequently went back into RVR with rates in the 150s.  She was then started on a diltiazem gtt. and was started on heparin.  Laboratory evaluation significant for BNP mildly elevated at 228, TSH elevated at 6.8, BMP with hyperglycemia 192, otherwise generally unremarkable, CBC with a mild anemia to 8.8 that is microcytic, iron panel and ferritin normal, COVID-19, influenza, RSV PCR testing negative, magnesium normal, free T4 pending, troponin negative, D-dimer elevated at 1.21.    Given the patient's uncontrolled atrial flutter and elevated D-dimer with associated shortness of breath, CTA PE imaging was obtained and results indicate the following: ***    Hospitalist medicine was consulted for admission. Plan for cardiology consult inpatient.  {Document critical care time when appropriate:1} {Document review of labs and clinical decision tools ie heart score, Chads2Vasc2 etc:1}  {Document your independent review of radiology images, and any outside records:1} {Document your discussion with family members, caretakers, and with consultants:1} {Document social determinants of health affecting pt's care:1} {Document your decision making why or why not admission, treatments were needed:1} Final Clinical Impression(s) / ED Diagnoses Final diagnoses:  Atrial flutter with rapid ventricular response ([place])    Rx / DC Orders ED Discharge Orders     None

## 2022-11-13 NOTE — Assessment & Plan Note (Signed)
Resume propranolol at 40 mg p.o. twice daily this is in the past well-tolerated waited together Cardizem

## 2022-11-13 NOTE — Assessment & Plan Note (Signed)
-   Order Sensitive SSI     -  check TSH and HgA1C  - Hold by mouth medications  Diabetes coordinator

## 2022-11-13 NOTE — ED Triage Notes (Addendum)
Patient report SOB x 2 days. Pt report worsening SOB today. Pt denies Chest pain. Patient report Hx SVT.

## 2022-11-13 NOTE — Assessment & Plan Note (Signed)
Obtain KUB

## 2022-11-13 NOTE — ED Notes (Signed)
Pt transported to CT with this RN 

## 2022-11-13 NOTE — Progress Notes (Signed)
ANTICOAGULATION CONSULT NOTE - Initial Consult  Pharmacy Consult for IV heparin Indication: atrial flutter  Allergies  Allergen Reactions   Codeine Other (See Comments)    "Hallucinations. Narcotics cause vomiting."   Sulfa Antibiotics Other (See Comments)    "lifelong allergy"    Patient Measurements: Height: 5' 2"$  (157.5 cm) Weight: (!) 140.6 kg (310 lb) IBW/kg (Calculated) : 50.1 Heparin Dosing Weight: 86 kg  Vital Signs: Temp: 98.1 F (36.7 C) (02/18 1926) Temp Source: Oral (02/18 1926) BP: 151/97 (02/18 2042) Pulse Rate: 135 (02/18 2042)  Labs: Recent Labs    11/13/22 1758  HGB 8.8*  HCT 32.9*  PLT 335  CREATININE 1.01*  TROPONINIHS 4    Estimated Creatinine Clearance: 91.8 mL/min (A) (by C-G formula based on SCr of 1.01 mg/dL (H)).   Medical History: Past Medical History:  Diagnosis Date   Anemia    Arthritis    Depression    Hypertension    Panic attack     Assessment: 33 y/oF presents to [facility] ED on 11/13/22 with worsening SOB. Pharmacy consulted for IV heparin dosing for atrial flutter. D-dimer elevated at 1.21. CTa chest also ordered to evaluate for PE. Patient not on anticoagulation PTA. Baseline CBC: Hgb low at 8.8, Plt WNL.   Goal of Therapy:  Heparin level 0.3-0.7 units/ml Monitor platelets by anticoagulation protocol: Yes   Plan:  Baseline aPTT, PT/INR ordered Heparin 4000 units IV bolus x 1, then start heparin infusion at 1300 units/hr Heparin level 6 hours after initiation  Daily CBC, heparin level Monitor closely for s/sx of bleeding   Lindell Spar, PharmD, BCPS Clinical Pharmacist 11/13/2022,8:46 PM

## 2022-11-13 NOTE — ED Notes (Signed)
Pt has CT PE study ordered and rad tech at bedside for transport but pt needs to urinate. Pt's HR is sustained 155 at this time. Dr Armandina Gemma at bedside, as well, and decision made to initiate IV cardizem as p.o. medication was ineffective. Pt placed on external female catheter as she is considered unstable to use bedside toilet at this time. Will delay CT  for short time until medications and necessary labs can be administered/obtained. Pt denies clinical symptoms at this time

## 2022-11-13 NOTE — Assessment & Plan Note (Signed)
Check T3-T4 levels

## 2022-11-13 NOTE — Subjective & Objective (Signed)
Presented with SOB for the past 2 days but has gotten worse today no chest pain she has known history of of SVT and a flutter not on anticoagulation because initially her CHA2DS2-VASc score was only 2 since then she has been diagnosed with diabetes increasing her [place] Vascor up to 3 but she has not followed up with cardiology and has ran out of Cardizem. No prior history of DVT or PE

## 2022-11-13 NOTE — Assessment & Plan Note (Signed)
Will need referral to nutritional center as an outpatient

## 2022-11-13 NOTE — Assessment & Plan Note (Signed)
-   Admit to step down on Cardizem drip       CHA2D-VASC score 3       Will start on  Heparin,                  Check TSH      Cycle cardiac enzymes      Obtain ECHO      Cardiology consult in AM

## 2022-11-14 ENCOUNTER — Encounter (HOSPITAL_COMMUNITY): Payer: Self-pay | Admitting: Internal Medicine

## 2022-11-14 ENCOUNTER — Observation Stay (HOSPITAL_COMMUNITY): Payer: BLUE CROSS/BLUE SHIELD

## 2022-11-14 DIAGNOSIS — G4733 Obstructive sleep apnea (adult) (pediatric): Secondary | ICD-10-CM | POA: Diagnosis present

## 2022-11-14 DIAGNOSIS — D509 Iron deficiency anemia, unspecified: Secondary | ICD-10-CM | POA: Diagnosis present

## 2022-11-14 DIAGNOSIS — I10 Essential (primary) hypertension: Secondary | ICD-10-CM | POA: Diagnosis not present

## 2022-11-14 DIAGNOSIS — I5021 Acute systolic (congestive) heart failure: Secondary | ICD-10-CM | POA: Diagnosis present

## 2022-11-14 DIAGNOSIS — Q2112 Patent foramen ovale: Secondary | ICD-10-CM | POA: Diagnosis not present

## 2022-11-14 DIAGNOSIS — E119 Type 2 diabetes mellitus without complications: Secondary | ICD-10-CM | POA: Diagnosis not present

## 2022-11-14 DIAGNOSIS — F32A Depression, unspecified: Secondary | ICD-10-CM | POA: Diagnosis present

## 2022-11-14 DIAGNOSIS — K625 Hemorrhage of anus and rectum: Secondary | ICD-10-CM | POA: Diagnosis present

## 2022-11-14 DIAGNOSIS — Z885 Allergy status to narcotic agent status: Secondary | ICD-10-CM | POA: Diagnosis not present

## 2022-11-14 DIAGNOSIS — R946 Abnormal results of thyroid function studies: Secondary | ICD-10-CM | POA: Diagnosis present

## 2022-11-14 DIAGNOSIS — Z1152 Encounter for screening for COVID-19: Secondary | ICD-10-CM | POA: Diagnosis not present

## 2022-11-14 DIAGNOSIS — R7989 Other specified abnormal findings of blood chemistry: Secondary | ICD-10-CM | POA: Diagnosis not present

## 2022-11-14 DIAGNOSIS — K760 Fatty (change of) liver, not elsewhere classified: Secondary | ICD-10-CM | POA: Diagnosis present

## 2022-11-14 DIAGNOSIS — D649 Anemia, unspecified: Secondary | ICD-10-CM | POA: Diagnosis not present

## 2022-11-14 DIAGNOSIS — I110 Hypertensive heart disease with heart failure: Secondary | ICD-10-CM | POA: Diagnosis present

## 2022-11-14 DIAGNOSIS — I420 Dilated cardiomyopathy: Secondary | ICD-10-CM | POA: Diagnosis present

## 2022-11-14 DIAGNOSIS — Z87891 Personal history of nicotine dependence: Secondary | ICD-10-CM | POA: Diagnosis not present

## 2022-11-14 DIAGNOSIS — I483 Typical atrial flutter: Secondary | ICD-10-CM | POA: Diagnosis not present

## 2022-11-14 DIAGNOSIS — Z79899 Other long term (current) drug therapy: Secondary | ICD-10-CM | POA: Diagnosis not present

## 2022-11-14 DIAGNOSIS — D6859 Other primary thrombophilia: Secondary | ICD-10-CM | POA: Diagnosis present

## 2022-11-14 DIAGNOSIS — E1165 Type 2 diabetes mellitus with hyperglycemia: Secondary | ICD-10-CM | POA: Diagnosis present

## 2022-11-14 DIAGNOSIS — Z882 Allergy status to sulfonamides status: Secondary | ICD-10-CM | POA: Diagnosis not present

## 2022-11-14 DIAGNOSIS — I4891 Unspecified atrial fibrillation: Secondary | ICD-10-CM | POA: Diagnosis present

## 2022-11-14 DIAGNOSIS — I4892 Unspecified atrial flutter: Secondary | ICD-10-CM | POA: Diagnosis present

## 2022-11-14 DIAGNOSIS — J45909 Unspecified asthma, uncomplicated: Secondary | ICD-10-CM | POA: Diagnosis present

## 2022-11-14 DIAGNOSIS — K5900 Constipation, unspecified: Secondary | ICD-10-CM | POA: Diagnosis present

## 2022-11-14 DIAGNOSIS — Z6841 Body Mass Index (BMI) 40.0 and over, adult: Secondary | ICD-10-CM | POA: Diagnosis not present

## 2022-11-14 DIAGNOSIS — Z23 Encounter for immunization: Secondary | ICD-10-CM | POA: Diagnosis not present

## 2022-11-14 LAB — URINALYSIS, COMPLETE (UACMP) WITH MICROSCOPIC
Bilirubin Urine: NEGATIVE
Glucose, UA: NEGATIVE mg/dL
Ketones, ur: NEGATIVE mg/dL
Leukocytes,Ua: NEGATIVE
Nitrite: NEGATIVE
Protein, ur: NEGATIVE mg/dL
Specific Gravity, Urine: >1.046 — ABNORMAL HIGH (ref 1.005–1.030)
pH: 5 (ref 5.0–8.0)

## 2022-11-14 LAB — CBC
HCT: 32.8 % — ABNORMAL LOW (ref 36.0–46.0)
HCT: 30.8 % — ABNORMAL LOW (ref 36.0–46.0)
HCT: 29.3 % — ABNORMAL LOW (ref 36.0–46.0)
HCT: 29.4 % — ABNORMAL LOW (ref 36.0–46.0)
Hemoglobin: 8.7 g/dL — ABNORMAL LOW (ref 12.0–15.0)
Hemoglobin: 8.3 g/dL — ABNORMAL LOW (ref 12.0–15.0)
Hemoglobin: 8 g/dL — ABNORMAL LOW (ref 12.0–15.0)
Hemoglobin: 8 g/dL — ABNORMAL LOW (ref 12.0–15.0)
MCH: 20.1 pg — ABNORMAL LOW (ref 26.0–34.0)
MCH: 20.3 pg — ABNORMAL LOW (ref 26.0–34.0)
MCH: 20.5 pg — ABNORMAL LOW (ref 26.0–34.0)
MCH: 20.5 pg — ABNORMAL LOW (ref 26.0–34.0)
MCHC: 26.5 g/dL — ABNORMAL LOW (ref 30.0–36.0)
MCHC: 26.9 g/dL — ABNORMAL LOW (ref 30.0–36.0)
MCHC: 27.3 g/dL — ABNORMAL LOW (ref 30.0–36.0)
MCHC: 27.2 g/dL — ABNORMAL LOW (ref 30.0–36.0)
MCV: 75.8 fL — ABNORMAL LOW (ref 80.0–100.0)
MCV: 75.5 fL — ABNORMAL LOW (ref 80.0–100.0)
MCV: 75.1 fL — ABNORMAL LOW (ref 80.0–100.0)
MCV: 75.4 fL — ABNORMAL LOW (ref 80.0–100.0)
Platelets: 327 10*3/uL (ref 150–400)
Platelets: 324 10*3/uL (ref 150–400)
Platelets: 295 10*3/uL (ref 150–400)
Platelets: 286 10*3/uL (ref 150–400)
RBC: 4.33 MIL/uL (ref 3.87–5.11)
RBC: 4.08 MIL/uL (ref 3.87–5.11)
RBC: 3.9 MIL/uL (ref 3.87–5.11)
RBC: 3.9 MIL/uL (ref 3.87–5.11)
RDW: 19.2 % — ABNORMAL HIGH (ref 11.5–15.5)
RDW: 19.1 % — ABNORMAL HIGH (ref 11.5–15.5)
RDW: 19.2 % — ABNORMAL HIGH (ref 11.5–15.5)
RDW: 19.3 % — ABNORMAL HIGH (ref 11.5–15.5)
WBC: 14.1 10*3/uL — ABNORMAL HIGH (ref 4.0–10.5)
WBC: 13 10*3/uL — ABNORMAL HIGH (ref 4.0–10.5)
WBC: 12.8 10*3/uL — ABNORMAL HIGH (ref 4.0–10.5)
WBC: 11.2 10*3/uL — ABNORMAL HIGH (ref 4.0–10.5)
nRBC: 0 % (ref 0.0–0.2)
nRBC: 0.2 % (ref 0.0–0.2)
nRBC: 0 % (ref 0.0–0.2)
nRBC: 0.4 % — ABNORMAL HIGH (ref 0.0–0.2)

## 2022-11-14 LAB — COMPREHENSIVE METABOLIC PANEL
ALT: 29 U/L (ref 0–44)
AST: 33 U/L (ref 15–41)
Albumin: 3.1 g/dL — ABNORMAL LOW (ref 3.5–5.0)
Alkaline Phosphatase: 71 U/L (ref 38–126)
Anion gap: 9 (ref 5–15)
BUN: 14 mg/dL (ref 6–20)
CO2: 25 mmol/L (ref 22–32)
Calcium: 8 mg/dL — ABNORMAL LOW (ref 8.9–10.3)
Chloride: 100 mmol/L (ref 98–111)
Creatinine, Ser: 0.81 mg/dL (ref 0.44–1.00)
GFR, Estimated: >60 mL/min (ref >60)
Glucose, Bld: 176 mg/dL — ABNORMAL HIGH (ref 70–99)
Potassium: 3.8 mmol/L (ref 3.5–5.1)
Sodium: 134 mmol/L — ABNORMAL LOW (ref 135–145)
Total Bilirubin: 1.4 mg/dL — ABNORMAL HIGH (ref 0.3–1.2)
Total Protein: 6.6 g/dL (ref 6.5–8.1)

## 2022-11-14 LAB — GLUCOSE, CAPILLARY
Glucose-Capillary: 190 mg/dL — ABNORMAL HIGH (ref 70–99)
Glucose-Capillary: 165 mg/dL — ABNORMAL HIGH (ref 70–99)
Glucose-Capillary: 164 mg/dL — ABNORMAL HIGH (ref 70–99)
Glucose-Capillary: 211 mg/dL — ABNORMAL HIGH (ref 70–99)
Glucose-Capillary: 178 mg/dL — ABNORMAL HIGH (ref 70–99)
Glucose-Capillary: 213 mg/dL — ABNORMAL HIGH (ref 70–99)

## 2022-11-14 LAB — RETICULOCYTES
Immature Retic Fract: 39.1 % — ABNORMAL HIGH (ref 2.3–15.9)
RBC.: 4.03 MIL/uL (ref 3.87–5.11)
Retic Count, Absolute: 113.2 10*3/uL (ref 19.0–186.0)
Retic Ct Pct: 2.8 % (ref 0.4–3.1)

## 2022-11-14 LAB — ECHOCARDIOGRAM COMPLETE
AR max vel: 2.21 cm2
AV Area VTI: 2.38 cm2
AV Area mean vel: 2.16 cm2
AV Mean grad: 6 mmHg
AV Peak grad: 9.5 mmHg
Ao pk vel: 1.54 m/s
Area-P 1/2: 5.16 cm2
Calc EF: 51.8 %
Height: 62 in
S' Lateral: 3 cm
Single Plane A2C EF: 52.1 %
Single Plane A4C EF: 51.1 %
Weight: 4960 oz

## 2022-11-14 LAB — HEPATIC FUNCTION PANEL
ALT: 29 U/L (ref 0–44)
AST: 28 U/L (ref 15–41)
Albumin: 3 g/dL — ABNORMAL LOW (ref 3.5–5.0)
Alkaline Phosphatase: 69 U/L (ref 38–126)
Bilirubin, Direct: 0.2 mg/dL (ref 0.0–0.2)
Indirect Bilirubin: 1.1 mg/dL — ABNORMAL HIGH (ref 0.3–0.9)
Total Bilirubin: 1.3 mg/dL — ABNORMAL HIGH (ref 0.3–1.2)
Total Protein: 6.7 g/dL (ref 6.5–8.1)

## 2022-11-14 LAB — CBG MONITORING, ED: Glucose-Capillary: 168 mg/dL — ABNORMAL HIGH (ref 70–99)

## 2022-11-14 LAB — VITAMIN B12: Vitamin B-12: 244 pg/mL (ref 180–914)

## 2022-11-14 LAB — ABO/RH: ABO/RH(D): O POS

## 2022-11-14 LAB — HEPARIN LEVEL (UNFRACTIONATED)
Heparin Unfractionated: <0.1 IU/mL — ABNORMAL LOW (ref 0.30–0.70)
Heparin Unfractionated: <0.1 IU/mL — ABNORMAL LOW (ref 0.30–0.70)
Heparin Unfractionated: <0.1 IU/mL — ABNORMAL LOW (ref 0.30–0.70)
Heparin Unfractionated: <0.1 IU/mL — ABNORMAL LOW (ref 0.30–0.70)

## 2022-11-14 LAB — PHOSPHORUS
Phosphorus: 3.4 mg/dL (ref 2.5–4.6)
Phosphorus: 3.4 mg/dL (ref 2.5–4.6)

## 2022-11-14 LAB — MAGNESIUM: Magnesium: 1.9 mg/dL (ref 1.7–2.4)

## 2022-11-14 LAB — TYPE AND SCREEN
ABO/RH(D): O POS
Antibody Screen: NEGATIVE

## 2022-11-14 LAB — PREALBUMIN: Prealbumin: 12 mg/dL — ABNORMAL LOW (ref 18–38)

## 2022-11-14 LAB — CK: Total CK: 42 U/L (ref 38–234)

## 2022-11-14 LAB — OCCULT BLOOD X 1 CARD TO LAB, STOOL
Fecal Occult Bld: NEGATIVE
Fecal Occult Bld: NEGATIVE

## 2022-11-14 LAB — HEMOGLOBIN A1C
Hgb A1c MFr Bld: 9.3 % — ABNORMAL HIGH (ref 4.8–5.6)
Mean Plasma Glucose: 220.21 mg/dL

## 2022-11-14 LAB — FOLATE: Folate: 8.4 ng/mL (ref >5.9)

## 2022-11-14 LAB — HIV ANTIBODY (ROUTINE TESTING W REFLEX): HIV Screen 4th Generation wRfx: NONREACTIVE

## 2022-11-14 LAB — T4, FREE: Free T4: 0.89 ng/dL (ref 0.61–1.12)

## 2022-11-14 MED ORDER — FUROSEMIDE 10 MG/ML IJ SOLN
40.0000 mg | Freq: Once | INTRAMUSCULAR | Status: AC
  Administered 2022-11-14: 40 mg via INTRAVENOUS
  Filled 2022-11-14: qty 4

## 2022-11-14 MED ORDER — ACETAMINOPHEN 325 MG PO TABS
650.0000 mg | ORAL_TABLET | Freq: Four times a day (QID) | ORAL | Status: DC | PRN
  Administered 2022-11-19 – 2022-11-20 (×2): 650 mg via ORAL
  Filled 2022-11-14 (×2): qty 2

## 2022-11-14 MED ORDER — DULOXETINE HCL 60 MG PO CPEP
60.0000 mg | ORAL_CAPSULE | Freq: Every day | ORAL | Status: DC
  Administered 2022-11-14 – 2022-11-20 (×6): 60 mg via ORAL
  Filled 2022-11-14 (×7): qty 1

## 2022-11-14 MED ORDER — INSULIN STARTER KIT- PEN NEEDLES (ENGLISH)
1.0000 | Freq: Once | Status: AC
  Administered 2022-11-14: 1
  Filled 2022-11-14: qty 1

## 2022-11-14 MED ORDER — HYDROCODONE-ACETAMINOPHEN 5-325 MG PO TABS: 1.0000 | ORAL_TABLET | ORAL | Status: DC | PRN

## 2022-11-14 MED ORDER — LIVING WELL WITH DIABETES BOOK
Freq: Once | Status: AC
  Filled 2022-11-14: qty 1

## 2022-11-14 MED ORDER — ACETAMINOPHEN 650 MG RE SUPP: 650.0000 mg | Freq: Four times a day (QID) | RECTAL | Status: DC | PRN

## 2022-11-14 MED ORDER — PANTOPRAZOLE SODIUM 40 MG IV SOLR
40.0000 mg | Freq: Two times a day (BID) | INTRAVENOUS | Status: DC
  Administered 2022-11-14 – 2022-11-16 (×6): 40 mg via INTRAVENOUS
  Filled 2022-11-14 (×6): qty 10

## 2022-11-14 MED ORDER — SODIUM CHLORIDE 0.9 % IV SOLN: INTRAVENOUS | Status: DC

## 2022-11-14 NOTE — Progress Notes (Signed)
Mobility Specialist - Progress Note  Pre-mobility: 76 bpm HR, 100% SpO2, 22 RR During mobility: 125 bpm HR, 99% SpO2, 28 RR  Post-mobility: 99 bpm HR, 100% SPO2, 25 RR    11/14/22 1433  Oxygen Therapy  O2 Device Nasal Cannula  O2 Flow Rate (L/min) 4 L/min  Patient Activity (if Appropriate) Ambulating  Mobility  Activity Ambulated with assistance in hallway  Level of Assistance Standby assist, set-up cues, supervision of patient - no hands on  Assistive Device Other (Comment) (rollator)  Distance Ambulated (ft) 200 ft  Range of Motion/Exercises Active  Activity Response Tolerated fair  $Mobility charge 1 Mobility   Pt was found on recliner chair and agreeable to ambulate. Had x1 standing break for ~13mn at ~756fstating this distance was where she starts having some back pain and feel SOB. Afterwards ambulated another ~2546fnd took x1 seated rest break for ~2mi81mPt able to ambulate the last ~100ft44fk to room without a break. Left in bed with call bell in reach and RN in room.  BrendFerd Hibbslity Specialist

## 2022-11-14 NOTE — ED Notes (Signed)
ED TO INPATIENT HANDOFF REPORT  ED Nurse Name and Phone #: Cammie Mcgee [phone]  S Name/Age/Gender Debbie Lee 52 y.o. female Room/Bed: RESA/RESA  Code Status   Code Status: Full Code  Home/SNF/Other Home Patient oriented to: self, place, time, and situation Is this baseline? Yes   Triage Complete: Triage complete  Chief Complaint Atrial flutter [facility]) [I48.92]  Triage Note Patient report SOB x 2 days. Pt report worsening SOB today. Pt denies Chest pain. Patient report Hx SVT.   Allergies Allergies  Allergen Reactions   Codeine Other (See Comments)    "Hallucinations. Narcotics cause vomiting."   Sulfa Antibiotics Other (See Comments)    "lifelong allergy"    Level of Care/Admitting Diagnosis ED Disposition     ED Disposition  Admit   Condition  --   [place]: [place] [[id number]]  Level of Care: Progressive [102]  Admit to Progressive based on following criteria: CARDIOVASCULAR & THORACIC of moderate stability with acute coronary syndrome symptoms/low risk myocardial infarction/hypertensive urgency/arrhythmias/heart failure potentially compromising stability and stable post cardiovascular intervention patients.  May place patient in observation at [facility] or [place] if equivalent level of care is available:: No  Covid Evaluation: Confirmed COVID Negative  Diagnosis: Atrial flutter ([place]) [427.32.ICD-9-CM]  Admitting Physician: Toy Baker [[id number]]  Attending Physician: Toy Baker [[id number]]          B Medical/Surgery History Past Medical History:  Diagnosis Date   Anemia    Arthritis    Depression    Hypertension    Panic attack    Past Surgical History:  Procedure Laterality Date   NO PAST SURGERIES     ORIF ANKLE FRACTURE Right 10/29/2019   Procedure: OPEN REDUCTION INTERNAL FIXATION (ORIF) ANKLE FRACTURE;  Surgeon: Renette Butters, MD;  Location: [place];  Service: Orthopedics;  Laterality:  Right;     A IV Location/Drains/Wounds Patient Lines/Drains/Airways Status     Active Line/Drains/Airways     Name Placement date Placement time Site Days   Peripheral IV 11/13/22 20 G Left Antecubital 11/13/22  1801  Antecubital  1   Peripheral IV 11/13/22 20 G Anterior;Proximal;Right Antecubital 11/13/22  2049  Antecubital  1   External Urinary Catheter 11/13/22  2025  --  1   Incision (Closed) 10/29/19 Ankle Right 10/29/19  1042  -- 1112            Intake/Output Last 24 hours No intake or output data in the 24 hours ending 11/14/22 0022  Labs/Imaging Results for orders placed or performed during the hospital encounter of 11/13/22 (from the past 48 hour(s))  CBC with Differential     Status: Abnormal   Collection Time: 11/13/22  5:58 PM  Result Value Ref Range   WBC 11.1 (H) 4.0 - 10.5 K/uL   RBC 4.36 3.87 - 5.11 MIL/uL   Hemoglobin 8.8 (L) 12.0 - 15.0 g/dL    Comment: Reticulocyte Hemoglobin testing may be clinically indicated, consider ordering this additional test [id number]    HCT 32.9 (L) 36.0 - 46.0 %   MCV 75.5 (L) 80.0 - 100.0 fL   MCH 20.2 (L) 26.0 - 34.0 pg   MCHC 26.7 (L) 30.0 - 36.0 g/dL   RDW 19.3 (H) 11.5 - 15.5 %   Platelets 335 150 - 400 K/uL   nRBC 0.2 0.0 - 0.2 %   Neutrophils Relative % 74 %   Neutro Abs 8.3 (H) 1.7 - 7.7 K/uL   Lymphocytes  Relative 18 %   Lymphs Abs 2.0 0.7 - 4.0 K/uL   Monocytes Relative 5 %   Monocytes Absolute 0.5 0.1 - 1.0 K/uL   Eosinophils Relative 2 %   Eosinophils Absolute 0.2 0.0 - 0.5 K/uL   Basophils Relative 0 %   Basophils Absolute 0.0 0.0 - 0.1 K/uL   Immature Granulocytes 1 %   Abs Immature Granulocytes 0.05 0.00 - 0.07 K/uL    Comment: Performed at [facility], [place] [address], [place], [place] [id number]  Basic metabolic panel     Status: Abnormal   Collection Time: 11/13/22  5:58 PM  Result Value Ref Range   Sodium 138 135 - 145 mmol/L   Potassium 4.1 3.5 - 5.1 mmol/L   Chloride 103  98 - 111 mmol/L   CO2 25 22 - 32 mmol/L   Glucose, Bld 192 (H) 70 - 99 mg/dL    Comment: Glucose reference range applies only to samples taken after fasting for at least 8 hours.   BUN 15 6 - 20 mg/dL   Creatinine, Ser 1.01 (H) 0.44 - 1.00 mg/dL   Calcium 8.6 (L) 8.9 - 10.3 mg/dL   GFR, Estimated >60 >60 mL/min    Comment: (NOTE) Calculated using the CKD-EPI Creatinine Equation (2021)    Anion gap 10 5 - 15    Comment: Performed at [facility], [place] [address] [place], [place] [address]  Magnesium     Status: None   Collection Time: 11/13/22  5:58 PM  Result Value Ref Range   Magnesium 1.9 1.7 - 2.4 mg/dL    Comment: Performed at [facility], [place] [address] [place], [place] [address]  TSH     Status: Abnormal   Collection Time: 11/13/22  5:58 PM  Result Value Ref Range   TSH 6.845 (H) 0.350 - 4.500 uIU/mL    Comment: Performed by a 3rd Generation assay with a functional sensitivity of <=0.01 uIU/mL. Performed at [facility], [place] [address], [place], [place] [address]   T4, free     Status: None   Collection Time: 11/13/22  5:58 PM  Result Value Ref Range   Free T4 0.89 0.61 - 1.12 ng/dL    Comment: (NOTE) Biotin ingestion may interfere with free T4 tests. If the results are inconsistent with the TSH level, previous test results, or the clinical presentation, then consider biotin interference. If needed, order repeat testing after stopping biotin. Performed at [facility], [place] [address] [place], [place] [address]   Troponin I (High Sensitivity)     Status: None   Collection Time: 11/13/22  5:58 PM  Result Value Ref Range   Troponin I (High Sensitivity) 4 <18 ng/L    Comment: (NOTE) Elevated high sensitivity troponin I (hsTnI) values and significant  changes across serial measurements may suggest ACS but many other  chronic and acute conditions are known to elevate hsTnI results.  Refer to the "Links"  section for chest pain algorithms and additional  guidance. Performed at [facility], [place] [address], [place], [place] [address]   Brain natriuretic peptide     Status: Abnormal   Collection Time: 11/13/22  5:58 PM  Result Value Ref Range   B Natriuretic Peptide 228.6 (H) 0.0 - 100.0 pg/mL    Comment: Performed at [facility], [place] [address], [place], [place] [address]  hCG, quantitative, pregnancy     Status: None  Collection Time: 11/13/22  6:04 PM  Result Value Ref Range   hCG, Beta Chain, Quant, S <1 <5 mIU/mL    Comment:          GEST. AGE      CONC.  (mIU/mL)   <=1 WEEK        5 - 50     2 WEEKS       50 - 500     3 WEEKS       100 - 10,000     4 WEEKS     1,000 - 30,000     5 WEEKS     3,500 - 115,000   6-8 WEEKS     12,000 - 270,000    12 WEEKS     15,000 - 220,000        FEMALE AND NON-PREGNANT FEMALE:     LESS THAN 5 mIU/mL Performed at [facility], [place] [address], [place], [place] [address]   Resp panel by RT-PCR (RSV, Flu A&B, Covid) Anterior Nasal Swab     Status: None   Collection Time: 11/13/22  6:28 PM   Specimen: Anterior Nasal Swab  Result Value Ref Range   SARS Coronavirus 2 by RT PCR NEGATIVE NEGATIVE    Comment: (NOTE) SARS-CoV-2 target nucleic acids are NOT DETECTED.  The SARS-CoV-2 RNA is generally detectable in upper respiratory specimens during the acute phase of infection. The lowest concentration of SARS-CoV-2 viral copies this assay can detect is 138 copies/mL. A negative result does not preclude SARS-Cov-2 infection and should not be used as the sole basis for treatment or other patient management decisions. A negative result may occur with  improper specimen collection/handling, submission of specimen other than nasopharyngeal swab, presence of viral mutation(s) within the areas targeted by this assay, and inadequate number of viral copies(<138 copies/mL). A negative result must be  combined with clinical observations, patient history, and epidemiological information. The expected result is Negative.  Fact Sheet for Patients:  EntrepreneurPulse.com.au  Fact Sheet for Healthcare Providers:  IncredibleEmployment.be  This test is no t yet approved or cleared by the [place] FDA and  has been authorized for detection and/or diagnosis of SARS-CoV-2 by FDA under an Emergency Use Authorization (EUA). This EUA will remain  in effect (meaning this test can be used) for the duration of the COVID-19 declaration under Section 564(b)(1) of the Act, 21 U.S.C.section 360bbb-3(b)(1), unless the authorization is terminated  or revoked sooner.       Influenza A by PCR NEGATIVE NEGATIVE   Influenza B by PCR NEGATIVE NEGATIVE    Comment: (NOTE) The Xpert Xpress SARS-CoV-2/FLU/RSV plus assay is intended as an aid in the diagnosis of influenza from Nasopharyngeal swab specimens and should not be used as a sole basis for treatment. Nasal washings and aspirates are unacceptable for Xpert Xpress SARS-CoV-2/FLU/RSV testing.  Fact Sheet for Patients: EntrepreneurPulse.com.au  Fact Sheet for Healthcare Providers: IncredibleEmployment.be  This test is not yet approved or cleared by the [place] FDA and has been authorized for detection and/or diagnosis of SARS-CoV-2 by FDA under an Emergency Use Authorization (EUA). This EUA will remain in effect (meaning this test can be used) for the duration of the COVID-19 declaration under Section 564(b)(1) of the Act, 21 U.S.C. section 360bbb-3(b)(1), unless the authorization is terminated or revoked.     Resp Syncytial Virus by PCR NEGATIVE NEGATIVE    Comment: (NOTE) Fact Sheet for Patients: EntrepreneurPulse.com.au  Fact Sheet for Healthcare  Providers: IncredibleEmployment.be  This test is not yet approved or cleared  by the [place] and has been authorized for detection and/or diagnosis of SARS-CoV-2 by FDA under an Emergency Use Authorization (EUA). This EUA will remain in effect (meaning this test can be used) for the duration of the COVID-19 declaration under Section 564(b)(1) of the Act, 21 U.S.C. section 360bbb-3(b)(1), unless the authorization is terminated or revoked.  Performed at [facility], [place] [address] [place], [place] [address]   D-dimer, quantitative     Status: Abnormal   Collection Time: 11/13/22  6:29 PM  Result Value Ref Range   D-Dimer, Quant 1.21 (H) 0.00 - 0.50 ug/mL-FEU    Comment: (NOTE) At the manufacturer cut-off value of 0.5 g/mL FEU, this assay has a negative predictive value of 95-100%.This assay is intended for use in conjunction with a clinical pretest probability (PTP) assessment model to exclude pulmonary embolism (PE) and deep venous thrombosis (DVT) in outpatients suspected of PE or DVT. Results should be correlated with clinical presentation. Performed at [facility], [place] [address] [place], [place] [address]   Iron and TIBC     Status: Abnormal   Collection Time: 11/13/22  6:45 PM  Result Value Ref Range   Iron 29 28 - 170 ug/dL   TIBC 431 250 - 450 ug/dL   Saturation Ratios 7 (L) 10.4 - 31.8 %   UIBC 402 ug/dL    Comment: Performed at [facility], [place] [address] [place], [place] [address]  Ferritin (Iron Binding Protein)     Status: None   Collection Time: 11/13/22  6:45 PM  Result Value Ref Range   Ferritin 14 11 - 307 ng/mL    Comment: Performed at [facility], [place] [address] [place], [place] [address]  Troponin I (High Sensitivity)     Status: None   Collection Time: 11/13/22  8:35 PM  Result Value Ref Range   Troponin I (High Sensitivity) 5 <18 ng/L    Comment: (NOTE) Elevated high sensitivity troponin I (hsTnI) values and significant  changes across  serial measurements may suggest ACS but many other  chronic and acute conditions are known to elevate hsTnI results.  Refer to the "Links" section for chest pain algorithms and additional  guidance. Performed at [facility], [place] [address] [place], [place] [address]   APTT     Status: None   Collection Time: 11/13/22  8:55 PM  Result Value Ref Range   aPTT 29 24 - 36 seconds    Comment: Performed at [facility], [place] [address], [place], [place] [address]  Protime-INR     Status: Abnormal   Collection Time: 11/13/22  8:55 PM  Result Value Ref Range   Prothrombin Time 17.5 (H) 11.4 - 15.2 seconds   INR 1.4 (H) 0.8 - 1.2    Comment: (NOTE) INR goal varies based on device and disease states. Performed at [facility], [place] [address] [place], [place] [address]    DG Chest Portable 1 View  Result Date: 11/13/2022 CLINICAL DATA:  Provided history: Shortness of breath. Chest pain. History of SVT. EXAM: PORTABLE CHEST 1 VIEW COMPARISON:  Prior chest radiographs 08/15/2022 and earlier. FINDINGS: Cardiac monitoring pads overlie the chest. Heart size within normal limits. No appreciable airspace consolidation or pulmonary edema. No evidence of pleural effusion or pneumothorax. No acute osseous abnormality identified. IMPRESSION: No evidence of active cardiopulmonary disease.  Electronically Signed   By: Kellie Simmering D.O.   On: 11/13/2022 18:19    Pending Labs Unresulted Labs (From admission, onward)     Start     Ordered   11/14/22 0500  CBC  Daily,   R      11/13/22 2057   11/14/22 0500  Prealbumin  Tomorrow morning,   R        11/13/22 2306   11/14/22 0400  Heparin level (unfractionated)  Once-Timed,   URGENT        11/13/22 2156   11/13/22 2338  CBC  Now then every 6 hours,   R (with TIMED occurrences)     Question:  Release to patient  Answer:  Immediate   11/13/22 2337   11/13/22 2320  Type and screen [place]  Once,   R       Comments: [place]    11/13/22 2319   11/13/22 2314  Hemoglobin A1c  Once,   R       Comments: To assess prior glycemic control    11/13/22 2313   11/13/22 2306  CK  Add-on,   AD        11/13/22 2306   11/13/22 2306  Hepatic function panel  Add-on,   AD       Question:  Release to patient  Answer:  Immediate   11/13/22 2306   11/13/22 2306  Phosphorus  Add-on,   AD        11/13/22 2306   11/13/22 2306  Urinalysis, Complete w Microscopic -Urine, Clean Catch  Once,   URGENT       Question Answer Comment  Release to patient Immediate   Specimen Source Urine, Clean Catch      11/13/22 2306   11/13/22 2306  Vitamin B12  (Anemia Panel (PNL))  Once,   URGENT        11/13/22 2306   11/13/22 2306  Folate  (Anemia Panel (PNL))  Once,   URGENT        11/13/22 2306   11/13/22 2306  Reticulocytes  (Anemia Panel (PNL))  Once,   URGENT        11/13/22 2306   11/13/22 2306  T3  Add-on,   AD       Question:  Release to patient  Answer:  Immediate   11/13/22 2306   11/13/22 1848  Occult blood card to lab, stool  Once,   URGENT        11/13/22 1847   Unscheduled  Occult blood card to lab, stool RN will collect  As needed,   R     Question Answer Comment  Specimen to be collected by: RN will collect   Release to patient Immediate      11/13/22 2313   Signed and Held  HIV Antibody (routine testing w rflx)  (HIV Antibody (Routine testing w reflex) panel)  Once,   R        Signed and Held   Signed and Held  Comprehensive metabolic panel  Tomorrow morning,   R       Question:  Release to patient  Answer:  Immediate   Signed and Held   Signed and Held  CBC  Tomorrow morning,   R       Question:  Release to patient  Answer:  Immediate   Signed and Held   Signed and Held  Magnesium  Tomorrow morning,  R        Signed and Held   Signed and Held  Phosphorus  Tomorrow morning,   R        Signed and Held            Vitals/Pain Today's Vitals    11/13/22 2215 11/13/22 2224 11/13/22 2302 11/13/22 2308  BP: (!) 127/108  (!) 138/91   Pulse: (!) 124 (!) 134 (!) 117   Resp: (!) 24 (!) 34 (!) 22   Temp:    98.2 F (36.8 C)  TempSrc:    Oral  SpO2: 98% 99% 100%   Weight:      Height:      PainSc:   0-No pain     Isolation Precautions Airborne and Contact precautions  Medications Medications  adenosine (ADENOCARD) 6 MG/2ML injection (  Not Given 11/13/22 1820)  diltiazem (CARDIZEM) 125 mg in dextrose 5% 125 mL (1 mg/mL) infusion (15 mg/hr Intravenous Rate/Dose Change 11/13/22 2136)  heparin bolus via infusion 4,000 Units (4,000 Units Intravenous Bolus from Bag 11/13/22 2142)    Followed by  heparin ADULT infusion 100 units/mL (25000 units/261m) (1,300 Units/hr Intravenous New Bag/Given 11/13/22 2140)  insulin aspart (novoLOG) injection 0-9 Units (has no administration in time range)  propranolol (INDERAL) tablet 40 mg (has no administration in time range)  diltiazem (CARDIZEM) injection 10 mg (10 mg Intravenous Given 11/13/22 1829)  diltiazem (CARDIZEM CD) 24 hr capsule 180 mg (180 mg Oral Given 11/13/22 1925)  iohexol (OMNIPAQUE) 350 MG/ML injection 100 mL (100 mLs Intravenous Contrast Given 11/13/22 2249)    Mobility walks with device     Focused Assessments See chart   R Recommendations: See Admitting Provider Note  Report given to:   Additional Notes: N/a

## 2022-11-14 NOTE — H&P (View-Only) (Signed)
Referring Provider: [facility] Primary Care Physician:  Azzie Glatter, FNP Primary Gastroenterologist:  Althia Forts  Reason for Consultation: Anemia  HPI: Debbie Lee is a 52 y.o. female medical history significant of a flutter not on anticoagulation presents for evaluation of anemia  No previous EGD/colonoscopy Hgb 8.0 (10.1 3 months ago)  Patient reports ongoing fatigue, shortness of breath. States she is concerned about the issue with her heart. Reports ongoing intermittent anemia for many years. Previously had heavy periods, but since nexplanonon placement she has not had any bleeding with cycles. Reports longstanding history of constipation. Reports increased gas, epigastric discomfort, belching, and heartburn. Denies melena/hematochezia   Past Medical History:  Diagnosis Date   Anemia    Arthritis    Depression    Hypertension    Panic attack     Past Surgical History:  Procedure Laterality Date   NO PAST SURGERIES     ORIF ANKLE FRACTURE Right 10/29/2019   Procedure: OPEN REDUCTION INTERNAL FIXATION (ORIF) ANKLE FRACTURE;  Surgeon: Renette Butters, MD;  Location: [place];  Service: Orthopedics;  Laterality: Right;    Prior to Admission medications   Medication Sig Start Date End Date Taking? Authorizing Provider  DULoxetine (CYMBALTA) 60 MG capsule Take 60 mg by mouth daily. 10/22/20  Yes [provider]  omeprazole (PRILOSEC) 20 MG capsule Take 20 mg by mouth daily as needed (indigestion).   Yes [provider]  propranolol (INDERAL) 40 MG tablet Take 40 mg by mouth 2 (two) times daily. 06/20/19  Yes [provider]  albuterol (VENTOLIN HFA) 108 (90 Base) MCG/ACT inhaler Inhale 2 puffs into the lungs every 6 (six) hours as needed for wheezing or shortness of breath. Patient not taking: Reported on 11/13/2022 06/29/20   [provider]  aspirin EC 81 MG tablet Take 1 tablet (81 mg total) by mouth 2 (two) times daily. For DVT prophylaxis for 30  days after surgery. Patient not taking: Reported on 11/16/2020 10/29/19   Prudencio Burly III, PA-C  diltiazem (CARDIZEM CD) 180 MG 24 hr capsule Take 1 capsule (180 mg total) by mouth daily. Patient not taking: Reported on 11/13/2022 08/15/22   Tedd Sias, PA  docusate sodium (COLACE) 100 MG capsule Take 1 capsule (100 mg total) by mouth 2 (two) times daily. To prevent constipation while taking pain medication. Patient not taking: Reported on 11/16/2020 10/29/19   Prudencio Burly III, PA-C  methocarbamol (ROBAXIN) 500 MG tablet Take 1 tablet (500 mg total) by mouth every 8 (eight) hours as needed for muscle spasms. Patient not taking: Reported on 11/16/2020 10/29/19   Prudencio Burly III, PA-C  metoprolol tartrate (LOPRESSOR) 25 MG tablet Take 1 tablet (25 mg total) by mouth once as needed for up to 1 dose (FOR SEVERE PALPITATIONS WHEN SBP > 100 MMHG). Patient not taking: Reported on 11/13/2022 01/23/21   Varney Biles, MD  norgestimate-ethinyl estradiol (ORTHO-CYCLEN,SPRINTEC,PREVIFEM) 0.25-35 MG-MCG tablet Take 1 tablet by mouth daily. Patient not taking: Reported on 10/28/2019 07/01/15   Rasch, Anderson Malta I, NP  ondansetron (ZOFRAN ODT) 4 MG disintegrating tablet Take 1 tablet (4 mg total) by mouth every 8 (eight) hours as needed for nausea or vomiting. Patient not taking: Reported on 11/16/2020 10/28/19   Suella Broad A, PA-C    Scheduled Meds:  DULoxetine  60 mg Oral Daily   insulin aspart  0-9 Units Subcutaneous Q4H   pantoprazole (PROTONIX) IV  40 mg Intravenous Q12H   propranolol  40 mg  Oral BID   Continuous Infusions:  sodium chloride 100 mL/hr at 11/14/22 0511   diltiazem (CARDIZEM) infusion 12.5 mg/hr (11/14/22 0645)   heparin 1,300 Units/hr (11/14/22 0511)   PRN Meds:.acetaminophen **OR** acetaminophen, HYDROcodone-acetaminophen  Allergies as of 11/13/2022 - Review Complete 11/13/2022  Allergen Reaction Noted   Codeine Other (See Comments) 02/16/2014   Sulfa  antibiotics Other (See Comments) 02/16/2014    Family History  Adopted: Yes    Social History   Socioeconomic History   Marital status: Married    Spouse name: Not on file   Number of children: Not on file   Years of education: Not on file   Highest education level: Not on file  Occupational History   Not on file  Tobacco Use   Smoking status: Former    Years: 22.00    Types: Cigarettes   Smokeless tobacco: Never  Vaping Use   Vaping Use: Never used  Substance and Sexual Activity   Alcohol use: Not Currently    Comment: 4/year   Drug use: No   Sexual activity: Not Currently    Birth control/protection: Implant  Other Topics Concern   Not on file  Social History Narrative   Not on file   Social Determinants of Health   Financial Resource Strain: Not on file  Food Insecurity: Food Insecurity Present (11/14/2022)   Hunger Vital Sign    Worried About Running Out of Food in the Last Year: Sometimes true    Ran Out of Food in the Last Year: Sometimes true  Transportation Needs: Unmet Transportation Needs (11/14/2022)   PRAPARE - Hydrologist (Medical): Yes    Lack of Transportation (Non-Medical): No  Physical Activity: Not on file  Stress: Not on file  Social Connections: Not on file  Intimate Partner Violence: Not At Risk (11/14/2022)   Humiliation, Afraid, Rape, and Kick questionnaire    Fear of Current or Ex-Partner: No    Emotionally Abused: No    Physically Abused: No    Sexually Abused: No    Review of Systems: All negative except as stated above in HPI.  Physical Exam:Physical Exam Constitutional:      General: She is not in acute distress.    Appearance: She is obese. She is not ill-appearing.  HENT:     Head: Normocephalic.     Nose: Congestion present.     Mouth/Throat:     Mouth: Mucous membranes are moist.     Pharynx: Oropharynx is clear.  Eyes:     General: No scleral icterus.    Extraocular Movements: Extraocular  movements intact.  Cardiovascular:     Rate and Rhythm: Normal rate and regular rhythm.  Pulmonary:     Effort: Pulmonary effort is normal. No respiratory distress.  Abdominal:     General: There is distension.     Palpations: Abdomen is soft. There is no mass.     Tenderness: There is no abdominal tenderness. There is no rebound.     Hernia: No hernia is present.  Musculoskeletal:        General: No swelling. Normal range of motion.     Cervical back: Normal range of motion and neck supple.  Skin:    General: Skin is warm and dry.  Neurological:     General: No focal deficit present.     Mental Status: She is alert and oriented to person, place, and time.  Psychiatric:  Mood and Affect: Mood normal.        Behavior: Behavior normal.        Thought Content: Thought content normal.        Judgment: Judgment normal.     Vital signs: Vitals:   11/14/22 0505 11/14/22 0651  BP:    Pulse: 74   Resp: (!) 21   Temp:  98.2 F (36.8 C)  SpO2:     Last BM Date :  (pta)    GI:  Lab Results: Recent Labs    11/14/22 0030 11/14/22 0248 11/14/22 0533  WBC 14.1* 13.0* 12.8*  HGB 8.7* 8.3* 8.0*  HCT 32.8* 30.8* 29.3*  PLT 327 324 295   BMET Recent Labs    11/13/22 1758 11/14/22 0533  NA 138 134*  K 4.1 3.8  CL 103 100  CO2 25 25  GLUCOSE 192* 176*  BUN 15 14  CREATININE 1.01* 0.81  CALCIUM 8.6* 8.0*   LFT Recent Labs    11/14/22 0248 11/14/22 0533  PROT 6.7 6.6  ALBUMIN 3.0* 3.1*  AST 28 33  ALT 29 29  ALKPHOS 69 71  BILITOT 1.3* 1.4*  BILIDIR 0.2  --   IBILI 1.1*  --    PT/INR Recent Labs    11/13/22 2055  LABPROT 17.5*  INR 1.4*     Studies/Results: DG Abd 1 View  Result Date: 11/14/2022 CLINICAL DATA:  History of constipation EXAM: ABDOMEN - 1 VIEW COMPARISON:  None Available. FINDINGS: Bladder is partially distended with opacified urine. Scattered large and small bowel gas is noted. No obstructive changes are seen. No significant  fecal retention to suggest constipation is noted. No free air is seen. IMPRESSION: No acute abnormality noted. Electronically Signed   By: Inez Catalina M.D.   On: 11/14/2022 00:05   CT Angio Chest PE W and/or Wo Contrast  Result Date: 11/13/2022 CLINICAL DATA:  Concern for pulmonary embolism. EXAM: CT ANGIOGRAPHY CHEST WITH CONTRAST TECHNIQUE: Multidetector CT imaging of the chest was performed using the standard protocol during bolus administration of intravenous contrast. Multiplanar CT image reconstructions and MIPs were obtained to evaluate the vascular anatomy. RADIATION DOSE REDUCTION: This exam was performed according to the departmental dose-optimization program which includes automated exposure control, adjustment of the mA and/or kV according to patient size and/or use of iterative reconstruction technique. CONTRAST:  194m OMNIPAQUE IOHEXOL 350 MG/ML SOLN COMPARISON:  Chest radiograph dated 11/13/2022. FINDINGS: Evaluation of this exam is limited due to respiratory motion artifact. Cardiovascular: There is no cardiomegaly or pericardial effusion. The thoracic aorta is unremarkable. Evaluation of the pulmonary arteries is limited due to respiratory motion. No obvious large or central pulmonary artery embolus identified. Mediastinum/Nodes: Right hilar adenopathy measures 13 mm short axis. The esophagus is grossly unremarkable. No mediastinal fluid collection. Lungs/Pleura: There is scattered hazy and ground-glass density throughout the lungs with mosaic appearance, likely related to underlying small airway or small vessel disease. No focal consolidation. There is no pleural effusion or pneumothorax. The central airways are patent. Upper Abdomen: Fatty liver. Musculoskeletal: Mild degenerative changes. No acute osseous pathology. Review of the MIP images confirms the above findings. IMPRESSION: 1. No acute intrathoracic pathology. No obvious large or central pulmonary artery embolus identified. 2. Fatty  liver. 3. Right hilar adenopathy, likely reactive. Electronically Signed   By: AAnner CreteM.D.   On: 11/13/2022 23:22   DG Chest Portable 1 View  Result Date: 11/13/2022 CLINICAL DATA:  Provided history: Shortness of breath. Chest  pain. History of SVT. EXAM: PORTABLE CHEST 1 VIEW COMPARISON:  Prior chest radiographs 08/15/2022 and earlier. FINDINGS: Cardiac monitoring pads overlie the chest. Heart size within normal limits. No appreciable airspace consolidation or pulmonary edema. No evidence of pleural effusion or pneumothorax. No acute osseous abnormality identified. IMPRESSION: No evidence of active cardiopulmonary disease. Electronically Signed   By: Kellie Simmering D.O.   On: 11/13/2022 18:19    Impression: Symptomatic anemia - Hgb 8.0 (10.1 3 months ago, normal one year ago) -BUN 14, creatinine 0.81 -Abdominal x-ray negative  A-flutter  Plan: Plan for EGD tomorrow. I thoroughly discussed the procedures to include nature, alternatives, benefits, and risks including but not limited to bleeding, perforation, infection, anesthesia/cardiac and pulmonary complications. Patient provides understanding and gave verbal consent to proceed. Continue Protonix IV 75m BID Continue clear liquid diet NPO at midnight [facility] will follow.       LOS: 0 days   BGarnette Scheuermann PA-C 11/14/2022, 8:46 AM  Contact #  [phone]

## 2022-11-14 NOTE — Progress Notes (Signed)
ANTICOAGULATION CONSULT NOTE - Follow Up Consult  Pharmacy Consult for heparin Indication: atrial fibrillation with RVR  Allergies  Allergen Reactions   Codeine Other (See Comments)    "Hallucinations. Narcotics cause vomiting."   Sulfa Antibiotics Other (See Comments)    "lifelong allergy"    Patient Measurements: Height: 5' 2"$  (157.5 cm) Weight: (!) 140.6 kg (310 lb) IBW/kg (Calculated) : 50.1 Heparin Dosing Weight: 86 kg  Vital Signs: Temp: 97.7 F (36.5 C) (02/19 1009) Temp Source: Oral (02/19 1009) BP: 122/72 (02/19 1009) Pulse Rate: 75 (02/19 1009)  Labs: Recent Labs    11/13/22 1758 11/13/22 2035 11/13/22 2055 11/14/22 0030 11/14/22 0248 11/14/22 0533 11/14/22 0919 11/14/22 0930  HGB 8.8*  --   --  8.7* 8.3* 8.0* 8.0*  --   HCT 32.9*  --   --  32.8* 30.8* 29.3* 29.4*  --   PLT 335  --   --  327 324 295 286  --   APTT  --   --  29  --   --   --   --   --   LABPROT  --   --  17.5*  --   --   --   --   --   INR  --   --  1.4*  --   --   --   --   --   HEPARINUNFRC  --   --   --  <0.10*  --  <0.10*  --  <0.10*  CREATININE 1.01*  --   --   --   --  0.81  --   --   CKTOTAL  --   --   --   --  42  --   --   --   TROPONINIHS 4 5  --   --   --   --   --   --     Estimated Creatinine Clearance: 114.5 mL/min (by C-G formula based on SCr of 0.81 mg/dL).    Assessment: Patient's a 52 y.o F with hx afib who presented to the ED on 11/13/22 with c/o SOB and intermittent rectal bleeding with bowel movements. Chest CT was neg for PE.  Heparin drip started on 11/13/22 for afib with RVR.   Today, 11/14/2022: - heparin level collected at 9:30a is undetectable. Per pt's RN, no issue with IV line and no bleeding noted at this time - hgb 8, plts ok.  - GI team is seeing pt and working her up for anemia.  Per Bayley McMicheal  (PA-C) via [facility] msg, pt is scheduled for EGD/colonoscopy on 11/15/22 at 12:30p and heparin drip needs to be stopped 6 hrs prior to the procedure.  Goal of  Therapy:  Heparin level 0.3-0.7 units/ml Monitor platelets by anticoagulation protocol: Yes   Plan:  - increase heparin drip to 1600 units/hr - check 6 hr heparin level - monitor for s/sx bleeding - stop heparin drip at 6:30a on 11/15/22 (~6 hrs prior to GI procedure)  Debbie Lee P 11/14/2022,11:43 AM

## 2022-11-14 NOTE — Progress Notes (Signed)
Lower extremity venous bilateral study completed.   Please see CV Proc for preliminary results.   Traivon Morrical, RDMS, RVT  

## 2022-11-14 NOTE — Progress Notes (Signed)
Echocardiogram 2D Echocardiogram has been performed.  Debbie Lee 11/14/2022, 1:53 PM

## 2022-11-14 NOTE — Inpatient Diabetes Management (Signed)
Inpatient Diabetes Program Recommendations  AACE/ADA: New Consensus Statement on Inpatient Glycemic Control (2015)  Target Ranges:  Prepandial:   less than 140 mg/dL      Peak postprandial:   less than 180 mg/dL (1-2 hours)      Critically ill patients:  140 - 180 mg/dL   Lab Results  Component Value Date   GLUCAP 164 (H) 11/14/2022   HGBA1C 9.3 (H) 11/14/2022    Review of Glycemic Control  Diabetes history: New onset DM Outpatient Diabetes medications: None, had been on metformin at one time Current orders for Inpatient glycemic control: Novolog 0-9 units Q4H  HgbA1C - 9.3%  Inpatient Diabetes Program Recommendations:    For discharge:  Novolin ReliOn flexpen 70/30 15 units BID [id number])  Blood glucose meter kit [id number])  Insulin pen needles [id number])  Spoke with patient about new diabetes diagnosis.  Discussed A1C results (9.3%) and explained what an A1C is and informed patient that his current A1C indicates an average glucose of 220 mg/dl over the past 2-3 months. Discussed basic pathophysiology of DM Type 2, basic home care, importance of checking CBGs and maintaining good CBG control to prevent long-term and short-term complications. Reviewed glucose and A1C goals. Reviewed signs and symptoms of hyperglycemia and hypoglycemia along with treatment for both. Discussed impact of nutrition, exercise, stress, sickness, and medications on diabetes control. Ordered Living Well with diabetes booklet and encouraged patient to read through entire book. Informed patient that she will be prescribed Novolin 70/30 since it is more affordable and will take before breakfast and before dinner. Pt is agreeable.   Reviewed insulin pen use at home. Reviewed all steps if insulin pen including attachment of needle, 2-unit air shot, dialing up dose, giving injection, removing needle, disposal of sharps, storage of unused insulin, disposal of insulin etc. Patient able to provide successful return  demonstration. Also reviewed troubleshooting with insulin pen. MD to give patient Rxs for insulin pens and insulin pen needles.  Pt instructed to f/u with PCP. Seems motivated to make lifestyle changes.   Thank you. Lorenda Peck, RD, LDN, [place] Inpatient Diabetes Coordinator [phone]

## 2022-11-14 NOTE — Progress Notes (Signed)
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 propranolol TSH 6.8 Free T4 normal A1c 9.3  GI consulted because of low heme globin  Hospital-Problem based course  SVT/a flutter 2-1 -Defer to cardiology whether we should consolidate Cardizem gtt. - Continues propranolol 40 twice daily - Cardioversion?  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 Rosalie Gums tomorrow and possible colonoscopy if no source-has never had a colonoscopy previously - Cancel every 6 CBCs checked daily only as no overt bleed  Very likely OSA - Auto titration overnight to see if she tolerates - Will asked that CP coordinate this in the outpatient setting  Mild leukocytosis etiology unclear - Does have some MASD -Will add nystatin  New onset DM TY 2 A1c 9.3 - Diabetic coordinator input integral, CBGs are 160-200 - Will need 70/30 insulin which is affordable and will start once discussed with DM coordinator - Will start metformin 500 twice daily in a.m. as long as no plans for  invasive strategy per cardiology  Depression - Continue Cymbalta DR 60 daily  DVT prophylaxis:  Code Status:  Family Communication:  Disposition:  Status is: Observation The patient will require care spanning > 2 midnights and should be moved to inpatient because:    Subjective: Pleasant coherent feels short of breath on taking off oxygen however she does not desat No chest pain at this time no nausea no vomiting no fever has not eaten much other than clears  Objective: Vitals:   11/14/22 0505 11/14/22 0651 11/14/22 0904 11/14/22 1009  BP:   110/76 122/72  Pulse: 74  86 75  Resp: (!) 21   19  Temp:  98.2 F (36.8 C)  97.7 F (36.5 C)  TempSrc:  Oral  Oral  SpO2:    100%  Weight:      Height:        Intake/Output Summary (Last 24 hours) at 11/14/2022 1131 Last data filed at 11/14/2022 1055 Gross per 24 hour  Intake 369.31 ml  Output 350 ml  Net 19.31 ml   Filed Weights   11/13/22 1721  Weight: (!) 140.6 kg    Examination:  Awake coherent no distress Thick neck Mallampati 4 No thrush No icterus or pallor S1-S2 seems to be in sinus rhythm on monitors Chest is clear posterolaterally no wheeze rales rhonchi Multiple tattoos She does have abdominal and pannus pitting edema as well as upper thigh edema and she feels that her feet are more swollen than usual  Data Reviewed: personally reviewed   CBC  Component Value Date/Time   WBC 11.2 (H) 11/14/2022 0919   RBC 3.90 11/14/2022 0919   HGB 8.0 (L) 11/14/2022 0919   HCT 29.4 (L) 11/14/2022 0919   PLT 286 11/14/2022 0919   MCV 75.4 (L) 11/14/2022 0919   MCH 20.5 (L) 11/14/2022 0919   MCHC 27.2 (L) 11/14/2022 0919   RDW 19.3 (H) 11/14/2022 0919   LYMPHSABS 2.0 11/13/2022 1758   MONOABS 0.5 11/13/2022 1758   EOSABS 0.2 11/13/2022 1758   BASOSABS 0.0 11/13/2022 1758      Latest Ref Rng & Units 11/14/2022    5:33 AM 11/14/2022    2:48 AM 11/13/2022    5:58 PM  CMP  Glucose 70 - 99 mg/dL 176   192   BUN 6  - 20 mg/dL 14   15   Creatinine 0.44 - 1.00 mg/dL 0.81   1.01   Sodium 135 - 145 mmol/L 134   138   Potassium 3.5 - 5.1 mmol/L 3.8   4.1   Chloride 98 - 111 mmol/L 100   103   CO2 22 - 32 mmol/L 25   25   Calcium 8.9 - 10.3 mg/dL 8.0   8.6   Total Protein 6.5 - 8.1 g/dL 6.6  6.7    Total Bilirubin 0.3 - 1.2 mg/dL 1.4  1.3    Alkaline Phos 38 - 126 U/L 71  69    AST 15 - 41 U/L 33  28    ALT 0 - 44 U/L 29  29       Radiology Studies: DG Abd 1 View  Result Date: 11/14/2022 CLINICAL DATA:  History of constipation EXAM: ABDOMEN - 1 VIEW COMPARISON:  None Available. FINDINGS: Bladder is partially distended with opacified urine. Scattered large and small bowel gas is noted. No obstructive changes are seen. No significant fecal retention to suggest constipation is noted. No free air is seen. IMPRESSION: No acute abnormality noted. Electronically Signed   By: Inez Catalina M.D.   On: 11/14/2022 00:05   CT Angio Chest PE W and/or Wo Contrast  Result Date: 11/13/2022 CLINICAL DATA:  Concern for pulmonary embolism. EXAM: CT ANGIOGRAPHY CHEST WITH CONTRAST TECHNIQUE: Multidetector CT imaging of the chest was performed using the standard protocol during bolus administration of intravenous contrast. Multiplanar CT image reconstructions and MIPs were obtained to evaluate the vascular anatomy. RADIATION DOSE REDUCTION: This exam was performed according to the departmental dose-optimization program which includes automated exposure control, adjustment of the mA and/or kV according to patient size and/or use of iterative reconstruction technique. CONTRAST:  138m OMNIPAQUE IOHEXOL 350 MG/ML SOLN COMPARISON:  Chest radiograph dated 11/13/2022. FINDINGS: Evaluation of this exam is limited due to respiratory motion artifact. Cardiovascular: There is no cardiomegaly or pericardial effusion. The thoracic aorta is unremarkable. Evaluation of the pulmonary arteries is limited due to respiratory motion. No obvious large  or central pulmonary artery embolus identified. Mediastinum/Nodes: Right hilar adenopathy measures 13 mm short axis. The esophagus is grossly unremarkable. No mediastinal fluid collection. Lungs/Pleura: There is scattered hazy and ground-glass density throughout the lungs with mosaic appearance, likely related to underlying small airway or small vessel disease. No focal consolidation. There is no pleural effusion or pneumothorax. The central airways are patent. Upper Abdomen: Fatty liver. Musculoskeletal: Mild degenerative changes. No acute osseous pathology. Review of the MIP images confirms the above findings. IMPRESSION: 1. No acute intrathoracic pathology. No obvious large or central pulmonary artery embolus identified. 2. Fatty liver. 3. Right hilar  adenopathy, likely reactive. Electronically Signed   By: Anner Crete M.D.   On: 11/13/2022 23:22   DG Chest Portable 1 View  Result Date: 11/13/2022 CLINICAL DATA:  Provided history: Shortness of breath. Chest pain. History of SVT. EXAM: PORTABLE CHEST 1 VIEW COMPARISON:  Prior chest radiographs 08/15/2022 and earlier. FINDINGS: Cardiac monitoring pads overlie the chest. Heart size within normal limits. No appreciable airspace consolidation or pulmonary edema. No evidence of pleural effusion or pneumothorax. No acute osseous abnormality identified. IMPRESSION: No evidence of active cardiopulmonary disease. Electronically Signed   By: Kellie Simmering D.O.   On: 11/13/2022 18:19     Scheduled Meds:  DULoxetine  60 mg Oral Daily   insulin aspart  0-9 Units Subcutaneous Q4H   pantoprazole (PROTONIX) IV  40 mg Intravenous Q12H   propranolol  40 mg Oral BID   Continuous Infusions:  sodium chloride 100 mL/hr at 11/14/22 0511   diltiazem (CARDIZEM) infusion 12.5 mg/hr (11/14/22 0645)   heparin 1,300 Units/hr (11/14/22 0511)     LOS: 0 days   Time spent: [place], MD [facility] To contact the attending provider between 7A-7P  or the covering provider during after hours 7P-7A, please log into the web site [email or web address] and access using universal [place] password for that web site. If you do not have the password, please call the hospital operator.  11/14/2022, 11:31 AM

## 2022-11-14 NOTE — TOC Initial Note (Incomplete)
Transition of Care [facility]) - Initial/Assessment Note    Patient Details  Name: Debbie Lee MRN: [id number] Date of Birth: 12-Mar-1971  Transition of Care [facility]) CM/SW Contact:    Dessa Phi, RN Phone Number: 11/14/2022, 3:05 PM  Clinical Narrative: On 02 may need. Will monitor.                  Expected Discharge Plan: Home/Self Care Barriers to Discharge: Continued Medical Work up   Patient Goals and CMS Choice            Expected Discharge Plan and Services                                              Prior Living Arrangements/Services                       Activities of Daily Living Home Assistive Devices/Equipment: Bedside commode/3-in-1, CPAP, Eyeglasses, Hand-held shower hose, Grab bars around toilet ADL Screening (condition at time of admission) Patient's cognitive ability adequate to safely complete daily activities?: Yes Is the patient deaf or have difficulty hearing?: No Does the patient have difficulty seeing, even when wearing glasses/contacts?: No Does the patient have difficulty concentrating, remembering, or making decisions?: No Patient able to express need for assistance with ADLs?: Yes Does the patient have difficulty dressing or bathing?: No Independently performs ADLs?: Yes (appropriate for developmental age) Does the patient have difficulty walking or climbing stairs?: Yes Weakness of Legs: Right Weakness of Arms/Hands: Both  Permission Sought/Granted                  Emotional Assessment              Admission diagnosis:  Atrial flutter ([place]) [I48.92] Microcytic anemia [D50.9] Atrial flutter with rapid ventricular response ([place]) [I48.92] Patient Active Problem List   Diagnosis Date Noted   Atrial flutter ([place]) 11/13/2022   DM (diabetes mellitus), type 2 ([place]) 11/13/2022   Obesity, Class III, BMI 40-49.9 (morbid obesity) ([place]) 11/13/2022   Anemia 11/13/2022   Elevated TSH 11/13/2022   Constipation  11/13/2022   Essential hypertension 11/13/2022   Closed right ankle fracture 10/29/2019   PCP:  Azzie Glatter, FNP Pharmacy:   [facility], [place] [address] Phone: [phone] Fax: [address] [place] [address] Phone: [phone] Fax: [phone]  [facility] [id number] [place], [place] - [place] [address] Phone: [phone] Fax: [phone]  [id number] [place] [place]: [phone]: [phone] [id number] [address] [id number] [id number]: [phone]: [phone]   Social Determinants of Health (SDOH) Social History: [place] Insecurity Present (11/14/2022)  Housing: Medium Risk (11/14/2022)  Transportation Needs: Unmet Transportation Needs (11/14/2022)  Utilities: Not At Risk (11/14/2022)  Tobacco Use: Medium Risk (11/14/2022)   SDOH Interventions: Housing Interventions: Intervention Not Indicated, AMB Referral, Inpatient TOC   Readmission Risk Interventions     No data to display

## 2022-11-14 NOTE — Progress Notes (Signed)
ANTICOAGULATION CONSULT NOTE - Follow Up Consult  Pharmacy Consult for heparin Indication: atrial flutter  Allergies  Allergen Reactions   Codeine Other (See Comments)    "Hallucinations. Narcotics cause vomiting."   Sulfa Antibiotics Other (See Comments)    "lifelong allergy"    Patient Measurements: Height: 5' 2"$  (157.5 cm) Weight: (!) 140.6 kg (310 lb) IBW/kg (Calculated) : 50.1 Heparin Dosing Weight: 86 kg  Vital Signs: Temp: 97.7 F (36.5 C) (02/19 1009) Temp Source: Oral (02/19 1009) BP: 122/72 (02/19 1009) Pulse Rate: 75 (02/19 1009)  Labs: Recent Labs    11/13/22 1758 11/13/22 2035 11/13/22 2055 11/14/22 0030 11/14/22 0248 11/14/22 0533 11/14/22 0919 11/14/22 0930 11/14/22 1822  HGB 8.8*  --   --    < > 8.3* 8.0* 8.0*  --   --   HCT 32.9*  --   --    < > 30.8* 29.3* 29.4*  --   --   PLT 335  --   --    < > 324 295 286  --   --   APTT  --   --  29  --   --   --   --   --   --   LABPROT  --   --  17.5*  --   --   --   --   --   --   INR  --   --  1.4*  --   --   --   --   --   --   HEPARINUNFRC  --   --   --    < >  --  <0.10*  --  <0.10* <0.10*  CREATININE 1.01*  --   --   --   --  0.81  --   --   --   CKTOTAL  --   --   --   --  42  --   --   --   --   TROPONINIHS 4 5  --   --   --   --   --   --   --    < > = values in this interval not displayed.     Estimated Creatinine Clearance: 114.5 mL/min (by C-G formula based on SCr of 0.81 mg/dL).    Assessment: 52 y.o F with hx of atrial flutter who presented to the ED on 11/13/22 with c/o SOB and intermittent rectal bleeding with bowel movements. Chest CT was negative for PE. Heparin infusion started on 11/13/22 for atrial flutter.   Today, 11/14/2022: - 1822 heparin level remains undetectable. Discussed with RN and phlebotomist. Per day shift RN, heparin infusing in right forearm, and left arm unable to be used for lab draws. RN paused heparin for > 15 minutes (appears paused ~ 27 minutes based on  charting) prior to heparin level being collected. Per phlebotomist, level was collected from right wrist. Per discussion with night shift RN, RN clarified that left arm unable to be used for BP measurements, but ok for lab draws. She will alert phlebotomist as well to collect heparin level from opposite arm from where heparin infusing.  - No bleeding issues or IV site issues per nursing  - Hgb 8, Plt WNL  Significant events: - 2/19: GI team is seeing pt and working her up for anemia.  Per Bayley McMicheal  (PA-C) via [place] msg, pt is scheduled for EGD on 11/15/22 at 12:30p, and heparin drip needs to be  stopped 6 hours prior to the procedure.  Goal of Therapy:  Heparin level 0.3-0.7 units/ml Monitor platelets by anticoagulation protocol: Yes   Plan:  - No adjustment at this time. Low heparin level likely reflective of pausing heparin for significant period of time. Discussed with nurse and phlebotomist. Repeat heparin level ~ midnight to determine need for further rate changes.   - Stop heparin infusion at 0630 on 11/15/22 (~6 hrs prior to GI procedure) per GI recommendations    Lindell Spar, PharmD, BCPS Clinical Pharmacist 11/14/2022,7:36 PM

## 2022-11-14 NOTE — Progress Notes (Signed)
Lower extremity venous attempted. Patient in chair, preparing to work with mobility specialist. Spoke with mobility regarding transfer of patient to bed for DVT study after.   Darlin Coco, RDMS, RVT

## 2022-11-14 NOTE — Hospital Course (Signed)
1. Left ventricular ejection fraction, by estimation, is 30 to 35%. The  left ventricle has moderately decreased function. The left ventricle  demonstrates global hypokinesis. Left ventricular diastolic function could  not be evaluated.   2. Right ventricular systolic function is moderately reduced. The right  ventricular size is normal. There is normal pulmonary artery systolic  pressure.   3. Left atrial size was mildly dilated.   4. Right atrial size was mildly dilated.   5. The mitral valve is normal in structure. No evidence of mitral valve  regurgitation. No evidence of mitral stenosis.   6. The aortic valve is tricuspid. Aortic valve regurgitation is not  visualized. No aortic stenosis is present.   7. The inferior vena cava is dilated in size with >50% respiratory  variability, suggesting right atrial pressure of 8 mmHg.   Findings:      The Z-line was regular and was found 38 cm from the incisors.      No gross lesions were noted in the entire examined stomach.      No gross lesions were noted in the entire examined duodenum. Scope was       withdrawn quickly because of oxygen desaturation. Impression:               - Z-line regular, 38 cm from the incisors.                           - No gross lesions in the entire stomach.                           - No gross lesions in the entire examined duodenum.                           - No specimens collected. Moderate Sedation:      Moderate (conscious) sedation was personally administered by an       anesthesia professional. The following parameters were monitored: oxygen       saturation, heart rate, blood pressure, and response to care. Recommendation:           - Return patient to hospital ward for ongoing care.                           - Resume previous diet.                           - Continue present medications.

## 2022-11-14 NOTE — Progress Notes (Signed)
PHARMACY - HEPARIN  Patient with IV heparin gtt @ 1300 units/hr for atrial flutter.  Heparin level drawn @ 05:33 < 0.1  Spoke with RN who shared IV was infiltrated between 02:30-03:30 this AM so HL obtained @ 05:33 not reflective of true heparin level at current rate.  No bleeding complications noted.  Plan:  Continue IV heparin currently @ 1300 units/hr           Repeat heparin level @ [place], PharmD

## 2022-11-14 NOTE — Consult Note (Signed)
Referring Provider: [facility] Primary Care Physician:  Azzie Glatter, FNP Primary Gastroenterologist:  Althia Forts  Reason for Consultation: Anemia  HPI: Debbie Lee is a 52 y.o. female medical history significant of a flutter not on anticoagulation presents for evaluation of anemia  No previous EGD/colonoscopy Hgb 8.0 (10.1 3 months ago)  Patient reports ongoing fatigue, shortness of breath. States she is concerned about the issue with her heart. Reports ongoing intermittent anemia for many years. Previously had heavy periods, but since nexplanonon placement she has not had any bleeding with cycles. Reports longstanding history of constipation. Reports increased gas, epigastric discomfort, belching, and heartburn. Denies melena/hematochezia   Past Medical History:  Diagnosis Date   Anemia    Arthritis    Depression    Hypertension    Panic attack     Past Surgical History:  Procedure Laterality Date   NO PAST SURGERIES     ORIF ANKLE FRACTURE Right 10/29/2019   Procedure: OPEN REDUCTION INTERNAL FIXATION (ORIF) ANKLE FRACTURE;  Surgeon: Renette Butters, MD;  Location: [facility];  Service: Orthopedics;  Laterality: Right;    Prior to Admission medications   Medication Sig Start Date End Date Taking? Authorizing Provider  DULoxetine (CYMBALTA) 60 MG capsule Take 60 mg by mouth daily. 10/22/20  Yes [provider]  omeprazole (PRILOSEC) 20 MG capsule Take 20 mg by mouth daily as needed (indigestion).   Yes [provider]  propranolol (INDERAL) 40 MG tablet Take 40 mg by mouth 2 (two) times daily. 06/20/19  Yes [provider]  albuterol (VENTOLIN HFA) 108 (90 Base) MCG/ACT inhaler Inhale 2 puffs into the lungs every 6 (six) hours as needed for wheezing or shortness of breath. Patient not taking: Reported on 11/13/2022 06/29/20   [provider]  aspirin EC 81 MG tablet Take 1 tablet (81 mg total) by mouth 2 (two) times daily. For DVT prophylaxis for 30  days after surgery. Patient not taking: Reported on 11/16/2020 10/29/19   Prudencio Burly III, PA-C  diltiazem (CARDIZEM CD) 180 MG 24 hr capsule Take 1 capsule (180 mg total) by mouth daily. Patient not taking: Reported on 11/13/2022 08/15/22   Tedd Sias, PA  docusate sodium (COLACE) 100 MG capsule Take 1 capsule (100 mg total) by mouth 2 (two) times daily. To prevent constipation while taking pain medication. Patient not taking: Reported on 11/16/2020 10/29/19   Prudencio Burly III, PA-C  methocarbamol (ROBAXIN) 500 MG tablet Take 1 tablet (500 mg total) by mouth every 8 (eight) hours as needed for muscle spasms. Patient not taking: Reported on 11/16/2020 10/29/19   Prudencio Burly III, PA-C  metoprolol tartrate (LOPRESSOR) 25 MG tablet Take 1 tablet (25 mg total) by mouth once as needed for up to 1 dose (FOR SEVERE PALPITATIONS WHEN SBP > 100 MMHG). Patient not taking: Reported on 11/13/2022 01/23/21   Varney Biles, MD  norgestimate-ethinyl estradiol (ORTHO-CYCLEN,SPRINTEC,PREVIFEM) 0.25-35 MG-MCG tablet Take 1 tablet by mouth daily. Patient not taking: Reported on 10/28/2019 07/01/15   Rasch, Anderson [place] I, NP  ondansetron (ZOFRAN ODT) 4 MG disintegrating tablet Take 1 tablet (4 mg total) by mouth every 8 (eight) hours as needed for nausea or vomiting. Patient not taking: Reported on 11/16/2020 10/28/19   Suella Broad A, PA-C    Scheduled Meds:  DULoxetine  60 mg Oral Daily   insulin aspart  0-9 Units Subcutaneous Q4H   pantoprazole (PROTONIX) IV  40 mg Intravenous Q12H   propranolol  40 mg  Oral BID   Continuous Infusions:  sodium chloride 100 mL/hr at 11/14/22 0511   diltiazem (CARDIZEM) infusion 12.5 mg/hr (11/14/22 0645)   heparin 1,300 Units/hr (11/14/22 0511)   PRN Meds:.acetaminophen **OR** acetaminophen, HYDROcodone-acetaminophen  Allergies as of 11/13/2022 - Review Complete 11/13/2022  Allergen Reaction Noted   Codeine Other (See Comments) 02/16/2014   Sulfa  antibiotics Other (See Comments) 02/16/2014    Family History  Adopted: Yes    Social History   Socioeconomic History   Marital status: Married    Spouse name: Not on file   Number of children: Not on file   Years of education: Not on file   Highest education level: Not on file  Occupational History   Not on file  Tobacco Use   Smoking status: Former    Years: 22.00    Types: Cigarettes   Smokeless tobacco: Never  Vaping Use   Vaping Use: Never used  Substance and Sexual Activity   Alcohol use: Not Currently    Comment: 4/year   Drug use: No   Sexual activity: Not Currently    Birth control/protection: Implant  Other Topics Concern   Not on file  Social History Narrative   Not on file   Social Determinants of Health   Financial Resource Strain: Not on file  Food Insecurity: Food Insecurity Present (11/14/2022)   Hunger Vital Sign    Worried About Running Out of Food in the Last Year: Sometimes true    Ran Out of Food in the Last Year: Sometimes true  Transportation Needs: Unmet Transportation Needs (11/14/2022)   PRAPARE - Hydrologist (Medical): Yes    Lack of Transportation (Non-Medical): No  Physical Activity: Not on file  Stress: Not on file  Social Connections: Not on file  Intimate Partner Violence: Not At Risk (11/14/2022)   Humiliation, Afraid, Rape, and Kick questionnaire    Fear of Current or Ex-Partner: No    Emotionally Abused: No    Physically Abused: No    Sexually Abused: No    Review of Systems: All negative except as stated above in HPI.  Physical Exam:Physical Exam Constitutional:      General: She is not in acute distress.    Appearance: She is obese. She is not ill-appearing.  HENT:     Head: Normocephalic.     Nose: Congestion present.     Mouth/Throat:     Mouth: Mucous membranes are moist.     Pharynx: Oropharynx is clear.  Eyes:     General: No scleral icterus.    Extraocular Movements: Extraocular  movements intact.  Cardiovascular:     Rate and Rhythm: Normal rate and regular rhythm.  Pulmonary:     Effort: Pulmonary effort is normal. No respiratory distress.  Abdominal:     General: There is distension.     Palpations: Abdomen is soft. There is no mass.     Tenderness: There is no abdominal tenderness. There is no rebound.     Hernia: No hernia is present.  Musculoskeletal:        General: No swelling. Normal range of motion.     Cervical back: Normal range of motion and neck supple.  Skin:    General: Skin is warm and dry.  Neurological:     General: No focal deficit present.     Mental Status: She is alert and oriented to person, place, and time.  Psychiatric:  Mood and Affect: Mood normal.        Behavior: Behavior normal.        Thought Content: Thought content normal.        Judgment: Judgment normal.     Vital signs: Vitals:   11/14/22 0505 11/14/22 0651  BP:    Pulse: 74   Resp: (!) 21   Temp:  98.2 F (36.8 C)  SpO2:     Last BM Date :  (pta)    GI:  Lab Results: Recent Labs    11/14/22 0030 11/14/22 0248 11/14/22 0533  WBC 14.1* 13.0* 12.8*  HGB 8.7* 8.3* 8.0*  HCT 32.8* 30.8* 29.3*  PLT 327 324 295   BMET Recent Labs    11/13/22 1758 11/14/22 0533  NA 138 134*  K 4.1 3.8  CL 103 100  CO2 25 25  GLUCOSE 192* 176*  BUN 15 14  CREATININE 1.01* 0.81  CALCIUM 8.6* 8.0*   LFT Recent Labs    11/14/22 0248 11/14/22 0533  PROT 6.7 6.6  ALBUMIN 3.0* 3.1*  AST 28 33  ALT 29 29  ALKPHOS 69 71  BILITOT 1.3* 1.4*  BILIDIR 0.2  --   IBILI 1.1*  --    PT/INR Recent Labs    11/13/22 2055  LABPROT 17.5*  INR 1.4*     Studies/Results: DG Abd 1 View  Result Date: 11/14/2022 CLINICAL DATA:  History of constipation EXAM: ABDOMEN - 1 VIEW COMPARISON:  None Available. FINDINGS: Bladder is partially distended with opacified urine. Scattered large and small bowel gas is noted. No obstructive changes are seen. No significant  fecal retention to suggest constipation is noted. No free air is seen. IMPRESSION: No acute abnormality noted. Electronically Signed   By: Inez Catalina M.D.   On: 11/14/2022 00:05   CT Angio Chest PE W and/or Wo Contrast  Result Date: 11/13/2022 CLINICAL DATA:  Concern for pulmonary embolism. EXAM: CT ANGIOGRAPHY CHEST WITH CONTRAST TECHNIQUE: Multidetector CT imaging of the chest was performed using the standard protocol during bolus administration of intravenous contrast. Multiplanar CT image reconstructions and MIPs were obtained to evaluate the vascular anatomy. RADIATION DOSE REDUCTION: This exam was performed according to the departmental dose-optimization program which includes automated exposure control, adjustment of the mA and/or kV according to patient size and/or use of iterative reconstruction technique. CONTRAST:  171m OMNIPAQUE IOHEXOL 350 MG/ML SOLN COMPARISON:  Chest radiograph dated 11/13/2022. FINDINGS: Evaluation of this exam is limited due to respiratory motion artifact. Cardiovascular: There is no cardiomegaly or pericardial effusion. The thoracic aorta is unremarkable. Evaluation of the pulmonary arteries is limited due to respiratory motion. No obvious large or central pulmonary artery embolus identified. Mediastinum/Nodes: Right hilar adenopathy measures 13 mm short axis. The esophagus is grossly unremarkable. No mediastinal fluid collection. Lungs/Pleura: There is scattered hazy and ground-glass density throughout the lungs with mosaic appearance, likely related to underlying small airway or small vessel disease. No focal consolidation. There is no pleural effusion or pneumothorax. The central airways are patent. Upper Abdomen: Fatty liver. Musculoskeletal: Mild degenerative changes. No acute osseous pathology. Review of the MIP images confirms the above findings. IMPRESSION: 1. No acute intrathoracic pathology. No obvious large or central pulmonary artery embolus identified. 2. Fatty  liver. 3. Right hilar adenopathy, likely reactive. Electronically Signed   By: AAnner CreteM.D.   On: 11/13/2022 23:22   DG Chest Portable 1 View  Result Date: 11/13/2022 CLINICAL DATA:  Provided history: Shortness of breath. Chest  pain. History of SVT. EXAM: PORTABLE CHEST 1 VIEW COMPARISON:  Prior chest radiographs 08/15/2022 and earlier. FINDINGS: Cardiac monitoring pads overlie the chest. Heart size within normal limits. No appreciable airspace consolidation or pulmonary edema. No evidence of pleural effusion or pneumothorax. No acute osseous abnormality identified. IMPRESSION: No evidence of active cardiopulmonary disease. Electronically Signed   By: Kellie Simmering D.O.   On: 11/13/2022 18:19    Impression: Symptomatic anemia - Hgb 8.0 (10.1 3 months ago, normal one year ago) -BUN 14, creatinine 0.81 -Abdominal x-ray negative  A-flutter  Plan: Plan for EGD tomorrow. I thoroughly discussed the procedures to include nature, alternatives, benefits, and risks including but not limited to bleeding, perforation, infection, anesthesia/cardiac and pulmonary complications. Patient provides understanding and gave verbal consent to proceed. Continue Protonix IV 60m BID Continue clear liquid diet NPO at midnight [facility] will follow.       LOS: 0 days   BGarnette Scheuermann PA-C 11/14/2022, 8:46 AM  Contact #  [phone]

## 2022-11-14 NOTE — Consult Note (Addendum)
Cardiology Consultation   Patient ID: Debbie Lee MRN: [id number]; DOB: [birth year]  Admit date: 11/13/2022 Date of Consult: 11/14/2022  PCP:  Azzie Glatter, [place] Providers Cardiologist:  New to [facility] - Dr. Johnsie Cancel   Patient Profile:   Debbie Lee is a 52 y.o. female with a hx of SVT, hypertension and newly diagnosed DM2 who is being seen 11/14/2022 for the evaluation of atrial flutter at the request of Dr. Verlon Au.  History of Present Illness:   Ms. Debbie Lee is a morbidly obese pleasant 52 year old female with past medical history of SVT, hypertension and newly diagnosed DM2.  Patient was seen twice in ED in 2022 and was diagnosed with SVT.  One of which episode was converted on adenosine.  Patient returned to the ED on 08/15/2022 with shortness of breath and was diagnosed with atrial flutter after heart rate was slowed down by adenosine.  The case was discussed with on-call cardiologist Dr. Terrence Dupont who recommended discharge patient oral Cardizem and follow-up as outpatient.  Patient was ultimately discharged on 180 mg of Cardizem CD on top of her previous propranolol 40 mg twice a day dosing.  Unfortunately, patient was unable to keep her outpatient cardiology visit due to high co-pay.  She ran out of oral Cardizem after a month.  In the past few days, she has been having worsening shortness of breath with exertion.  Over the past few weeks, she also described episodes of "panic attack" in the middle of the night where she could not lay flat and woke up feeling short of breath.    She eventually sought medical attention at [facility] long ED.  On arrival, she was in 2-1 atrial flutter.  Vagal maneuver was attempted but was unsuccessful.  She was placed on IV Cardizem on top of propranolol.  Heart rate came down to the 80s.  Hemoglobin was 8.8 on arrival.  Stool Hemoccult was negative.  TSH elevated to 6.845, free T4 normal.  Hemoglobin A1c was 9.3 which is new and  place the patient in the diabetes range.  Urinalysis showed small hemoglobin, rare bacteria.  Cardiology service consulted for atrial flutter with RVR.  Around 8:28 AM this morning, it appears patient may have converted from atrial flutter to atrial fibrillation.  She is also on IV heparin at this point.  Pregnancy test negative.  Viral panel also negative.   Past Medical History:  Diagnosis Date   Anemia    Arthritis    Depression    Hypertension    Panic attack     Past Surgical History:  Procedure Laterality Date   NO PAST SURGERIES     ORIF ANKLE FRACTURE Right 10/29/2019   Procedure: OPEN REDUCTION INTERNAL FIXATION (ORIF) ANKLE FRACTURE;  Surgeon: Renette Butters, MD;  Location: [facility];  Service: Orthopedics;  Laterality: Right;     Home Medications:  Prior to Admission medications   Medication Sig Start Date End Date Taking? Authorizing Provider  DULoxetine (CYMBALTA) 60 MG capsule Take 60 mg by mouth daily. 10/22/20  Yes [provider]  omeprazole (PRILOSEC) 20 MG capsule Take 20 mg by mouth daily as needed (indigestion).   Yes [provider]  propranolol (INDERAL) 40 MG tablet Take 40 mg by mouth 2 (two) times daily. 06/20/19  Yes [provider]  albuterol (VENTOLIN HFA) 108 (90 Base) MCG/ACT inhaler Inhale 2 puffs into the lungs every 6 (six) hours as needed for wheezing or shortness  of breath. Patient not taking: Reported on 11/13/2022 06/29/20   [provider]  aspirin EC 81 MG tablet Take 1 tablet (81 mg total) by mouth 2 (two) times daily. For DVT prophylaxis for 30 days after surgery. Patient not taking: Reported on 11/16/2020 10/29/19   Prudencio Burly III, PA-C  diltiazem (CARDIZEM CD) 180 MG 24 hr capsule Take 1 capsule (180 mg total) by mouth daily. Patient not taking: Reported on 11/13/2022 08/15/22   Tedd Sias, PA  docusate sodium (COLACE) 100 MG capsule Take 1 capsule (100 mg total) by mouth 2 (two) times daily. To  prevent constipation while taking pain medication. Patient not taking: Reported on 11/16/2020 10/29/19   Prudencio Burly III, PA-C  methocarbamol (ROBAXIN) 500 MG tablet Take 1 tablet (500 mg total) by mouth every 8 (eight) hours as needed for muscle spasms. Patient not taking: Reported on 11/16/2020 10/29/19   Prudencio Burly III, PA-C  metoprolol tartrate (LOPRESSOR) 25 MG tablet Take 1 tablet (25 mg total) by mouth once as needed for up to 1 dose (FOR SEVERE PALPITATIONS WHEN SBP > 100 MMHG). Patient not taking: Reported on 11/13/2022 01/23/21   Varney Biles, MD  norgestimate-ethinyl estradiol (ORTHO-CYCLEN,SPRINTEC,PREVIFEM) 0.25-35 MG-MCG tablet Take 1 tablet by mouth daily. Patient not taking: Reported on 10/28/2019 07/01/15   Rasch, Anderson Malta I, NP  ondansetron (ZOFRAN ODT) 4 MG disintegrating tablet Take 1 tablet (4 mg total) by mouth every 8 (eight) hours as needed for nausea or vomiting. Patient not taking: Reported on 11/16/2020 10/28/19   Tacy Learn, PA-C    Inpatient Medications: Scheduled Meds:  DULoxetine  60 mg Oral Daily   insulin aspart  0-9 Units Subcutaneous Q4H   pantoprazole (PROTONIX) IV  40 mg Intravenous Q12H   propranolol  40 mg Oral BID   Continuous Infusions:  sodium chloride 100 mL/hr at 11/14/22 0511   diltiazem (CARDIZEM) infusion 12.5 mg/hr (11/14/22 0645)   heparin 1,300 Units/hr (11/14/22 0511)   PRN Meds: acetaminophen **OR** acetaminophen, HYDROcodone-acetaminophen  Allergies:    Allergies  Allergen Reactions   Codeine Other (See Comments)    "Hallucinations. Narcotics cause vomiting."   Sulfa Antibiotics Other (See Comments)    "lifelong allergy"    Social History:   Social History   Socioeconomic History   Marital status: Married    Spouse name: Not on file   Number of children: Not on file   Years of education: Not on file   Highest education level: Not on file  Occupational History   Not on file  Tobacco Use   Smoking  status: Former    Years: 22.00    Types: Cigarettes   Smokeless tobacco: Never  Vaping Use   Vaping Use: Never used  Substance and Sexual Activity   Alcohol use: Not Currently    Comment: 4/year   Drug use: No   Sexual activity: Not Currently    Birth control/protection: Implant  Other Topics Concern   Not on file  Social History Narrative   Not on file   Social Determinants of Health   Financial Resource Strain: Not on file  Food Insecurity: Food Insecurity Present (11/14/2022)   Hunger Vital Sign    Worried About Running Out of Food in the Last Year: Sometimes true    Ran Out of Food in the Last Year: Sometimes true  Transportation Needs: Unmet Transportation Needs (11/14/2022)   PRAPARE - Hydrologist (Medical): Yes  Lack of Transportation (Non-Medical): No  Physical Activity: Not on file  Stress: Not on file  Social Connections: Not on file  Intimate Partner Violence: Not At Risk (11/14/2022)   Humiliation, Afraid, Rape, and Kick questionnaire    Fear of Current or Ex-Partner: No    Emotionally Abused: No    Physically Abused: No    Sexually Abused: No    Family History:    Family History  Adopted: Yes     ROS:  Please see the history of present illness.   All other ROS reviewed and negative.     Physical Exam/Data:   Vitals:   11/14/22 0500 11/14/22 0505 11/14/22 0651 11/14/22 0904  BP:    110/76  Pulse: (!) 109 74  86  Resp: 20 (!) 21    Temp:   98.2 F (36.8 C)   TempSrc:   Oral   SpO2: 98%     Weight:      Height:        Intake/Output Summary (Last 24 hours) at 11/14/2022 0937 Last data filed at 11/14/2022 0829 Gross per 24 hour  Intake 369.31 ml  Output 150 ml  Net 219.31 ml      11/13/2022    5:21 PM 05/23/2022    8:10 AM 01/23/2021    3:03 PM  Last 3 Weights  Weight (lbs) 310 lb 310 lb 285 lb  Weight (kg) 140.615 kg 140.615 kg 129.275 kg     Body mass index is 56.7 kg/m.  General:  Well nourished, well  developed, in no acute distress HEENT: normal Neck: no JVD Vascular: No carotid bruits; Distal pulses 2+ bilaterally Cardiac: Irregular; no murmur  Lungs:  clear to auscultation bilaterally, no wheezing, rhonchi or rales  Abd: soft, nontender, no hepatomegaly  Ext: no edema Musculoskeletal:  No deformities, BUE and BLE strength normal and equal Skin: warm and dry  Neuro:  CNs 2-12 intact, no focal abnormalities noted Psych:  Normal affect   EKG:  The EKG was personally reviewed and demonstrates:   Telemetry:  Telemetry was personally reviewed and demonstrates: Croitoru atrial flutter, initial heart rate in the ED was 150s, heart rate slowed down to the 80s overnight, around 8 AM this morning, it appears patient has converted to atrial fibrillation  Relevant CV Studies:  Pending echo  Laboratory Data:  High Sensitivity Troponin:   Recent Labs  Lab 11/13/22 1758 11/13/22 2035  TROPONINIHS 4 5     Chemistry Recent Labs  Lab 11/13/22 1758 11/14/22 0533  NA 138 134*  K 4.1 3.8  CL 103 100  CO2 25 25  GLUCOSE 192* 176*  BUN 15 14  CREATININE 1.01* 0.81  CALCIUM 8.6* 8.0*  MG 1.9 1.9  GFRNONAA >60 >60  ANIONGAP 10 9    Recent Labs  Lab 11/14/22 0248 11/14/22 0533  PROT 6.7 6.6  ALBUMIN 3.0* 3.1*  AST 28 33  ALT 29 29  ALKPHOS 69 71  BILITOT 1.3* 1.4*   Lipids No results for input(s): "CHOL", "TRIG", "HDL", "LABVLDL", "LDLCALC", "CHOLHDL" in the last 168 hours.  Hematology Recent Labs  Lab 11/14/22 0030 11/14/22 0248 11/14/22 0533  WBC 14.1* 13.0* 12.8*  RBC 4.33 4.08  4.03 3.90  HGB 8.7* 8.3* 8.0*  HCT 32.8* 30.8* 29.3*  MCV 75.8* 75.5* 75.1*  MCH 20.1* 20.3* 20.5*  MCHC 26.5* 26.9* 27.3*  RDW 19.2* 19.1* 19.2*  PLT 327 324 295   Thyroid  Recent Labs  Lab 11/13/22 1758  TSH 6.845*  FREET4 0.89    BNP Recent Labs  Lab 11/13/22 1758  BNP 228.6*    DDimer  Recent Labs  Lab 11/13/22 1829  DDIMER 1.21*     Radiology/Studies:  DG Abd  1 View  Result Date: 11/14/2022 CLINICAL DATA:  History of constipation EXAM: ABDOMEN - 1 VIEW COMPARISON:  None Available. FINDINGS: Bladder is partially distended with opacified urine. Scattered large and small bowel gas is noted. No obstructive changes are seen. No significant fecal retention to suggest constipation is noted. No free air is seen. IMPRESSION: No acute abnormality noted. Electronically Signed   By: Inez Catalina M.D.   On: 11/14/2022 00:05   CT Angio Chest PE W and/or Wo Contrast  Result Date: 11/13/2022 CLINICAL DATA:  Concern for pulmonary embolism. EXAM: CT ANGIOGRAPHY CHEST WITH CONTRAST TECHNIQUE: Multidetector CT imaging of the chest was performed using the standard protocol during bolus administration of intravenous contrast. Multiplanar CT image reconstructions and MIPs were obtained to evaluate the vascular anatomy. RADIATION DOSE REDUCTION: This exam was performed according to the departmental dose-optimization program which includes automated exposure control, adjustment of the mA and/or kV according to patient size and/or use of iterative reconstruction technique. CONTRAST:  187m OMNIPAQUE IOHEXOL 350 MG/ML SOLN COMPARISON:  Chest radiograph dated 11/13/2022. FINDINGS: Evaluation of this exam is limited due to respiratory motion artifact. Cardiovascular: There is no cardiomegaly or pericardial effusion. The thoracic aorta is unremarkable. Evaluation of the pulmonary arteries is limited due to respiratory motion. No obvious large or central pulmonary artery embolus identified. Mediastinum/Nodes: Right hilar adenopathy measures 13 mm short axis. The esophagus is grossly unremarkable. No mediastinal fluid collection. Lungs/Pleura: There is scattered hazy and ground-glass density throughout the lungs with mosaic appearance, likely related to underlying small airway or small vessel disease. No focal consolidation. There is no pleural effusion or pneumothorax. The central airways are  patent. Upper Abdomen: Fatty liver. Musculoskeletal: Mild degenerative changes. No acute osseous pathology. Review of the MIP images confirms the above findings. IMPRESSION: 1. No acute intrathoracic pathology. No obvious large or central pulmonary artery embolus identified. 2. Fatty liver. 3. Right hilar adenopathy, likely reactive. Electronically Signed   By: AAnner CreteM.D.   On: 11/13/2022 23:22   DG Chest Portable 1 View  Result Date: 11/13/2022 CLINICAL DATA:  Provided history: Shortness of breath. Chest pain. History of SVT. EXAM: PORTABLE CHEST 1 VIEW COMPARISON:  Prior chest radiographs 08/15/2022 and earlier. FINDINGS: Cardiac monitoring pads overlie the chest. Heart size within normal limits. No appreciable airspace consolidation or pulmonary edema. No evidence of pleural effusion or pneumothorax. No acute osseous abnormality identified. IMPRESSION: No evidence of active cardiopulmonary disease. Electronically Signed   By: KKellie SimmeringD.O.   On: 11/13/2022 18:19     Assessment and Plan:   Atrial flutter with RVR: Patient likely has been in persistent atrial flutter since November.  She was discharged on oral Cardizem on top of her previous propranolol and was told to follow-up with Dr. HTerrence Dupontas outpatient.  ED physician discussed the case with Dr. HTerrence Dupontat the time.  However due to high co-pay, patient never ended up seeing Dr. HTerrence Dupont  She eventually ran out of oral Cardizem prescription and came in with atrial flutter with RVR.  She has been having episodes of "panic attack" that sounds like orthopnea and PND.  Will obtain echocardiogram.  Heart rate improved after starting on IV Cardizem.  MD to review telemetry, it appears patient may have  converted from atrial flutter to A-fib shortly after 8 AM this morning.  Dr. Johnsie Cancel saw the patient and recommended started on Eliquis and consider inpatient TEE DCCV this week.  Risk and the benefit of the procedure has been discussed with Dr.  Admission, patient was agreeable to proceed.  Hypertension: On propranolol and IV Cardizem.  History of SVT: Occurred in 2022 and converted adenosine at the time.  Newly diagnosed diabetes mellitus type 2: Managed by primary team  Anemia: GI has been consulted.   Risk Assessment/Risk Scores:        New York Heart Association (NYHA) Functional Class NYHA Class III  CHA2DS2-VASc Score = 3   This indicates a 3.2% annual risk of stroke. The patient's score is based upon: CHF History: 0 HTN History: 1 Diabetes History: 1 Stroke History: 0 Vascular Disease History: 0 Age Score: 0 Gender Score: 1         For questions or updates, please contact [place] Please consult [email or web address] for contact info under    Hilbert Corrigan, [place]  11/14/2022 9:37 AM

## 2022-11-15 ENCOUNTER — Encounter (HOSPITAL_COMMUNITY): Payer: Self-pay | Admitting: Internal Medicine

## 2022-11-15 ENCOUNTER — Inpatient Hospital Stay (HOSPITAL_COMMUNITY): Payer: BLUE CROSS/BLUE SHIELD | Admitting: Anesthesiology

## 2022-11-15 ENCOUNTER — Encounter (HOSPITAL_COMMUNITY)
Admission: EM | Disposition: A | Discharge status: Home / Self Care | Payer: Self-pay | Source: Home / Self Care | Attending: Internal Medicine

## 2022-11-15 DIAGNOSIS — I483 Typical atrial flutter: Secondary | ICD-10-CM | POA: Diagnosis not present

## 2022-11-15 DIAGNOSIS — I4892 Unspecified atrial flutter: Secondary | ICD-10-CM | POA: Diagnosis not present

## 2022-11-15 DIAGNOSIS — I420 Dilated cardiomyopathy: Secondary | ICD-10-CM | POA: Diagnosis not present

## 2022-11-15 HISTORY — PX: ESOPHAGOGASTRODUODENOSCOPY: SHX5428

## 2022-11-15 LAB — CBC
HCT: 29.2 % — ABNORMAL LOW (ref 36.0–46.0)
Hemoglobin: 7.9 g/dL — ABNORMAL LOW (ref 12.0–15.0)
MCH: 20.5 pg — ABNORMAL LOW (ref 26.0–34.0)
MCHC: 27.1 g/dL — ABNORMAL LOW (ref 30.0–36.0)
MCV: 75.6 fL — ABNORMAL LOW (ref 80.0–100.0)
Platelets: 292 10*3/uL (ref 150–400)
RBC: 3.86 MIL/uL — ABNORMAL LOW (ref 3.87–5.11)
RDW: 19.3 % — ABNORMAL HIGH (ref 11.5–15.5)
WBC: 12.1 10*3/uL — ABNORMAL HIGH (ref 4.0–10.5)
nRBC: 0.2 % (ref 0.0–0.2)

## 2022-11-15 LAB — GLUCOSE, CAPILLARY
Glucose-Capillary: 146 mg/dL — ABNORMAL HIGH (ref 70–99)
Glucose-Capillary: 172 mg/dL — ABNORMAL HIGH (ref 70–99)
Glucose-Capillary: 154 mg/dL — ABNORMAL HIGH (ref 70–99)
Glucose-Capillary: 176 mg/dL — ABNORMAL HIGH (ref 70–99)
Glucose-Capillary: 191 mg/dL — ABNORMAL HIGH (ref 70–99)
Glucose-Capillary: 209 mg/dL — ABNORMAL HIGH (ref 70–99)

## 2022-11-15 LAB — HEPARIN LEVEL (UNFRACTIONATED): Heparin Unfractionated: 0.11 IU/mL — ABNORMAL LOW (ref 0.30–0.70)

## 2022-11-15 SURGERY — EGD (ESOPHAGOGASTRODUODENOSCOPY)
Anesthesia: Monitor Anesthesia Care

## 2022-11-15 MED ORDER — FUROSEMIDE 10 MG/ML IJ SOLN
20.0000 mg | Freq: Every day | INTRAMUSCULAR | Status: DC
  Administered 2022-11-15 – 2022-11-19 (×5): 20 mg via INTRAVENOUS
  Filled 2022-11-15 (×5): qty 2

## 2022-11-15 MED ORDER — LACTATED RINGERS IV SOLN
INTRAVENOUS | Status: AC | PRN
  Administered 2022-11-15: 1000 mL via INTRAVENOUS

## 2022-11-15 MED ORDER — PROPOFOL 10 MG/ML IV BOLUS
INTRAVENOUS | Status: DC | PRN
  Administered 2022-11-15 (×2): 10 mg via INTRAVENOUS

## 2022-11-15 MED ORDER — PROPOFOL 10 MG/ML IV BOLUS
INTRAVENOUS | Status: AC
  Filled 2022-11-15: qty 20

## 2022-11-15 MED ORDER — METFORMIN HCL 500 MG PO TABS
500.0000 mg | ORAL_TABLET | Freq: Two times a day (BID) | ORAL | Status: DC
  Administered 2022-11-17 – 2022-11-20 (×7): 500 mg via ORAL
  Filled 2022-11-15 (×7): qty 1

## 2022-11-15 MED ORDER — PNEUMOCOCCAL 20-VAL CONJ VACC 0.5 ML IM SUSY
0.5000 mL | PREFILLED_SYRINGE | INTRAMUSCULAR | Status: DC
  Filled 2022-11-15: qty 0.5

## 2022-11-15 MED ORDER — CARVEDILOL 6.25 MG PO TABS
6.2500 mg | ORAL_TABLET | Freq: Two times a day (BID) | ORAL | Status: DC
  Administered 2022-11-15 – 2022-11-18 (×7): 6.25 mg via ORAL
  Filled 2022-11-15 (×7): qty 1

## 2022-11-15 MED ORDER — CARVEDILOL 6.25 MG PO TABS: 6.2500 mg | ORAL_TABLET | Freq: Two times a day (BID) | ORAL | Status: DC

## 2022-11-15 MED ORDER — SACUBITRIL-VALSARTAN 24-26 MG PO TABS
1.0000 | ORAL_TABLET | Freq: Two times a day (BID) | ORAL | Status: DC
  Administered 2022-11-15 – 2022-11-20 (×11): 1 via ORAL
  Filled 2022-11-15 (×12): qty 1

## 2022-11-15 MED ORDER — FUROSEMIDE 10 MG/ML IJ SOLN
40.0000 mg | Freq: Once | INTRAMUSCULAR | Status: AC
  Administered 2022-11-15: 40 mg via INTRAVENOUS
  Filled 2022-11-15: qty 4

## 2022-11-15 MED ORDER — HEPARIN (PORCINE) 25000 UT/250ML-% IV SOLN
1800.0000 [IU]/h | INTRAVENOUS | Status: DC
  Administered 2022-11-15: 1800 [IU]/h via INTRAVENOUS
  Filled 2022-11-15: qty 250

## 2022-11-15 MED ORDER — PROPOFOL 1000 MG/100ML IV EMUL
INTRAVENOUS | Status: AC
  Filled 2022-11-15: qty 100

## 2022-11-15 MED ORDER — LACTATED RINGERS IV SOLN: INTRAVENOUS | Status: DC | PRN

## 2022-11-15 MED ORDER — APIXABAN 5 MG PO TABS
5.0000 mg | ORAL_TABLET | Freq: Two times a day (BID) | ORAL | Status: DC
  Administered 2022-11-15 – 2022-11-20 (×10): 5 mg via ORAL
  Filled 2022-11-15 (×10): qty 1

## 2022-11-15 MED ORDER — DILTIAZEM HCL-DEXTROSE 125-5 MG/125ML-% IV SOLN (PREMIX)
5.0000 mg/h | INTRAVENOUS | Status: DC
  Administered 2022-11-15: 5 mg/h via INTRAVENOUS
  Administered 2022-11-16: 10 mg/h via INTRAVENOUS
  Administered 2022-11-16 – 2022-11-18 (×5): 15 mg/h via INTRAVENOUS
  Filled 2022-11-15 (×9): qty 125

## 2022-11-15 MED ORDER — PROPOFOL 500 MG/50ML IV EMUL
INTRAVENOUS | Status: DC | PRN
  Administered 2022-11-15: 135 ug/kg/min via INTRAVENOUS

## 2022-11-15 MED ORDER — DILTIAZEM HCL 60 MG PO TABS
60.0000 mg | ORAL_TABLET | Freq: Three times a day (TID) | ORAL | Status: DC
  Administered 2022-11-15: 60 mg via ORAL
  Filled 2022-11-15: qty 1

## 2022-11-15 NOTE — Op Note (Signed)
[facility] Patient Name: Debbie Lee Procedure Date: 11/15/2022 MRN: [id number] Attending MD: Otis Brace , MD, [id number] Date of Birth: 05-Jan-1971 CSN: [id number] Age: 52 Admit Type: Inpatient Procedure:                Upper GI endoscopy Indications:              Iron deficiency anemia, Heme positive stool Providers:                Otis Brace, MD, Jobe Igo, RN, Cherylynn Ridges, Technician, Maudry Diego, CRNA, Fanny Skates RN, RN Referring MD:              Medicines:                Sedation Administered by an Anesthesia Professional Complications:            No immediate complications. Estimated Blood Loss:     Estimated blood loss was minimal. Procedure:                Pre-Anesthesia Assessment:                           - Prior to the procedure, a History and Physical                            was performed, and patient medications and                            allergies were reviewed. The patient's tolerance of                            previous anesthesia was also reviewed. The risks                            and benefits of the procedure and the sedation                            options and risks were discussed with the patient.                            All questions were answered, and informed consent                            was obtained. Prior Anticoagulants: The patient has                            taken heparin, last dose was day of procedure. ASA                            Grade Assessment: III - A patient with severe  systemic disease. After reviewing the risks and                            benefits, the patient was deemed in satisfactory                            condition to undergo the procedure.                           After obtaining informed consent, the endoscope was                            passed under direct vision. Throughout the                             procedure, the patient's blood pressure, pulse, and                            oxygen saturations were monitored continuously. The                            [id number] ) Olympus Endoscope was                            introduced through the mouth, and advanced to the                            second part of duodenum. The upper GI endoscopy was                            performed with moderate difficulty due to the                            patient's body habitus and the patient's oxygen                            desaturation. Successful completion of the                            procedure was aided by receiving assistance from                            additional staff. The patient tolerated the                            procedure well. Scope In: Scope Out: Findings:      The Z-line was regular and was found 38 cm from the incisors.      No gross lesions were noted in the entire examined stomach.      No gross lesions were noted in the entire examined duodenum. Scope was       withdrawn quickly because of oxygen desaturation. Impression:               - Z-line regular, 38 cm from the incisors.                           -  No gross lesions in the entire stomach.                           - No gross lesions in the entire examined duodenum.                           - No specimens collected. Moderate Sedation:      Moderate (conscious) sedation was personally administered by an       anesthesia professional. The following parameters were monitored: oxygen       saturation, heart rate, blood pressure, and response to care. Recommendation:           - Return patient to hospital ward for ongoing care.                           - Resume previous diet.                           - Continue present medications. Procedure Code(s):        --- Professional ---                           [phone], Esophagogastroduodenoscopy, flexible,                             transoral; diagnostic, including collection of                            specimen(s) by brushing or washing, when performed                            (separate procedure) Diagnosis Code(s):        --- Professional ---                           D50.9, Iron deficiency anemia, unspecified                           R19.5, Other fecal abnormalities CPT copyright 2022 American Medical Association. All rights reserved. The codes documented in this report are preliminary and upon coder review may  be revised to meet current compliance requirements. Otis Brace, MD Otis Brace, MD 11/15/2022 1:03:09 PM Number of Addenda: 0

## 2022-11-15 NOTE — Progress Notes (Addendum)
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 propranolol TSH 6.8 Free T4 normal A1c 9.3  GI consulted because of low heme globin  2/20 EGD performed-patient became profoundly hypoxic and was difficult to arouse after given propofol therefore no plans currently for colonoscopy  Hospital-Problem based course  SVT/a flutter 2-1 - Defer to cardiology - Current meds Coreg 6.25 twice daily, adding Cardizem 60 every 8 as rate not completely controlled this afternoon defer to cardiology further management - Per GI Dr. Doylene Bode AC--d/w Dr. 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 - Weight gain and fluid weight seem to have concentrated in the abdomen over the past several months - Lasix IV x 1 and then Lasix 20 daily, -1.6 L weight inaccurate  Microcytic anemia - EGD done but done under duress as patient became significantly hypoxic - No plans for colonoscopy per GI as not safe per anesthesiology  Very likely OSA - Auto titration overnight to see if she tolerates - Will asked that CPap coordinate this in the outpatient setting  Mild leukocytosis etiology unclear - Does have  some MASD -Will add nystatin  New onset DM TY 2 A1c 9.3 - Diabetic coordinator input integral, CBGs are 160-200 - CBGs = 1 50s-180s - now metformin 500 bid  Depression - Continue Cymbalta DR 60 daily  DVT prophylaxis:  Code Status:  Family Communication:  Disposition:  Status is: Observation The patient will require care spanning > 2 midnights and should be moved to inpatient because:    Subjective:  Pleasant coherent seen in PACU initially postprocedure quite sleepy but then when reviewed on floor at around 3 PM patient was more awake coherent alert asking to have a diet I have ordered a regular diet CRNA that saw the patient downstairs feels that patient is pretty high risk for any further set sedation I will CC Dr. Johnsie Cancel with regards to this  Objective: Vitals:   11/15/22 1330 11/15/22 1332 11/15/22 1337 11/15/22 1357  BP: (!) 119/57  129/80 (!) 145/83  Pulse: 98 74 98 99  Resp: (!) 29 18 (!) 26 (!) 22  Temp:      TempSrc:      SpO2: 99% 97% 98% 99%  Weight:      Height:        Intake/Output Summary (Last 24 hours) at 11/15/2022 1600 Last data filed at 11/15/2022 1417 Gross per 24 hour  Intake 1299.27 ml  Output 2900 ml  Net -1600.73 ml    Filed Weights   11/13/22 1721 11/15/22 0313 11/15/22 1203  Weight: (!) 140.6 kg (!) 155.2 kg (!) 155.2 kg    Examination:  Awake coherent no distress Thick neck Mallampati 4 no icterus  no pallor S1-S2 41 flutter on monitor Chest is clear posterolaterally no wheeze rales rhonchi Multiple tattoos Does have abdominal pitting edema and upper thigh edema  Data Reviewed: personally reviewed   CBC    Component Value Date/Time   WBC 12.1 (H) 11/15/2022 0014   RBC 3.86 (L) 11/15/2022 0014   HGB 7.9 (L) 11/15/2022 0014   HCT 29.2 (L) 11/15/2022 0014   PLT 292 11/15/2022 0014   MCV 75.6 (L) 11/15/2022 0014   MCH 20.5 (L) 11/15/2022 0014   MCHC 27.1 (L) 11/15/2022 0014   RDW 19.3 (H) 11/15/2022 0014   LYMPHSABS 2.0  11/13/2022 1758   MONOABS 0.5 11/13/2022 1758   EOSABS 0.2 11/13/2022 1758   BASOSABS 0.0 11/13/2022 1758      Latest Ref Rng & Units 11/14/2022    5:33 AM 11/14/2022    2:48 AM 11/13/2022    5:58 PM  CMP  Glucose 70 - 99 mg/dL 176   192   BUN 6 - 20 mg/dL 14   15   Creatinine 0.44 - 1.00 mg/dL 0.81   1.01   Sodium 135 - 145 mmol/L 134   138   Potassium 3.5 - 5.1 mmol/L 3.8   4.1   Chloride 98 - 111 mmol/L 100   103   CO2 22 - 32 mmol/L 25   25   Calcium 8.9 - 10.3 mg/dL 8.0   8.6   Total Protein 6.5 - 8.1 g/dL 6.6  6.7    Total Bilirubin 0.3 - 1.2 mg/dL 1.4  1.3    Alkaline Phos 38 - 126 U/L 71  69    AST 15 - 41 U/L 33  28    ALT 0 - 44 U/L 29  29       Radiology Studies: VAS [place] LOWER EXTREMITY VENOUS (DVT)  Result Date: 11/14/2022  Lower Venous DVT Study Patient Name:  Debbie Lee  Date of Exam:   11/14/2022 Medical Rec #: [id number]    Accession #:    [id number] Date of Birth: 12-21-1970    Patient Gender: F Patient Age:   64 years Exam Location:  [facility] Procedure:      VAS [place] LOWER EXTREMITY VENOUS (DVT) Referring Phys: Nyoka Lint DOUTOVA --------------------------------------------------------------------------------  Indications: D-dimer 1.2.  Anticoagulation: Heparin. Limitations: Body habitus. Comparison Study: No prior studies. Performing Technologist: Darlin Coco RDMS, RVT  Examination Guidelines: A complete evaluation includes B-mode imaging, spectral Doppler, color Doppler, and power Doppler as needed of all accessible portions of each vessel. Bilateral testing is considered an integral part of a complete examination. Limited examinations for reoccurring indications may be performed as noted. The reflux portion of the exam is performed with the patient in reverse Trendelenburg.  +---------+---------------+---------+-----------+----------+-------------------+ RIGHT    CompressibilityPhasicitySpontaneityPropertiesThrombus Aging       +---------+---------------+---------+-----------+----------+-------------------+ CFV      Full           Yes      Yes                                      +---------+---------------+---------+-----------+----------+-------------------+ SFJ      Full                                                             +---------+---------------+---------+-----------+----------+-------------------+  FV Prox  Full                                                             +---------+---------------+---------+-----------+----------+-------------------+ FV Mid   Full                                                             +---------+---------------+---------+-----------+----------+-------------------+ FV DistalFull                                                             +---------+---------------+---------+-----------+----------+-------------------+ PFV      Full                                                             +---------+---------------+---------+-----------+----------+-------------------+ POP      Full           Yes      Yes                                      +---------+---------------+---------+-----------+----------+-------------------+ PTV      Full                                                             +---------+---------------+---------+-----------+----------+-------------------+ PERO                                                  Not well visualized +---------+---------------+---------+-----------+----------+-------------------+   +---------+---------------+---------+-----------+----------+--------------+ LEFT     CompressibilityPhasicitySpontaneityPropertiesThrombus Aging +---------+---------------+---------+-----------+----------+--------------+ CFV      Full           Yes      Yes                                 +---------+---------------+---------+-----------+----------+--------------+ SFJ      Full                                                         +---------+---------------+---------+-----------+----------+--------------+ FV Prox  Full                                                        +---------+---------------+---------+-----------+----------+--------------+  FV Mid   Full                                                        +---------+---------------+---------+-----------+----------+--------------+ FV DistalFull                                                        +---------+---------------+---------+-----------+----------+--------------+ PFV      Full                                                        +---------+---------------+---------+-----------+----------+--------------+ POP      Full           Yes      Yes                                 +---------+---------------+---------+-----------+----------+--------------+ PTV      Full                                                        +---------+---------------+---------+-----------+----------+--------------+ PERO     Full                                                        +---------+---------------+---------+-----------+----------+--------------+     Summary: RIGHT: - There is no evidence of deep vein thrombosis in the lower extremity. However, portions of this examination were limited- see technologist comments above.  - No cystic structure found in the popliteal fossa.  LEFT: - There is no evidence of deep vein thrombosis in the lower extremity.  - No cystic structure found in the popliteal fossa.  *See table(s) above for measurements and observations. Electronically signed by Servando Snare MD on 11/14/2022 at 4:27:56 PM.    Final    ECHOCARDIOGRAM COMPLETE  Result Date: 11/14/2022    ECHOCARDIOGRAM REPORT   Patient Name:   Debbie Lee Date of Exam: 11/14/2022 Medical Rec #:  [id number]   Height:       62.0 in Accession #:    [id number]  Weight:       310.0 lb Date of Birth:  May 14, 1971   BSA:           2.303 m Patient Age:    24 years    BP:           122/72 mmHg Patient Gender: F           HR:           74 bpm. Exam Location:  Inpatient Procedure: 2D Echo, Color Doppler and Cardiac Doppler Indications:    I48.92* Unspecified atrial flutter  History:        Patient has no prior history of Echocardiogram examinations.                 Risk Factors:Hypertension.  Sonographer:    Phineas Douglas Referring Phys: [id number] [place]  1. Left ventricular ejection fraction, by estimation, is 30 to 35%. The left ventricle has moderately decreased function. The left ventricle demonstrates global hypokinesis. Left ventricular diastolic function could not be evaluated.  2. Right ventricular systolic function is moderately reduced. The right ventricular size is normal. There is normal pulmonary artery systolic pressure.  3. Left atrial size was mildly dilated.  4. Right atrial size was mildly dilated.  5. The mitral valve is normal in structure. No evidence of mitral valve regurgitation. No evidence of mitral stenosis.  6. The aortic valve is tricuspid. Aortic valve regurgitation is not visualized. No aortic stenosis is present.  7. The inferior vena cava is dilated in size with >50% respiratory variability, suggesting right atrial pressure of 8 mmHg. FINDINGS  Left Ventricle: Left ventricular ejection fraction, by estimation, is 30 to 35%. The left ventricle has moderately decreased function. The left ventricle demonstrates global hypokinesis. The left ventricular internal cavity size was normal in size. There is no left ventricular hypertrophy. Left ventricular diastolic function could not be evaluated due to atrial fibrillation. Left ventricular diastolic function could not be evaluated. Right Ventricle: The right ventricular size is normal. No increase in right ventricular wall thickness. Right ventricular systolic function is moderately reduced. There is normal pulmonary artery systolic pressure.  The tricuspid regurgitant velocity is 2.08 m/s, and with an assumed right atrial pressure of 15 mmHg, the estimated right ventricular systolic pressure is [id number] mmHg. Left Atrium: Left atrial size was mildly dilated. Right Atrium: Right atrial size was mildly dilated. Pericardium: There is no evidence of pericardial effusion. Mitral Valve: The mitral valve is normal in structure. No evidence of mitral valve regurgitation. No evidence of mitral valve stenosis. Tricuspid Valve: The tricuspid valve is normal in structure. Tricuspid valve regurgitation is mild . No evidence of tricuspid stenosis. Aortic Valve: The aortic valve is tricuspid. Aortic valve regurgitation is not visualized. No aortic stenosis is present. Aortic valve mean gradient measures 6.0 mmHg. Aortic valve peak gradient measures 9.5 mmHg. Aortic valve area, by VTI measures 2.38 cm. Pulmonic Valve: The pulmonic valve was normal in structure. Pulmonic valve regurgitation is not visualized. No evidence of pulmonic stenosis. Aorta: The aortic root is normal in size and structure. Venous: The inferior vena cava is dilated in size with greater than 50% respiratory variability, suggesting right atrial pressure of 8 mmHg. IAS/Shunts: No atrial level shunt detected by color flow Doppler.  LEFT VENTRICLE PLAX 2D LVIDd:         4.00 cm      Diastology LVIDs:         3.00 cm      LV e' medial:    10.80 cm/s LV PW:         1.10 cm      LV E/e' medial:  12.0 LV IVS:        1.20 cm      LV e' lateral:   15.20 cm/s LVOT diam:     2.10 cm      LV E/e' lateral: 8.6 LV SV:         70 LV SV Index:   30 LVOT Area:     3.46 cm  LV Volumes (MOD)  LV vol d, MOD A2C: 112.0 ml LV vol d, MOD A4C: 96.0 ml LV vol s, MOD A2C: 53.6 ml LV vol s, MOD A4C: 46.9 ml LV SV MOD A2C:     58.4 ml LV SV MOD A4C:     96.0 ml LV SV MOD BP:      54.3 ml RIGHT VENTRICLE             IVC RV Basal diam:  4.20 cm     IVC diam: 2.50 cm RV S prime:     10.30 cm/s TAPSE (M-mode): 1.7 cm LEFT ATRIUM              Index        RIGHT ATRIUM           Index LA diam:        3.80 cm 1.65 cm/m   RA Area:     19.60 cm LA Vol (A2C):   65.1 ml 28.27 ml/m  RA Volume:   52.10 ml  22.62 ml/m LA Vol (A4C):   57.5 ml 24.97 ml/m LA Biplane Vol: 62.1 ml 26.96 ml/m  AORTIC VALVE AV Area (Vmax):    2.21 cm AV Area (Vmean):   2.16 cm AV Area (VTI):     2.38 cm AV Vmax:           154.00 cm/s AV Vmean:          114.000 cm/s AV VTI:            0.292 m AV Peak Grad:      9.5 mmHg AV Mean Grad:      6.0 mmHg LVOT Vmax:         98.30 cm/s LVOT Vmean:        71.100 cm/s LVOT VTI:          0.201 m LVOT/AV VTI ratio: 0.69  AORTA Ao Root diam: 2.90 cm Ao Asc diam:  3.00 cm MITRAL VALVE                TRICUSPID VALVE MV Area (PHT): 5.16 cm     TR Peak grad:   17.3 mmHg MV Decel Time: 147 msec     TR Vmax:        208.00 cm/s MV E velocity: 130.00 cm/s                             SHUNTS                             Systemic VTI:  0.20 m                             Systemic Diam: 2.10 cm Glori Bickers MD Electronically signed by Glori Bickers MD Signature Date/Time: 11/14/2022/4:01:22 PM    Final    DG Abd 1 View  Result Date: 11/14/2022 CLINICAL DATA:  History of constipation EXAM: ABDOMEN - 1 VIEW COMPARISON:  None Available. FINDINGS: Bladder is partially distended with opacified urine. Scattered large and small bowel gas is noted. No obstructive changes are seen. No significant fecal retention to suggest constipation is noted. No free air is seen. IMPRESSION: No acute abnormality noted. Electronically Signed   By: Inez Catalina M.D.   On: 11/14/2022 00:05   CT Angio Chest PE W and/or Wo Contrast  Result  Date: 11/13/2022 CLINICAL DATA:  Concern for pulmonary embolism. EXAM: CT ANGIOGRAPHY CHEST WITH CONTRAST TECHNIQUE: Multidetector CT imaging of the chest was performed using the standard protocol during bolus administration of intravenous contrast. Multiplanar CT image reconstructions and MIPs were obtained to evaluate the  vascular anatomy. RADIATION DOSE REDUCTION: This exam was performed according to the departmental dose-optimization program which includes automated exposure control, adjustment of the mA and/or kV according to patient size and/or use of iterative reconstruction technique. CONTRAST:  116m OMNIPAQUE IOHEXOL 350 MG/ML SOLN COMPARISON:  Chest radiograph dated 11/13/2022. FINDINGS: Evaluation of this exam is limited due to respiratory motion artifact. Cardiovascular: There is no cardiomegaly or pericardial effusion. The thoracic aorta is unremarkable. Evaluation of the pulmonary arteries is limited due to respiratory motion. No obvious large or central pulmonary artery embolus identified. Mediastinum/Nodes: Right hilar adenopathy measures 13 mm short axis. The esophagus is grossly unremarkable. No mediastinal fluid collection. Lungs/Pleura: There is scattered hazy and ground-glass density throughout the lungs with mosaic appearance, likely related to underlying small airway or small vessel disease. No focal consolidation. There is no pleural effusion or pneumothorax. The central airways are patent. Upper Abdomen: Fatty liver. Musculoskeletal: Mild degenerative changes. No acute osseous pathology. Review of the MIP images confirms the above findings. IMPRESSION: 1. No acute intrathoracic pathology. No obvious large or central pulmonary artery embolus identified. 2. Fatty liver. 3. Right hilar adenopathy, likely reactive. Electronically Signed   By: AAnner CreteM.D.   On: 11/13/2022 23:22   DG Chest Portable 1 View  Result Date: 11/13/2022 CLINICAL DATA:  Provided history: Shortness of breath. Chest pain. History of SVT. EXAM: PORTABLE CHEST 1 VIEW COMPARISON:  Prior chest radiographs 08/15/2022 and earlier. FINDINGS: Cardiac monitoring pads overlie the chest. Heart size within normal limits. No appreciable airspace consolidation or pulmonary edema. No evidence of pleural effusion or pneumothorax. No acute  osseous abnormality identified. IMPRESSION: No evidence of active cardiopulmonary disease. Electronically Signed   By: KKellie SimmeringD.O.   On: 11/13/2022 18:19     Scheduled Meds:  carvedilol  6.25 mg Oral BID WC   DULoxetine  60 mg Oral Daily   furosemide  20 mg Intravenous Daily   furosemide  40 mg Intravenous Once   insulin aspart  0-9 Units Subcutaneous Q4H   pantoprazole (PROTONIX) IV  40 mg Intravenous Q12H   [START ON 11/16/2022] pneumococcal 20-valent conjugate vaccine  0.5 mL Intramuscular Tomorrow-1000   sacubitril-valsartan  1 tablet Oral BID   Continuous Infusions:  heparin 1,800 Units/hr (11/15/22 1433)     LOS: 1 day   Time spent: 4[place] MD [facility] To contact the attending provider between 7A-7P or the covering provider during after hours 7P-7A, please log into the web site [email or web address] and access using universal [place] password for that web site. If you do not have the password, please call the hospital operator.  11/15/2022, 4:00 PM

## 2022-11-15 NOTE — Interval H&P Note (Signed)
History and Physical Interval Note:  11/15/2022 12:22 PM  Debbie Lee  has presented today for surgery, with the diagnosis of anemia.  The various methods of treatment have been discussed with the patient and family. After consideration of risks, benefits and other options for treatment, the patient has consented to  Procedure(s): ESOPHAGOGASTRODUODENOSCOPY (EGD) (N/A) as a surgical intervention.  The patient's history has been reviewed, patient examined, no change in status, stable for surgery.  I have reviewed the patient's chart and labs.  Questions were answered to the patient's satisfaction.     Debbie Lee

## 2022-11-15 NOTE — Progress Notes (Addendum)
Pt refused cpap tonight.  Pt unable to tolerate it all night last night.  RN notified.  Pt encouraged to call should she change her mind.  RN aware.  Cpap machine remains in room.

## 2022-11-15 NOTE — Progress Notes (Signed)
Subjective:   Hungry has been NPO GI plans on EGD/Colon so we will need To cancel TEE/DCC that was scheduled for am  Objective:  Vitals:   11/15/22 0110 11/15/22 0313 11/15/22 0319 11/15/22 0740  BP:   120/77 124/76  Pulse:   80 90  Resp:   (!) 22 19  Temp:   98.9 F (37.2 C) 98.5 F (36.9 C)  TempSrc:   Oral Oral  SpO2: 99%  96% 98%  Weight:  (!) 155.2 kg    Height:        Intake/Output from previous day:  Intake/Output Summary (Last 24 hours) at 11/15/2022 1023 Last data filed at 11/15/2022 0710 Gross per 24 hour  Intake 1842.71 ml  Output 3300 ml  Net -1457.29 ml    Physical Exam:  Obese female Lungs clear Distant heart sounds Lungs clear Plus one edema  Lab Results: Basic Metabolic Panel: Recent Labs    11/13/22 1758 11/14/22 0248 11/14/22 0533  NA 138  --  134*  K 4.1  --  3.8  CL 103  --  100  CO2 25  --  25  GLUCOSE 192*  --  176*  BUN 15  --  14  CREATININE 1.01*  --  0.81  CALCIUM 8.6*  --  8.0*  MG 1.9  --  1.9  PHOS  --  3.4 3.4   Liver Function Tests: Recent Labs    11/14/22 0248 11/14/22 0533  AST 28 33  ALT 29 29  ALKPHOS 69 71  BILITOT 1.3* 1.4*  PROT 6.7 6.6  ALBUMIN 3.0* 3.1*   No results for input(s): "LIPASE", "AMYLASE" in the last 72 hours. CBC: Recent Labs    11/13/22 1758 11/14/22 0030 11/14/22 0919 11/15/22 0014  WBC 11.1*   < > 11.2* 12.1*  NEUTROABS 8.3*  --   --   --   HGB 8.8*   < > 8.0* 7.9*  HCT 32.9*   < > 29.4* 29.2*  MCV 75.5*   < > 75.4* 75.6*  PLT 335   < > 286 292   < > = values in this interval not displayed.   Cardiac Enzymes: Recent Labs    11/14/22 0248  CKTOTAL 42   BNP: Invalid input(s): "POCBNP" D-Dimer: Recent Labs    11/13/22 1829  DDIMER 1.21*   Hemoglobin A1C: Recent Labs    11/14/22 0248  HGBA1C 9.3*   Fasting Lipid Panel: No results for input(s): "CHOL", "HDL", "LDLCALC", "TRIG", "CHOLHDL", "LDLDIRECT" in the last 72 hours. Thyroid Function Tests: Recent Labs     11/13/22 1758  TSH 6.845*   Anemia Panel: Recent Labs    11/13/22 1845 11/14/22 0248  VITAMINB12  --  244  FOLATE  --  8.4  FERRITIN 14  --   TIBC 431  --   IRON 29  --   RETICCTPCT  --  2.8    Imaging: VAS [place] LOWER EXTREMITY VENOUS (DVT)  Result Date: 11/14/2022  Lower Venous DVT Study Patient Name:  TAILYNN SYVERSON  Date of Exam:   11/14/2022 Medical Rec #: [id number]    Accession #:    [id number] Date of Birth: 50-15-1972    Patient Gender: F Patient Age:   52 years Exam Location:  [facility] Procedure:      VAS [place] LOWER EXTREMITY VENOUS (DVT) Referring Phys: Nyoka Lint DOUTOVA --------------------------------------------------------------------------------  Indications: D-dimer 1.2.  Anticoagulation: Heparin. Limitations: Body habitus. Comparison Study: No prior studies. Performing  Technologist: Darlin Coco RDMS, RVT  Examination Guidelines: A complete evaluation includes B-mode imaging, spectral Doppler, color Doppler, and power Doppler as needed of all accessible portions of each vessel. Bilateral testing is considered an integral part of a complete examination. Limited examinations for reoccurring indications may be performed as noted. The reflux portion of the exam is performed with the patient in reverse Trendelenburg.  +---------+---------------+---------+-----------+----------+-------------------+ RIGHT    CompressibilityPhasicitySpontaneityPropertiesThrombus Aging      +---------+---------------+---------+-----------+----------+-------------------+ CFV      Full           Yes      Yes                                      +---------+---------------+---------+-----------+----------+-------------------+ SFJ      Full                                                             +---------+---------------+---------+-----------+----------+-------------------+ FV Prox  Full                                                              +---------+---------------+---------+-----------+----------+-------------------+ FV Mid   Full                                                             +---------+---------------+---------+-----------+----------+-------------------+ FV DistalFull                                                             +---------+---------------+---------+-----------+----------+-------------------+ PFV      Full                                                             +---------+---------------+---------+-----------+----------+-------------------+ POP      Full           Yes      Yes                                      +---------+---------------+---------+-----------+----------+-------------------+ PTV      Full                                                             +---------+---------------+---------+-----------+----------+-------------------+  PERO                                                  Not well visualized +---------+---------------+---------+-----------+----------+-------------------+   +---------+---------------+---------+-----------+----------+--------------+ LEFT     CompressibilityPhasicitySpontaneityPropertiesThrombus Aging +---------+---------------+---------+-----------+----------+--------------+ CFV      Full           Yes      Yes                                 +---------+---------------+---------+-----------+----------+--------------+ SFJ      Full                                                        +---------+---------------+---------+-----------+----------+--------------+ FV Prox  Full                                                        +---------+---------------+---------+-----------+----------+--------------+ FV Mid   Full                                                        +---------+---------------+---------+-----------+----------+--------------+ FV DistalFull                                                         +---------+---------------+---------+-----------+----------+--------------+ PFV      Full                                                        +---------+---------------+---------+-----------+----------+--------------+ POP      Full           Yes      Yes                                 +---------+---------------+---------+-----------+----------+--------------+ PTV      Full                                                        +---------+---------------+---------+-----------+----------+--------------+ PERO     Full                                                        +---------+---------------+---------+-----------+----------+--------------+  Summary: RIGHT: - There is no evidence of deep vein thrombosis in the lower extremity. However, portions of this examination were limited- see technologist comments above.  - No cystic structure found in the popliteal fossa.  LEFT: - There is no evidence of deep vein thrombosis in the lower extremity.  - No cystic structure found in the popliteal fossa.  *See table(s) above for measurements and observations. Electronically signed by Servando Snare MD on 11/14/2022 at 4:27:56 PM.    Final    ECHOCARDIOGRAM COMPLETE  Result Date: 11/14/2022    ECHOCARDIOGRAM REPORT   Patient Name:   DAWNESHA MENKE Date of Exam: 11/14/2022 Medical Rec #:  [id number]   Height:       62.0 in Accession #:    [id number]  Weight:       310.0 lb Date of Birth:  1971-03-11   BSA:          2.303 m Patient Age:    70 years    BP:           122/72 mmHg Patient Gender: F           HR:           74 bpm. Exam Location:  Inpatient Procedure: 2D Echo, Color Doppler and Cardiac Doppler Indications:    I48.92* Unspecified atrial flutter  History:        Patient has no prior history of Echocardiogram examinations.                 Risk Factors:Hypertension.  Sonographer:    Phineas Douglas Referring Phys: [id number] [place]  1. Left ventricular  ejection fraction, by estimation, is 30 to 35%. The left ventricle has moderately decreased function. The left ventricle demonstrates global hypokinesis. Left ventricular diastolic function could not be evaluated.  2. Right ventricular systolic function is moderately reduced. The right ventricular size is normal. There is normal pulmonary artery systolic pressure.  3. Left atrial size was mildly dilated.  4. Right atrial size was mildly dilated.  5. The mitral valve is normal in structure. No evidence of mitral valve regurgitation. No evidence of mitral stenosis.  6. The aortic valve is tricuspid. Aortic valve regurgitation is not visualized. No aortic stenosis is present.  7. The inferior vena cava is dilated in size with >50% respiratory variability, suggesting right atrial pressure of 8 mmHg. FINDINGS  Left Ventricle: Left ventricular ejection fraction, by estimation, is 30 to 35%. The left ventricle has moderately decreased function. The left ventricle demonstrates global hypokinesis. The left ventricular internal cavity size was normal in size. There is no left ventricular hypertrophy. Left ventricular diastolic function could not be evaluated due to atrial fibrillation. Left ventricular diastolic function could not be evaluated. Right Ventricle: The right ventricular size is normal. No increase in right ventricular wall thickness. Right ventricular systolic function is moderately reduced. There is normal pulmonary artery systolic pressure. The tricuspid regurgitant velocity is 2.08 m/s, and with an assumed right atrial pressure of 15 mmHg, the estimated right ventricular systolic pressure is [id number] mmHg. Left Atrium: Left atrial size was mildly dilated. Right Atrium: Right atrial size was mildly dilated. Pericardium: There is no evidence of pericardial effusion. Mitral Valve: The mitral valve is normal in structure. No evidence of mitral valve regurgitation. No evidence of mitral valve stenosis. Tricuspid Valve:  The tricuspid valve is normal in structure. Tricuspid valve regurgitation is mild . No evidence of tricuspid stenosis. Aortic Valve: The aortic valve is  tricuspid. Aortic valve regurgitation is not visualized. No aortic stenosis is present. Aortic valve mean gradient measures 6.0 mmHg. Aortic valve peak gradient measures 9.5 mmHg. Aortic valve area, by VTI measures 2.38 cm. Pulmonic Valve: The pulmonic valve was normal in structure. Pulmonic valve regurgitation is not visualized. No evidence of pulmonic stenosis. Aorta: The aortic root is normal in size and structure. Venous: The inferior vena cava is dilated in size with greater than 50% respiratory variability, suggesting right atrial pressure of 8 mmHg. IAS/Shunts: No atrial level shunt detected by color flow Doppler.  LEFT VENTRICLE PLAX 2D LVIDd:         4.00 cm      Diastology LVIDs:         3.00 cm      LV e' medial:    10.80 cm/s LV PW:         1.10 cm      LV E/e' medial:  12.0 LV IVS:        1.20 cm      LV e' lateral:   15.20 cm/s LVOT diam:     2.10 cm      LV E/e' lateral: 8.6 LV SV:         70 LV SV Index:   30 LVOT Area:     3.46 cm  LV Volumes (MOD) LV vol d, MOD A2C: 112.0 ml LV vol d, MOD A4C: 96.0 ml LV vol s, MOD A2C: 53.6 ml LV vol s, MOD A4C: 46.9 ml LV SV MOD A2C:     58.4 ml LV SV MOD A4C:     96.0 ml LV SV MOD BP:      54.3 ml RIGHT VENTRICLE             IVC RV Basal diam:  4.20 cm     IVC diam: 2.50 cm RV S prime:     10.30 cm/s TAPSE (M-mode): 1.7 cm LEFT ATRIUM             Index        RIGHT ATRIUM           Index LA diam:        3.80 cm 1.65 cm/m   RA Area:     19.60 cm LA Vol (A2C):   65.1 ml 28.27 ml/m  RA Volume:   52.10 ml  22.62 ml/m LA Vol (A4C):   57.5 ml 24.97 ml/m LA Biplane Vol: 62.1 ml 26.96 ml/m  AORTIC VALVE AV Area (Vmax):    2.21 cm AV Area (Vmean):   2.16 cm AV Area (VTI):     2.38 cm AV Vmax:           154.00 cm/s AV Vmean:          114.000 cm/s AV VTI:            0.292 m AV Peak Grad:      9.5 mmHg AV Mean  Grad:      6.0 mmHg LVOT Vmax:         98.30 cm/s LVOT Vmean:        71.100 cm/s LVOT VTI:          0.201 m LVOT/AV VTI ratio: 0.69  AORTA Ao Root diam: 2.90 cm Ao Asc diam:  3.00 cm MITRAL VALVE                TRICUSPID VALVE MV Area (PHT): 5.16 cm     TR Peak  grad:   17.3 mmHg MV Decel Time: 147 msec     TR Vmax:        208.00 cm/s MV E velocity: 130.00 cm/s                             SHUNTS                             Systemic VTI:  0.20 m                             Systemic Diam: 2.10 cm Glori Bickers MD Electronically signed by Glori Bickers MD Signature Date/Time: 11/14/2022/4:01:22 PM    Final    DG Abd 1 View  Result Date: 11/14/2022 CLINICAL DATA:  History of constipation EXAM: ABDOMEN - 1 VIEW COMPARISON:  None Available. FINDINGS: Bladder is partially distended with opacified urine. Scattered large and small bowel gas is noted. No obstructive changes are seen. No significant fecal retention to suggest constipation is noted. No free air is seen. IMPRESSION: No acute abnormality noted. Electronically Signed   By: Inez Catalina M.D.   On: 11/14/2022 00:05   CT Angio Chest PE W and/or Wo Contrast  Result Date: 11/13/2022 CLINICAL DATA:  Concern for pulmonary embolism. EXAM: CT ANGIOGRAPHY CHEST WITH CONTRAST TECHNIQUE: Multidetector CT imaging of the chest was performed using the standard protocol during bolus administration of intravenous contrast. Multiplanar CT image reconstructions and MIPs were obtained to evaluate the vascular anatomy. RADIATION DOSE REDUCTION: This exam was performed according to the departmental dose-optimization program which includes automated exposure control, adjustment of the mA and/or kV according to patient size and/or use of iterative reconstruction technique. CONTRAST:  17m OMNIPAQUE IOHEXOL 350 MG/ML SOLN COMPARISON:  Chest radiograph dated 11/13/2022. FINDINGS: Evaluation of this exam is limited due to respiratory motion artifact. Cardiovascular: There is  no cardiomegaly or pericardial effusion. The thoracic aorta is unremarkable. Evaluation of the pulmonary arteries is limited due to respiratory motion. No obvious large or central pulmonary artery embolus identified. Mediastinum/Nodes: Right hilar adenopathy measures 13 mm short axis. The esophagus is grossly unremarkable. No mediastinal fluid collection. Lungs/Pleura: There is scattered hazy and ground-glass density throughout the lungs with mosaic appearance, likely related to underlying small airway or small vessel disease. No focal consolidation. There is no pleural effusion or pneumothorax. The central airways are patent. Upper Abdomen: Fatty liver. Musculoskeletal: Mild degenerative changes. No acute osseous pathology. Review of the MIP images confirms the above findings. IMPRESSION: 1. No acute intrathoracic pathology. No obvious large or central pulmonary artery embolus identified. 2. Fatty liver. 3. Right hilar adenopathy, likely reactive. Electronically Signed   By: AAnner CreteM.D.   On: 11/13/2022 23:22   DG Chest Portable 1 View  Result Date: 11/13/2022 CLINICAL DATA:  Provided history: Shortness of breath. Chest pain. History of SVT. EXAM: PORTABLE CHEST 1 VIEW COMPARISON:  Prior chest radiographs 08/15/2022 and earlier. FINDINGS: Cardiac monitoring pads overlie the chest. Heart size within normal limits. No appreciable airspace consolidation or pulmonary edema. No evidence of pleural effusion or pneumothorax. No acute osseous abnormality identified. IMPRESSION: No evidence of active cardiopulmonary disease. Electronically Signed   By: KKellie SimmeringD.O.   On: 11/13/2022 18:19    Cardiac Studies:  ECG: atrial flutter no acute ST changes    Telemetry:  Atrial flutter rate  80 bpm  Echo: EF 30-35% mild bi atrial enlargement   Medications:    carvedilol  6.25 mg Oral BID WC   DULoxetine  60 mg Oral Daily   furosemide  20 mg Intravenous Daily   insulin aspart  0-9 Units Subcutaneous Q4H    pantoprazole (PROTONIX) IV  40 mg Intravenous Q12H   [START ON 11/16/2022] pneumococcal 20-valent conjugate vaccine  0.5 mL Intramuscular Tomorrow-1000   sacubitril-valsartan  1 tablet Oral BID        Assessment/Plan:   Atrial flutter:  plan was for TEE/DCC in am Cancelled as she is holding blood thinners for EGD/colon Tentatively reschedule for Friday pending results of GI w/u Given decreased EF will d/c cardizem and Rx with coreg. DCM;  EF 30-35% ? Rate related from flutter. Laisx 20 mg iv daily and start entresto low dose as BP tolerates Consider adding aldactone prior to d/c Anemia:  per GI EGD today and ? When colon will be done Hct 29.2 today  Jenkins Rouge 11/15/2022, 10:23 AM

## 2022-11-15 NOTE — Progress Notes (Signed)
PHARMACY - HEPARIN  Patient with IV heparin gtt @ 1600 units/hr for atrial flutter.  Heparin level drawn @ 0014=0.11 subtherapeutic  Hgb 7.9, plts WNL  No bleeding complications per RN  Plan:  increase heparin drip to 1800 units/hr  Stop heparin infusion at 0630 on 11/15/22 (~6 hrs prior to GI procedure) per GI recommendations    Dolly Rias RPh 11/15/2022, 1:00 AM

## 2022-11-15 NOTE — Transfer of Care (Signed)
Immediate Anesthesia Transfer of Care Note  Patient: Debbie Lee  Procedure(s) Performed: ESOPHAGOGASTRODUODENOSCOPY (EGD)  Patient Location: PACU  Anesthesia Type:MAC  Level of Consciousness: awake, alert , and oriented  Airway & Oxygen Therapy: Patient Spontanous Breathing and Patient connected to face mask oxygen  Post-op Assessment: Report given to RN, Post -op Vital signs reviewed and stable, and Patient moving all extremities X 4  Post vital signs: Reviewed and stable  Last Vitals:  Vitals Value Taken Time  BP 146/79 11/15/22 1312  Temp    Pulse 94 11/15/22 1313  Resp 26 11/15/22 1313  SpO2 100 % 11/15/22 1313  Vitals shown include unvalidated device data.  Last Pain:  Vitals:   11/15/22 1238  TempSrc:   PainSc: 0-No pain      Patients Stated Pain Goal: 0 ([id number])  Complications: No notable events documented.

## 2022-11-15 NOTE — Brief Op Note (Signed)
11/13/2022 - 11/15/2022  1:01 PM  PATIENT:  Debbie Lee  52 y.o. female  PRE-OPERATIVE DIAGNOSIS:  anemia  POST-OPERATIVE DIAGNOSIS:  no bleeding noted  PROCEDURE:  Procedure(s): ESOPHAGOGASTRODUODENOSCOPY (EGD) (N/A)  SURGEON:  Surgeon(s) and Role:    * Micharl Helmes, MD - Primary   Findings ---------------- - Patient had significant oxygen desaturation during preop and during endoscopy.  No evidence of active bleeding seen during a brief endoscopy.  Scope was withdrawn quickly because of oxygen desaturation.  Recommendations ----------------------  - Anesthesia attending advised against proceeding with colonoscopy.   - Okay to start anticoagulation, antiplatelet from GI standpoint.  - Anesthesia recommended to do colonoscopy after cardiac workup is completed and her heart rate is under control.  -GI will follow from distance.  Otis Brace MD, [place] 11/15/2022, 1:03 PM  Contact #  [phone]

## 2022-11-15 NOTE — Anesthesia Postprocedure Evaluation (Signed)
Anesthesia Post Note  Patient: Debbie Lee  Procedure(s) Performed: ESOPHAGOGASTRODUODENOSCOPY (EGD)     Patient location during evaluation: Endoscopy Anesthesia Type: MAC Level of consciousness: awake and alert Pain management: pain level controlled Vital Signs Assessment: post-procedure vital signs reviewed and stable Respiratory status: spontaneous breathing, nonlabored ventilation, respiratory function stable and patient connected to nasal cannula oxygen Cardiovascular status: blood pressure returned to baseline and stable Postop Assessment: no apparent nausea or vomiting Anesthetic complications: no  No notable events documented.  Last Vitals:  Vitals:   11/15/22 1332 11/15/22 1337  BP:  129/80  Pulse: 74 98  Resp: 18 (!) 26  Temp:    SpO2: 97% 98%    Last Pain:  Vitals:   11/15/22 1337  TempSrc:   PainSc: 0-No pain                 Leomia Blake DANIEL

## 2022-11-15 NOTE — Anesthesia Preprocedure Evaluation (Addendum)
Anesthesia Evaluation  Patient identified by MRN, date of birth, ID band Patient awake    Reviewed: Allergy & Precautions, NPO status , Patient's Chart, lab work & pertinent test results, reviewed documented beta blocker date and time   History of Anesthesia Complications Negative for: history of anesthetic complications  Airway Mallampati: II  TM Distance: >3 FB Neck ROM: Full    Dental no notable dental hx. (+) Dental Advisory Given   Pulmonary former smoker   Pulmonary exam normal        Cardiovascular hypertension, Pt. on home beta blockers and Pt. on medications +CHF  Normal cardiovascular exam+ dysrhythmias Atrial Fibrillation (-) pacemaker  IMPRESSIONS     1. Left ventricular ejection fraction, by estimation, is 30 to 35%. The  left ventricle has moderately decreased function. The left ventricle  demonstrates global hypokinesis. Left ventricular diastolic function could  not be evaluated.   2. Right ventricular systolic function is moderately reduced. The right  ventricular size is normal. There is normal pulmonary artery systolic  pressure.   3. Left atrial size was mildly dilated.   4. Right atrial size was mildly dilated.   5. The mitral valve is normal in structure. No evidence of mitral valve  regurgitation. No evidence of mitral stenosis.   6. The aortic valve is tricuspid. Aortic valve regurgitation is not  visualized. No aortic stenosis is present.   7. The inferior vena cava is dilated in size with >50% respiratory  variability, suggesting right atrial pressure of 8 mmHg.      Neuro/Psych  PSYCHIATRIC DISORDERS Anxiety Depression    negative neurological ROS     GI/Hepatic negative GI ROS, Neg liver ROS,,,  Endo/Other    Morbid obesity  Renal/GU negative Renal ROS     Musculoskeletal  (+) Arthritis ,    Abdominal  (+) + obese  Peds  Hematology negative hematology ROS (+) Blood dyscrasia,  anemia   Anesthesia Other Findings   Reproductive/Obstetrics negative OB ROS                             Anesthesia Physical Anesthesia Plan  ASA: 3  Anesthesia Plan: MAC   Post-op Pain Management: GA combined w/ Regional for post-op pain and Minimal or no pain anticipated   Induction: Intravenous  PONV Risk Score and Plan: 2 and Ondansetron and Propofol infusion  Airway Management Planned: Simple Face Mask and Nasal CPAP  Additional Equipment: None  Intra-op Plan:   Post-operative Plan:   Informed Consent: I have reviewed the patients History and Physical, chart, labs and discussed the procedure including the risks, benefits and alternatives for the proposed anesthesia with the patient or authorized representative who has indicated his/her understanding and acceptance.     Dental advisory given  Plan Discussed with: Anesthesiologist and CRNA  Anesthesia Plan Comments:         Anesthesia Quick Evaluation

## 2022-11-15 NOTE — Progress Notes (Signed)
ANTICOAGULATION CONSULT NOTE - Follow Up Consult  Pharmacy Consult for heparin Indication: atrial fibrillation with RVR  Allergies  Allergen Reactions   Codeine Other (See Comments)    "Hallucinations. Narcotics cause vomiting."   Sulfa Antibiotics Other (See Comments)    "lifelong allergy"    Patient Measurements: Height: 5' 2"$  (157.5 cm) Weight: (!) 155.2 kg (342 lb 2.5 oz) IBW/kg (Calculated) : 50.1 Heparin Dosing Weight: 86 kg  Vital Signs: Temp: 98 F (36.7 C) (02/20 1314) Temp Source: Oral (02/20 1314) BP: 145/83 (02/20 1357) Pulse Rate: 99 (02/20 1357)  Labs: Recent Labs    11/13/22 1758 11/13/22 2035 11/13/22 2055 11/14/22 0030 11/14/22 0248 11/14/22 0533 11/14/22 0919 11/14/22 0930 11/14/22 1822 11/15/22 0014  HGB 8.8*  --   --    < > 8.3* 8.0* 8.0*  --   --  7.9*  HCT 32.9*  --   --    < > 30.8* 29.3* 29.4*  --   --  29.2*  PLT 335  --   --    < > 324 295 286  --   --  292  APTT  --   --  29  --   --   --   --   --   --   --   LABPROT  --   --  17.5*  --   --   --   --   --   --   --   INR  --   --  1.4*  --   --   --   --   --   --   --   HEPARINUNFRC  --   --   --    < >  --  <0.10*  --  <0.10* <0.10* 0.11*  CREATININE 1.01*  --   --   --   --  0.81  --   --   --   --   CKTOTAL  --   --   --   --  42  --   --   --   --   --   TROPONINIHS 4 5  --   --   --   --   --   --   --   --    < > = values in this interval not displayed.     Estimated Creatinine Clearance: 122.2 mL/min (by C-G formula based on SCr of 0.81 mg/dL).    Assessment: Patient's a 52 y.o F with hx afib who presented to the ED on 11/13/22 with c/o SOB and intermittent rectal bleeding with bowel movements. Chest CT was neg for PE.  Heparin drip started on 11/13/22 for afib with RVR.   Significant events:  - 2/20: heparin drip d/ced at 06:30a for EGD. EGD showed no signif. findings.  OK with Dr. Alessandra Bevels to resume heparin back s/p procedure.  Today, 11/15/2022: - hgb low but  somewhat stable at 7.9, plts ok.  - no bleeding documented   Goal of Therapy:  Heparin level 0.3-0.7 units/ml Monitor platelets by anticoagulation protocol: Yes   Plan:  - Resume heparin drip back at 1800 units/hr - check 6 hr heparin level - monitor for s/sx bleeding   Gianlucca Szymborski P 11/15/2022,1:59 PM

## 2022-11-15 NOTE — Anesthesia Procedure Notes (Signed)
Procedure Name: MAC Date/Time: 11/15/2022 12:50 PM  Performed by: Niel Hummer, CRNAPre-anesthesia Checklist: Patient identified, Emergency Drugs available, Suction available and Patient being monitored Oxygen Delivery Method: Simple face mask

## 2022-11-15 NOTE — Progress Notes (Signed)
Heparin gtt stopped @ 0625, no bleeding issues noted.

## 2022-11-16 ENCOUNTER — Inpatient Hospital Stay (HOSPITAL_COMMUNITY): Payer: BLUE CROSS/BLUE SHIELD

## 2022-11-16 ENCOUNTER — Encounter (HOSPITAL_COMMUNITY): Payer: Self-pay | Admitting: Internal Medicine

## 2022-11-16 DIAGNOSIS — E119 Type 2 diabetes mellitus without complications: Secondary | ICD-10-CM | POA: Diagnosis not present

## 2022-11-16 DIAGNOSIS — I483 Typical atrial flutter: Secondary | ICD-10-CM | POA: Diagnosis not present

## 2022-11-16 DIAGNOSIS — D649 Anemia, unspecified: Secondary | ICD-10-CM | POA: Diagnosis not present

## 2022-11-16 DIAGNOSIS — I10 Essential (primary) hypertension: Secondary | ICD-10-CM | POA: Diagnosis not present

## 2022-11-16 DIAGNOSIS — I4892 Unspecified atrial flutter: Secondary | ICD-10-CM | POA: Diagnosis not present

## 2022-11-16 LAB — COMPREHENSIVE METABOLIC PANEL
ALT: 25 U/L (ref 0–44)
AST: 24 U/L (ref 15–41)
Albumin: 3.2 g/dL — ABNORMAL LOW (ref 3.5–5.0)
Alkaline Phosphatase: 70 U/L (ref 38–126)
Anion gap: 10 (ref 5–15)
BUN: 12 mg/dL (ref 6–20)
CO2: 30 mmol/L (ref 22–32)
Calcium: 8.3 mg/dL — ABNORMAL LOW (ref 8.9–10.3)
Chloride: 96 mmol/L — ABNORMAL LOW (ref 98–111)
Creatinine, Ser: 0.81 mg/dL (ref 0.44–1.00)
GFR, Estimated: >60 mL/min (ref >60)
Glucose, Bld: 181 mg/dL — ABNORMAL HIGH (ref 70–99)
Potassium: 3.8 mmol/L (ref 3.5–5.1)
Sodium: 136 mmol/L (ref 135–145)
Total Bilirubin: 1.4 mg/dL — ABNORMAL HIGH (ref 0.3–1.2)
Total Protein: 6.6 g/dL (ref 6.5–8.1)

## 2022-11-16 LAB — CBC
HCT: 32 % — ABNORMAL LOW (ref 36.0–46.0)
Hemoglobin: 8.6 g/dL — ABNORMAL LOW (ref 12.0–15.0)
MCH: 20.2 pg — ABNORMAL LOW (ref 26.0–34.0)
MCHC: 26.9 g/dL — ABNORMAL LOW (ref 30.0–36.0)
MCV: 75.1 fL — ABNORMAL LOW (ref 80.0–100.0)
Platelets: 309 10*3/uL (ref 150–400)
RBC: 4.26 MIL/uL (ref 3.87–5.11)
RDW: 19.4 % — ABNORMAL HIGH (ref 11.5–15.5)
WBC: 10.9 10*3/uL — ABNORMAL HIGH (ref 4.0–10.5)
nRBC: 0 % (ref 0.0–0.2)

## 2022-11-16 LAB — GLUCOSE, CAPILLARY
Glucose-Capillary: 158 mg/dL — ABNORMAL HIGH (ref 70–99)
Glucose-Capillary: 182 mg/dL — ABNORMAL HIGH (ref 70–99)
Glucose-Capillary: 202 mg/dL — ABNORMAL HIGH (ref 70–99)
Glucose-Capillary: 211 mg/dL — ABNORMAL HIGH (ref 70–99)
Glucose-Capillary: 243 mg/dL — ABNORMAL HIGH (ref 70–99)
Glucose-Capillary: 281 mg/dL — ABNORMAL HIGH (ref 70–99)

## 2022-11-16 LAB — T3: T3, Total: 134 ng/dL (ref 71–180)

## 2022-11-16 LAB — MAGNESIUM: Magnesium: 2.2 mg/dL (ref 1.7–2.4)

## 2022-11-16 LAB — OCCULT BLOOD X 1 CARD TO LAB, STOOL: Fecal Occult Bld: NEGATIVE

## 2022-11-16 MED ORDER — PANTOPRAZOLE SODIUM 40 MG PO TBEC
40.0000 mg | DELAYED_RELEASE_TABLET | Freq: Every day | ORAL | Status: DC
  Administered 2022-11-17 – 2022-11-20 (×4): 40 mg via ORAL
  Filled 2022-11-16 (×4): qty 1

## 2022-11-16 NOTE — Plan of Care (Signed)
  Problem: Coping: Goal: Ability to adjust to condition or change in health will improve Outcome: Progressing   Problem: Fluid Volume: Goal: Ability to maintain a balanced intake and output will improve Outcome: Progressing   Problem: Health Behavior/Discharge Planning: Goal: Ability to manage health-related needs will improve Outcome: Progressing   

## 2022-11-16 NOTE — Progress Notes (Addendum)
[facility]                                                                               Debbie Lee, is a 52 y.o. 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 Came to [facility] ED 2/18 to 2:1 flutter vagal maneuver unsuccessful . She was found to be in atrial flutter. Cardiology consulted and shew as started on IV cardizem gtt.   Assessment & Plan    Assessment and Plan:  Atrial flutter  Rate not well controlled.  Cardiology consulted and plan for TEE cardioversion on Friday.  She is currently on cardizem gtt.  Echocardiogram showed 30 to 35%. The  left ventricle has moderately decreased function. The left ventricle  demonstrates global hypokinesis. Left ventricular diastolic function could  not be evaluated.  Started her on coreg.    Acute systolic heart failure:  On IV lasix.   Iron deficiency anemia:  Microcytic anemia.  No plans for colonoscopy per GI.  S/p EGD on 11/15/2022, no bleeding seen. EGD wnl.  Continue with PPI daily.    OSA on CPAP;     Hypertension;  Well controlled.     H/o SVT: Converted adenosine.    Type 2 DM:  - Hemoglobin A1c is 9.3%. CBG (last 3)  Recent Labs    11/16/22 0408 11/16/22 0728 11/16/22 1150  GLUCAP 182* 158* 202*   Resume SSI.   Depression  Resume cymbalta .  Body mass index is 60.38 kg/m. Morbid obesity. Outpatient follow up with PCP.    Estimated body mass index is 60.38 kg/m as calculated from the following:   Height as of this encounter: 5' 2"$  (1.575 m).   Weight as of this encounter: 149.7 kg.  Code Status: full code.  DVT Prophylaxis:   apixaban (ELIQUIS) tablet 5 mg   Level of Care: Level of care:  Progressive Family Communication: none at bedside.   Disposition Plan:     pending TEE/ Cardioversion.   Procedures:  Echocardiogram.  TEE and cardioversion scheduled on 2/23/   Consultants:   Cardiology Gastroenterology.   Antimicrobials:   Anti-infectives (From admission, onward)    None        Medications  Scheduled Meds:  apixaban  5 mg Oral BID   carvedilol  6.25 mg Oral BID WC   DULoxetine  60 mg Oral Daily   furosemide  20 mg Intravenous Daily   insulin aspart  0-9 Units Subcutaneous Q4H   [START ON 11/17/2022] metFORMIN  500 mg Oral BID WC   pantoprazole  40 mg Oral Q0600   pneumococcal 20-valent conjugate vaccine  0.5 mL Intramuscular Tomorrow-1000   sacubitril-valsartan  1 tablet Oral BID   Continuous Infusions:  diltiazem (CARDIZEM) infusion 15 mg/hr (11/16/22 0937)   PRN Meds:.acetaminophen **OR** acetaminophen, HYDROcodone-acetaminophen    Subjective:   Debbie Lee  was seen and examined today.  Some dizziness with walking. No chest pain.   Objective:   Vitals:   11/16/22 0609 11/16/22 0800 11/16/22 0928 11/16/22 1152  BP: 117/72 124/74 133/72   Pulse: 97 (!) 48 (!) 102   Resp: (!) 22 (!) 22    Temp: 98.1 F (36.7 C)  98.3 F (36.8 C) 98.6 F (37 C)  TempSrc: Oral  Oral Axillary  SpO2: 95% 98%    Weight:      Height:        Intake/Output Summary (Last 24 hours) at 11/16/2022 1301 Last data filed at 11/16/2022 1153 Gross per 24 hour  Intake 1511.41 ml  Output 7150 ml  Net -5638.59 ml   Filed Weights   11/15/22 0313 11/15/22 1203 11/16/22 0500  Weight: (!) 155.2 kg (!) 155.2 kg (!) 149.7 kg     Exam General: Alert and oriented x 3, NAD Cardiovascular:tachycardic, irregularly irregular.  Respiratory: Clear to auscultation bilaterally, no wheezing, rales or rhonchi Gastrointestinal: Soft, nontender, nondistended, + bowel sounds Ext: no pedal edema bilaterally Neuro: AAOx3, Cr N's II- XII. Strength 5/5 upper and lower extremities  bilaterally Skin: No rashes Psych: Normal affect and demeanor, alert and oriented x3    Data Reviewed:  I have personally reviewed following labs and imaging studies   CBC Lab Results  Component Value Date   WBC 10.9 (H) 11/16/2022   RBC 4.26 11/16/2022   HGB 8.6 (L) 11/16/2022   HCT 32.0 (L) 11/16/2022   MCV 75.1 (L) 11/16/2022   MCH 20.2 (L) 11/16/2022   PLT 309 11/16/2022   MCHC 26.9 (L) 11/16/2022   RDW 19.4 (H) 11/16/2022   LYMPHSABS 2.0 11/13/2022   MONOABS 0.5 11/13/2022   EOSABS 0.2 11/13/2022   BASOSABS 0.0 123456     Last metabolic panel Lab Results  Component Value Date   NA 136 11/16/2022   K 3.8 11/16/2022   CL 96 (L) 11/16/2022   CO2 30 11/16/2022   BUN 12 11/16/2022   CREATININE 0.81 11/16/2022   GLUCOSE 181 (H) 11/16/2022   GFRNONAA >60 11/16/2022   GFRAA >60 10/29/2019   CALCIUM 8.3 (L) 11/16/2022   PHOS 3.4 11/14/2022   PROT 6.6 11/16/2022   ALBUMIN 3.2 (L) 11/16/2022   BILITOT 1.4 (H) 11/16/2022   ALKPHOS 70 11/16/2022   AST 24 11/16/2022   ALT 25 11/16/2022   ANIONGAP 10 11/16/2022    CBG (last 3)  Recent Labs    11/16/22 0408 11/16/22 0728 11/16/22 1150  GLUCAP 182* 158* 202*      Coagulation Profile: Recent Labs  Lab 11/13/22 2055  INR 1.4*     Radiology Studies: VAS [place] LOWER EXTREMITY VENOUS (DVT)  Result Date: 11/14/2022  Lower Venous DVT Study Patient Name:  Debbie Lee  Date of Exam:   11/14/2022 Medical Rec #: [id number]    Accession #:    [id number] Date of Birth: 1971/01/14    Patient Gender: F Patient Age:   20 years Exam Location:  [facility] Procedure:      VAS [place] LOWER EXTREMITY VENOUS (DVT) Referring Phys: Nyoka Lint DOUTOVA --------------------------------------------------------------------------------  Indications: D-dimer 1.2.  Anticoagulation: Heparin. Limitations: Body habitus. Comparison Study: No prior studies. Performing Technologist: Darlin Coco RDMS, RVT  Examination Guidelines: A complete  evaluation includes B-mode imaging, spectral Doppler, color Doppler, and power Doppler as needed of all accessible portions of each vessel. Bilateral testing is considered an integral part of a complete examination. Limited examinations for  reoccurring indications may be performed as noted. The reflux portion of the exam is performed with the patient in reverse Trendelenburg.  +---------+---------------+---------+-----------+----------+-------------------+ RIGHT    CompressibilityPhasicitySpontaneityPropertiesThrombus Aging      +---------+---------------+---------+-----------+----------+-------------------+ CFV      Full           Yes      Yes                                      +---------+---------------+---------+-----------+----------+-------------------+ SFJ      Full                                                             +---------+---------------+---------+-----------+----------+-------------------+ FV Prox  Full                                                             +---------+---------------+---------+-----------+----------+-------------------+ FV Mid   Full                                                             +---------+---------------+---------+-----------+----------+-------------------+ FV DistalFull                                                             +---------+---------------+---------+-----------+----------+-------------------+ PFV      Full                                                             +---------+---------------+---------+-----------+----------+-------------------+ POP      Full           Yes      Yes                                      +---------+---------------+---------+-----------+----------+-------------------+ PTV      Full                                                             +---------+---------------+---------+-----------+----------+-------------------+ PERO  Not well visualized +---------+---------------+---------+-----------+----------+-------------------+   +---------+---------------+---------+-----------+----------+--------------+ LEFT     CompressibilityPhasicitySpontaneityPropertiesThrombus Aging +---------+---------------+---------+-----------+----------+--------------+ CFV      Full           Yes      Yes                                 +---------+---------------+---------+-----------+----------+--------------+ SFJ      Full                                                        +---------+---------------+---------+-----------+----------+--------------+ FV Prox  Full                                                        +---------+---------------+---------+-----------+----------+--------------+ FV Mid   Full                                                        +---------+---------------+---------+-----------+----------+--------------+ FV DistalFull                                                        +---------+---------------+---------+-----------+----------+--------------+ PFV      Full                                                        +---------+---------------+---------+-----------+----------+--------------+ POP      Full           Yes      Yes                                 +---------+---------------+---------+-----------+----------+--------------+ PTV      Full                                                        +---------+---------------+---------+-----------+----------+--------------+ PERO     Full                                                        +---------+---------------+---------+-----------+----------+--------------+     Summary: RIGHT: - There is no evidence of deep vein thrombosis in the lower extremity. However, portions of this examination were limited- see technologist comments above.  - No cystic structure found in the  popliteal fossa.  LEFT: - There is no evidence of deep vein thrombosis in the lower extremity.  - No cystic structure found in the popliteal fossa.  *See table(s) above for measurements and observations. Electronically signed by Servando Snare MD on 11/14/2022 at 4:27:56 PM.    Final    ECHOCARDIOGRAM COMPLETE  Result Date: 11/14/2022    ECHOCARDIOGRAM REPORT   Patient Name:   Debbie Lee Date of Exam: 11/14/2022 Medical Rec #:  [id number]   Height:       62.0 in Accession #:    [id number]  Weight:       310.0 lb Date of Birth:  [birth year]   BSA:          2.303 m Patient Age:    24 years    BP:           122/72 mmHg Patient Gender: F           HR:           74 bpm. Exam Location:  Inpatient Procedure: 2D Echo, Color Doppler and Cardiac Doppler Indications:    I48.92* Unspecified atrial flutter  History:        Patient has no prior history of Echocardiogram examinations.                 Risk Factors:Hypertension.  Sonographer:    Phineas Douglas Referring Phys: [id number] [place]  1. Left ventricular ejection fraction, by estimation, is 30 to 35%. The left ventricle has moderately decreased function. The left ventricle demonstrates global hypokinesis. Left ventricular diastolic function could not be evaluated.  2. Right ventricular systolic function is moderately reduced. The right ventricular size is normal. There is normal pulmonary artery systolic pressure.  3. Left atrial size was mildly dilated.  4. Right atrial size was mildly dilated.  5. The mitral valve is normal in structure. No evidence of mitral valve regurgitation. No evidence of mitral stenosis.  6. The aortic valve is tricuspid. Aortic valve regurgitation is not visualized. No aortic stenosis is present.  7. The inferior vena cava is dilated in size with >50% respiratory variability, suggesting right atrial pressure of 8 mmHg. FINDINGS  Left Ventricle: Left ventricular ejection fraction, by estimation, is 30 to 35%. The left ventricle has  moderately decreased function. The left ventricle demonstrates global hypokinesis. The left ventricular internal cavity size was normal in size. There is no left ventricular hypertrophy. Left ventricular diastolic function could not be evaluated due to atrial fibrillation. Left ventricular diastolic function could not be evaluated. Right Ventricle: The right ventricular size is normal. No increase in right ventricular wall thickness. Right ventricular systolic function is moderately reduced. There is normal pulmonary artery systolic pressure. The tricuspid regurgitant velocity is 2.08 m/s, and with an assumed right atrial pressure of 15 mmHg, the estimated right ventricular systolic pressure is [id number] mmHg. Left Atrium: Left atrial size was mildly dilated. Right Atrium: Right atrial size was mildly dilated. Pericardium: There is no evidence of pericardial effusion. Mitral Valve: The mitral valve is normal in structure. No evidence of mitral valve regurgitation. No evidence of mitral valve stenosis. Tricuspid Valve: The tricuspid valve is normal in structure. Tricuspid valve regurgitation is mild . No evidence of tricuspid stenosis. Aortic Valve: The aortic valve is tricuspid. Aortic valve regurgitation is not visualized. No aortic stenosis is present. Aortic valve mean gradient measures 6.0 mmHg. Aortic valve peak gradient measures 9.5 mmHg. Aortic valve area, by VTI measures 2.38 cm. Pulmonic Valve: The pulmonic  valve was normal in structure. Pulmonic valve regurgitation is not visualized. No evidence of pulmonic stenosis. Aorta: The aortic root is normal in size and structure. Venous: The inferior vena cava is dilated in size with greater than 50% respiratory variability, suggesting right atrial pressure of 8 mmHg. IAS/Shunts: No atrial level shunt detected by color flow Doppler.  LEFT VENTRICLE PLAX 2D LVIDd:         4.00 cm      Diastology LVIDs:         3.00 cm      LV e' medial:    10.80 cm/s LV PW:          1.10 cm      LV E/e' medial:  12.0 LV IVS:        1.20 cm      LV e' lateral:   15.20 cm/s LVOT diam:     2.10 cm      LV E/e' lateral: 8.6 LV SV:         70 LV SV Index:   30 LVOT Area:     3.46 cm  LV Volumes (MOD) LV vol d, MOD A2C: 112.0 ml LV vol d, MOD A4C: 96.0 ml LV vol s, MOD A2C: 53.6 ml LV vol s, MOD A4C: 46.9 ml LV SV MOD A2C:     58.4 ml LV SV MOD A4C:     96.0 ml LV SV MOD BP:      54.3 ml RIGHT VENTRICLE             IVC RV Basal diam:  4.20 cm     IVC diam: 2.50 cm RV S prime:     10.30 cm/s TAPSE (M-mode): 1.7 cm LEFT ATRIUM             Index        RIGHT ATRIUM           Index LA diam:        3.80 cm 1.65 cm/m   RA Area:     19.60 cm LA Vol (A2C):   65.1 ml 28.27 ml/m  RA Volume:   52.10 ml  22.62 ml/m LA Vol (A4C):   57.5 ml 24.97 ml/m LA Biplane Vol: 62.1 ml 26.96 ml/m  AORTIC VALVE AV Area (Vmax):    2.21 cm AV Area (Vmean):   2.16 cm AV Area (VTI):     2.38 cm AV Vmax:           154.00 cm/s AV Vmean:          114.000 cm/s AV VTI:            0.292 m AV Peak Grad:      9.5 mmHg AV Mean Grad:      6.0 mmHg LVOT Vmax:         98.30 cm/s LVOT Vmean:        71.100 cm/s LVOT VTI:          0.201 m LVOT/AV VTI ratio: 0.69  AORTA Ao Root diam: 2.90 cm Ao Asc diam:  3.00 cm MITRAL VALVE                TRICUSPID VALVE MV Area (PHT): 5.16 cm     TR Peak grad:   17.3 mmHg MV Decel Time: 147 msec     TR Vmax:        208.00 cm/s MV E velocity: 130.00 cm/s  SHUNTS                             Systemic VTI:  0.20 m                             Systemic Diam: 2.10 cm Glori Bickers MD Electronically signed by Glori Bickers MD Signature Date/Time: 11/14/2022/4:01:22 PM    Final        Hosie Poisson M.D. [facility] 11/16/2022, 1:01 PM  Available via Epic secure chat 7am-7pm After 7 pm, please refer to night coverage provider listed on amion.

## 2022-11-16 NOTE — Progress Notes (Signed)
Heart Failure Nurse Navigator Progress Note  PCP: Azzie Glatter, FNP [facility]) PCP-Cardiologist: Edmonia James., MD (NEW) Admission Diagnosis: A flutter RVR Admitted from: home alone  Presentation:   Debbie Lee presented 2/18 with increased SOB. Noted to be in atrial flutter with RVR. History of SVT with adenosine use, no cardioology follow up after appt, ran out of cardizem, no anticoag at home.   Spoke with patient over phone as she is hospitalized at [facility]. Pt verified name and DOB before initiating interview. Very pleasant, states she has ambulated in the halls a bit--has rollator at home. Works for [employer] at [employer], sits most of day. Currently on supplemental oxygen at hospital. Had EGD yesterday, holding on colonscopy. Plan for TEE DCCV Friday 2/23.  Lives in apartment, alone. Has independent adult daughter nearby. States she has gone without food in the past 12 months due to finances. Currently has food at home. Has some recent concerns regarding housing-currently caught up on rent and utilities, but has not been able to pay on-time several times this past year due to financial concerns. Has working, reliable vehicle. Endorses quit smoking in 2004, smoked for 16 years, 1PPD avg. No alcohol or illicit drug use.  States she has had a difficult time affording "important" medications and has also ran out of her prescription for cardizem.  Pt agreeable to [facility] clinic appointment upon hospital DC.   ECHO/ LVEF: 30-35% (? Tachy mediated per cards note), LV mod decreased, LV global hypokinesis. RV mod reduced.  Clinical Course:  Past Medical History:  Diagnosis Date   Anemia    Arthritis    Depression    Hypertension    Panic attack      Social History   Socioeconomic History   Marital status: Divorced    Spouse name: Not on file   Number of children: 1   Years of education: Not on file   Highest education level: Associate degree: occupational, Hotel manager, or vocational  program  Occupational History   Not on file  Tobacco Use   Smoking status: Former    Years: 22.00    Types: Cigarettes   Smokeless tobacco: Never  Vaping Use   Vaping Use: Never used  Substance and Sexual Activity   Alcohol use: Not Currently    Comment: 4/year   Drug use: No   Sexual activity: Not Currently    Birth control/protection: Implant  Other Topics Concern   Not on file  Social History Narrative   Not on file   Social Determinants of Health   Financial Resource Strain: High Risk (11/16/2022)   Overall Financial Resource Strain (CARDIA)    Difficulty of Paying Living Expenses: Hard  Food Insecurity: Food Insecurity Present (11/16/2022)   Hunger Vital Sign    Worried About [place] in the Last Year: Sometimes true    Ran Out of Food in the Last Year: Sometimes true  Transportation Needs: No Transportation Needs (11/16/2022)   PRAPARE - Hydrologist (Medical): No    Lack of Transportation (Non-Medical): No  Recent Concern: Transportation Needs - Unmet Transportation Needs (11/14/2022)   PRAPARE - Hydrologist (Medical): Yes    Lack of Transportation (Non-Medical): No  Physical Activity: Not on file  Stress: Not on file  Social Connections: Not on file    High Risk Criteria for Readmission and/or Poor Patient Outcomes: Heart failure hospital admissions (last 6 months): 1  No Show rate: NA Difficult social situation: yes Demonstrates medication adherence: NO Primary Language: English Literacy level: Able to read/write and comprehend. Wears prescription glasses.   Barriers of Care:   -financial concerns.  -medication cost -cardiology follow up -intermittent food insecurities -concerns regarding housing.   Considerations/Referrals:   Referral made to Heart Failure Pharmacist Stewardship: yes, appreciated Referral made to Heart Failure CSW/NCM TOC: pending. Pt declined at this time.   Referral made to Heart & Vascular TOC clinic: yes, 3/12 @ 11AM (next available)  Items for Follow-up on DC/TOC: -optimize -medication cost -medication complaince -cardiology plan/follow up -social concerns   Pricilla Holm, MSN, RN Heart Failure Nurse Navigator

## 2022-11-16 NOTE — Progress Notes (Signed)
Rounding Note    Patient Name: Debbie Lee Date of Encounter: 11/16/2022  [place] Cardiologist: Jenkins Rouge, MD   Subjective   Denies any CP or SOB.   Inpatient Medications    Scheduled Meds:  apixaban  5 mg Oral BID   carvedilol  6.25 mg Oral BID WC   DULoxetine  60 mg Oral Daily   furosemide  20 mg Intravenous Daily   insulin aspart  0-9 Units Subcutaneous Q4H   [START ON 11/17/2022] metFORMIN  500 mg Oral BID WC   pantoprazole (PROTONIX) IV  40 mg Intravenous Q12H   pneumococcal 20-valent conjugate vaccine  0.5 mL Intramuscular Tomorrow-1000   sacubitril-valsartan  1 tablet Oral BID   Continuous Infusions:  diltiazem (CARDIZEM) infusion 15 mg/hr (11/16/22 0937)   PRN Meds: acetaminophen **OR** acetaminophen, HYDROcodone-acetaminophen   Vital Signs    Vitals:   11/16/22 0600 11/16/22 0609 11/16/22 0800 11/16/22 0928  BP: 130/75 117/72 124/74 133/72  Pulse: 78 97 (!) 48 (!) 102  Resp: (!) 22 (!) 22 (!) 22   Temp:  98.1 F (36.7 C)    TempSrc:  Oral    SpO2: (!) 87% 95% 98%   Weight:      Height:        Intake/Output Summary (Last 24 hours) at 11/16/2022 0942 Last data filed at 11/16/2022 0750 Gross per 24 hour  Intake 1271.41 ml  Output 6950 ml  Net -5678.59 ml      11/16/2022    5:00 AM 11/15/2022   12:03 PM 11/15/2022    3:13 AM  Last 3 Weights  Weight (lbs) 330 lb 1.6 oz 342 lb 2.5 oz 342 lb 1.6 oz  Weight (kg) 149.732 kg 155.2 kg 155.176 kg      Telemetry    Atrial flutter with RVR - Personally Reviewed  ECG    2:1 atrial flutter - Personally Reviewed  Physical Exam   GEN: No acute distress.   Neck: No JVD Cardiac: tachycardic, irregular, no murmurs, rubs, or gallops.  Respiratory: Clear to auscultation bilaterally. GI: Soft, nontender, non-distended  MS: No edema; No deformity. Neuro:  Nonfocal  Psych: Normal affect   Labs    High Sensitivity Troponin:   Recent Labs  Lab 11/13/22 1758 11/13/22 2035   TROPONINIHS 4 5     Chemistry Recent Labs  Lab 11/13/22 1758 11/14/22 0248 11/14/22 0533 11/16/22 0456  NA 138  --  134* 136  K 4.1  --  3.8 3.8  CL 103  --  100 96*  CO2 25  --  25 30  GLUCOSE 192*  --  176* 181*  BUN 15  --  14 12  CREATININE 1.01*  --  0.81 0.81  CALCIUM 8.6*  --  8.0* 8.3*  MG 1.9  --  1.9 2.2  PROT  --  6.7 6.6 6.6  ALBUMIN  --  3.0* 3.1* 3.2*  AST  --  28 33 24  ALT  --  29 29 25  $ ALKPHOS  --  69 71 70  BILITOT  --  1.3* 1.4* 1.4*  GFRNONAA >60  --  >60 >60  ANIONGAP 10  --  9 10    Lipids No results for input(s): "CHOL", "TRIG", "HDL", "LABVLDL", "LDLCALC", "CHOLHDL" in the last 168 hours.  Hematology Recent Labs  Lab 11/14/22 0919 11/15/22 0014 11/16/22 0456  WBC 11.2* 12.1* 10.9*  RBC 3.90 3.86* 4.26  HGB 8.0* 7.9* 8.6*  HCT 29.4*  29.2* 32.0*  MCV 75.4* 75.6* 75.1*  MCH 20.5* 20.5* 20.2*  MCHC 27.2* 27.1* 26.9*  RDW 19.3* 19.3* 19.4*  PLT 286 292 309   Thyroid  Recent Labs  Lab 11/13/22 1758  TSH 6.845*  FREET4 0.89    BNP Recent Labs  Lab 11/13/22 1758  BNP 228.6*    DDimer  Recent Labs  Lab 11/13/22 1829  DDIMER 1.21*     Radiology    VAS [place] LOWER EXTREMITY VENOUS (DVT)  Result Date: 11/14/2022  Lower Venous DVT Study Patient Name:  Debbie Lee  Date of Exam:   11/14/2022 Medical Rec #: [id number]    Accession #:    [id number] Date of Birth: 12-26-1970    Patient Gender: F Patient Age:   52 years Exam Location:  [facility] Procedure:      VAS [place] LOWER EXTREMITY VENOUS (DVT) Referring Phys: Nyoka Lint DOUTOVA --------------------------------------------------------------------------------  Indications: D-dimer 1.2.  Anticoagulation: Heparin. Limitations: Body habitus. Comparison Study: No prior studies. Performing Technologist: Darlin Coco RDMS, RVT  Examination Guidelines: A complete evaluation includes B-mode imaging, spectral Doppler, color Doppler, and power Doppler as needed of all accessible portions of  each vessel. Bilateral testing is considered an integral part of a complete examination. Limited examinations for reoccurring indications may be performed as noted. The reflux portion of the exam is performed with the patient in reverse Trendelenburg.  +---------+---------------+---------+-----------+----------+-------------------+ RIGHT    CompressibilityPhasicitySpontaneityPropertiesThrombus Aging      +---------+---------------+---------+-----------+----------+-------------------+ CFV      Full           Yes      Yes                                      +---------+---------------+---------+-----------+----------+-------------------+ SFJ      Full                                                             +---------+---------------+---------+-----------+----------+-------------------+ FV Prox  Full                                                             +---------+---------------+---------+-----------+----------+-------------------+ FV Mid   Full                                                             +---------+---------------+---------+-----------+----------+-------------------+ FV DistalFull                                                             +---------+---------------+---------+-----------+----------+-------------------+ PFV      Full                                                             +---------+---------------+---------+-----------+----------+-------------------+  POP      Full           Yes      Yes                                      +---------+---------------+---------+-----------+----------+-------------------+ PTV      Full                                                             +---------+---------------+---------+-----------+----------+-------------------+ PERO                                                  Not well visualized  +---------+---------------+---------+-----------+----------+-------------------+   +---------+---------------+---------+-----------+----------+--------------+ LEFT     CompressibilityPhasicitySpontaneityPropertiesThrombus Aging +---------+---------------+---------+-----------+----------+--------------+ CFV      Full           Yes      Yes                                 +---------+---------------+---------+-----------+----------+--------------+ SFJ      Full                                                        +---------+---------------+---------+-----------+----------+--------------+ FV Prox  Full                                                        +---------+---------------+---------+-----------+----------+--------------+ FV Mid   Full                                                        +---------+---------------+---------+-----------+----------+--------------+ FV DistalFull                                                        +---------+---------------+---------+-----------+----------+--------------+ PFV      Full                                                        +---------+---------------+---------+-----------+----------+--------------+ POP      Full           Yes      Yes                                 +---------+---------------+---------+-----------+----------+--------------+  PTV      Full                                                        +---------+---------------+---------+-----------+----------+--------------+ PERO     Full                                                        +---------+---------------+---------+-----------+----------+--------------+     Summary: RIGHT: - There is no evidence of deep vein thrombosis in the lower extremity. However, portions of this examination were limited- see technologist comments above.  - No cystic structure found in the popliteal fossa.  LEFT: - There is no evidence of deep  vein thrombosis in the lower extremity.  - No cystic structure found in the popliteal fossa.  *See table(s) above for measurements and observations. Electronically signed by Servando Snare MD on 11/14/2022 at 4:27:56 PM.    Final    ECHOCARDIOGRAM COMPLETE  Result Date: 11/14/2022    ECHOCARDIOGRAM REPORT   Patient Name:   Debbie Lee Date of Exam: 11/14/2022 Medical Rec #:  [id number]   Height:       62.0 in Accession #:    [id number]  Weight:       310.0 lb Date of Birth:  1971-06-01   BSA:          2.303 m Patient Age:    69 years    BP:           122/72 mmHg Patient Gender: F           HR:           74 bpm. Exam Location:  Inpatient Procedure: 2D Echo, Color Doppler and Cardiac Doppler Indications:    I48.92* Unspecified atrial flutter  History:        Patient has no prior history of Echocardiogram examinations.                 Risk Factors:Hypertension.  Sonographer:    Phineas Douglas Referring Phys: [id number] [place]  1. Left ventricular ejection fraction, by estimation, is 30 to 35%. The left ventricle has moderately decreased function. The left ventricle demonstrates global hypokinesis. Left ventricular diastolic function could not be evaluated.  2. Right ventricular systolic function is moderately reduced. The right ventricular size is normal. There is normal pulmonary artery systolic pressure.  3. Left atrial size was mildly dilated.  4. Right atrial size was mildly dilated.  5. The mitral valve is normal in structure. No evidence of mitral valve regurgitation. No evidence of mitral stenosis.  6. The aortic valve is tricuspid. Aortic valve regurgitation is not visualized. No aortic stenosis is present.  7. The inferior vena cava is dilated in size with >50% respiratory variability, suggesting right atrial pressure of 8 mmHg. FINDINGS  Left Ventricle: Left ventricular ejection fraction, by estimation, is 30 to 35%. The left ventricle has moderately decreased function. The left ventricle  demonstrates global hypokinesis. The left ventricular internal cavity size was normal in size. There is no left ventricular hypertrophy. Left ventricular diastolic function could not be evaluated due to atrial fibrillation. Left ventricular diastolic function could  not be evaluated. Right Ventricle: The right ventricular size is normal. No increase in right ventricular wall thickness. Right ventricular systolic function is moderately reduced. There is normal pulmonary artery systolic pressure. The tricuspid regurgitant velocity is 2.08 m/s, and with an assumed right atrial pressure of 15 mmHg, the estimated right ventricular systolic pressure is [id number] mmHg. Left Atrium: Left atrial size was mildly dilated. Right Atrium: Right atrial size was mildly dilated. Pericardium: There is no evidence of pericardial effusion. Mitral Valve: The mitral valve is normal in structure. No evidence of mitral valve regurgitation. No evidence of mitral valve stenosis. Tricuspid Valve: The tricuspid valve is normal in structure. Tricuspid valve regurgitation is mild . No evidence of tricuspid stenosis. Aortic Valve: The aortic valve is tricuspid. Aortic valve regurgitation is not visualized. No aortic stenosis is present. Aortic valve mean gradient measures 6.0 mmHg. Aortic valve peak gradient measures 9.5 mmHg. Aortic valve area, by VTI measures 2.38 cm. Pulmonic Valve: The pulmonic valve was normal in structure. Pulmonic valve regurgitation is not visualized. No evidence of pulmonic stenosis. Aorta: The aortic root is normal in size and structure. Venous: The inferior vena cava is dilated in size with greater than 50% respiratory variability, suggesting right atrial pressure of 8 mmHg. IAS/Shunts: No atrial level shunt detected by color flow Doppler.  LEFT VENTRICLE PLAX 2D LVIDd:         4.00 cm      Diastology LVIDs:         3.00 cm      LV e' medial:    10.80 cm/s LV PW:         1.10 cm      LV E/e' medial:  12.0 LV IVS:         1.20 cm      LV e' lateral:   15.20 cm/s LVOT diam:     2.10 cm      LV E/e' lateral: 8.6 LV SV:         70 LV SV Index:   30 LVOT Area:     3.46 cm  LV Volumes (MOD) LV vol d, MOD A2C: 112.0 ml LV vol d, MOD A4C: 96.0 ml LV vol s, MOD A2C: 53.6 ml LV vol s, MOD A4C: 46.9 ml LV SV MOD A2C:     58.4 ml LV SV MOD A4C:     96.0 ml LV SV MOD BP:      54.3 ml RIGHT VENTRICLE             IVC RV Basal diam:  4.20 cm     IVC diam: 2.50 cm RV S prime:     10.30 cm/s TAPSE (M-mode): 1.7 cm LEFT ATRIUM             Index        RIGHT ATRIUM           Index LA diam:        3.80 cm 1.65 cm/m   RA Area:     19.60 cm LA Vol (A2C):   65.1 ml 28.27 ml/m  RA Volume:   52.10 ml  22.62 ml/m LA Vol (A4C):   57.5 ml 24.97 ml/m LA Biplane Vol: 62.1 ml 26.96 ml/m  AORTIC VALVE AV Area (Vmax):    2.21 cm AV Area (Vmean):   2.16 cm AV Area (VTI):     2.38 cm AV Vmax:           154.00 cm/s AV Vmean:  114.000 cm/s AV VTI:            0.292 m AV Peak Grad:      9.5 mmHg AV Mean Grad:      6.0 mmHg LVOT Vmax:         98.30 cm/s LVOT Vmean:        71.100 cm/s LVOT VTI:          0.201 m LVOT/AV VTI ratio: 0.69  AORTA Ao Root diam: 2.90 cm Ao Asc diam:  3.00 cm MITRAL VALVE                TRICUSPID VALVE MV Area (PHT): 5.16 cm     TR Peak grad:   17.3 mmHg MV Decel Time: 147 msec     TR Vmax:        208.00 cm/s MV E velocity: 130.00 cm/s                             SHUNTS                             Systemic VTI:  0.20 m                             Systemic Diam: 2.10 cm Glori Bickers MD Electronically signed by Glori Bickers MD Signature Date/Time: 11/14/2022/4:01:22 PM    Final     Cardiac Studies   Echo 11/14/2022 1. Left ventricular ejection fraction, by estimation, is 30 to 35%. The  left ventricle has moderately decreased function. The left ventricle  demonstrates global hypokinesis. Left ventricular diastolic function could  not be evaluated.   2. Right ventricular systolic function is moderately reduced. The  right  ventricular size is normal. There is normal pulmonary artery systolic  pressure.   3. Left atrial size was mildly dilated.   4. Right atrial size was mildly dilated.   5. The mitral valve is normal in structure. No evidence of mitral valve  regurgitation. No evidence of mitral stenosis.   6. The aortic valve is tricuspid. Aortic valve regurgitation is not  visualized. No aortic stenosis is present.   7. The inferior vena cava is dilated in size with >50% respiratory  variability, suggesting right atrial pressure of 8 mmHg.   Patient Profile     52 y.o. female with PMH of SVT, hypertension and newly diagnosed DM2 presented with atrial flutter. Also found to have anemia, underwent EGD on 2/20. EF 30-35%   Assessment & Plan    Atrial flutter with RVR  - still tachycardic, continue coreg, go up on IV cardizem to 15. Started on eliquis in the afternoon of 2/20, currently pending TEE DCCV on 2/23.   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 Coreg, entresto, plan to add spironolactone prior to DC. Despite decompensation during EGD yesterday, she appears to be euvolemic.   HTN: coreg and entresto. Plan to add spironolactone prior to DC  H/o SVT: occurred in 2022, converted on adenosine at the time  Newly diagnosed DM II: managed by primary team  Anemia: underwent EGD yesterday, decompensated during the study. Holding off on colonoscopy for now.  OSA: likely contributed to the decompensation      For questions or updates, please contact [place] Please consult [email or web address] for contact info under        Signed,  [place], [place]  11/16/2022, 9:42 AM

## 2022-11-17 DIAGNOSIS — D649 Anemia, unspecified: Secondary | ICD-10-CM | POA: Diagnosis not present

## 2022-11-17 DIAGNOSIS — I483 Typical atrial flutter: Secondary | ICD-10-CM | POA: Diagnosis not present

## 2022-11-17 DIAGNOSIS — I4892 Unspecified atrial flutter: Secondary | ICD-10-CM | POA: Diagnosis not present

## 2022-11-17 DIAGNOSIS — E119 Type 2 diabetes mellitus without complications: Secondary | ICD-10-CM | POA: Diagnosis not present

## 2022-11-17 DIAGNOSIS — I10 Essential (primary) hypertension: Secondary | ICD-10-CM | POA: Diagnosis not present

## 2022-11-17 LAB — CBC
HCT: 36.1 % (ref 36.0–46.0)
Hemoglobin: 9.8 g/dL — ABNORMAL LOW (ref 12.0–15.0)
MCH: 20.4 pg — ABNORMAL LOW (ref 26.0–34.0)
MCHC: 27.1 g/dL — ABNORMAL LOW (ref 30.0–36.0)
MCV: 75.1 fL — ABNORMAL LOW (ref 80.0–100.0)
Platelets: 330 10*3/uL (ref 150–400)
RBC: 4.81 MIL/uL (ref 3.87–5.11)
RDW: 19.4 % — ABNORMAL HIGH (ref 11.5–15.5)
WBC: 10 10*3/uL (ref 4.0–10.5)
nRBC: 0 % (ref 0.0–0.2)

## 2022-11-17 LAB — COMPREHENSIVE METABOLIC PANEL
ALT: 27 U/L (ref 0–44)
AST: 39 U/L (ref 15–41)
Albumin: 3.3 g/dL — ABNORMAL LOW (ref 3.5–5.0)
Alkaline Phosphatase: 94 U/L (ref 38–126)
Anion gap: 11 (ref 5–15)
BUN: 13 mg/dL (ref 6–20)
CO2: 29 mmol/L (ref 22–32)
Calcium: 8.5 mg/dL — ABNORMAL LOW (ref 8.9–10.3)
Chloride: 97 mmol/L — ABNORMAL LOW (ref 98–111)
Creatinine, Ser: 0.86 mg/dL (ref 0.44–1.00)
GFR, Estimated: >60 mL/min (ref >60)
Glucose, Bld: 239 mg/dL — ABNORMAL HIGH (ref 70–99)
Potassium: 3.9 mmol/L (ref 3.5–5.1)
Sodium: 137 mmol/L (ref 135–145)
Total Bilirubin: 1 mg/dL (ref 0.3–1.2)
Total Protein: 7.2 g/dL (ref 6.5–8.1)

## 2022-11-17 LAB — GLUCOSE, CAPILLARY
Glucose-Capillary: 179 mg/dL — ABNORMAL HIGH (ref 70–99)
Glucose-Capillary: 229 mg/dL — ABNORMAL HIGH (ref 70–99)
Glucose-Capillary: 231 mg/dL — ABNORMAL HIGH (ref 70–99)
Glucose-Capillary: 264 mg/dL — ABNORMAL HIGH (ref 70–99)
Glucose-Capillary: 267 mg/dL — ABNORMAL HIGH (ref 70–99)
Glucose-Capillary: 279 mg/dL — ABNORMAL HIGH (ref 70–99)

## 2022-11-17 LAB — OCCULT BLOOD X 1 CARD TO LAB, STOOL: Fecal Occult Bld: NEGATIVE

## 2022-11-17 LAB — MAGNESIUM: Magnesium: 2.6 mg/dL — ABNORMAL HIGH (ref 1.7–2.4)

## 2022-11-17 MED ORDER — INSULIN GLARGINE-YFGN 100 UNIT/ML ~~LOC~~ SOLN
10.0000 [IU] | Freq: Every day | SUBCUTANEOUS | Status: DC
  Administered 2022-11-17 – 2022-11-20 (×4): 10 [IU] via SUBCUTANEOUS
  Filled 2022-11-17 (×4): qty 0.1

## 2022-11-17 MED ORDER — ORAL CARE MOUTH RINSE: 15.0000 mL | OROMUCOSAL | Status: DC | PRN

## 2022-11-17 MED ORDER — INSULIN ASPART 100 UNIT/ML IJ SOLN
0.0000 [IU] | Freq: Three times a day (TID) | INTRAMUSCULAR | Status: DC
  Administered 2022-11-17 – 2022-11-18 (×2): 2 [IU] via SUBCUTANEOUS
  Administered 2022-11-19: 3 [IU] via SUBCUTANEOUS
  Administered 2022-11-19 – 2022-11-20 (×3): 2 [IU] via SUBCUTANEOUS
  Administered 2022-11-20: 3 [IU] via SUBCUTANEOUS
  Administered 2022-11-20: 2 [IU] via SUBCUTANEOUS

## 2022-11-17 NOTE — Progress Notes (Signed)
Rounding Note    Patient Name: Debbie Lee Date of Encounter: 11/17/2022  [place] Cardiologist: Jenkins Rouge, MD   Subjective   No dyspnea flutter rate better controlled   Inpatient Medications    Scheduled Meds:  apixaban  5 mg Oral BID   carvedilol  6.25 mg Oral BID WC   DULoxetine  60 mg Oral Daily   furosemide  20 mg Intravenous Daily   insulin aspart  0-9 Units Subcutaneous Q4H   metFORMIN  500 mg Oral BID WC   pantoprazole  40 mg Oral Q0600   pneumococcal 20-valent conjugate vaccine  0.5 mL Intramuscular Tomorrow-1000   sacubitril-valsartan  1 tablet Oral BID   Continuous Infusions:  diltiazem (CARDIZEM) infusion 15 mg/hr (11/17/22 0417)   PRN Meds: acetaminophen **OR** acetaminophen, HYDROcodone-acetaminophen, mouth rinse   Vital Signs    Vitals:   11/17/22 0400 11/17/22 0421 11/17/22 0428 11/17/22 0833  BP: 134/78 120/80  132/84  Pulse: 65 80    Resp:  20    Temp:  97.7 F (36.5 C)    TempSrc:  Oral    SpO2: 98% 93%    Weight:   (!) 149.1 kg   Height:        Intake/Output Summary (Last 24 hours) at 11/17/2022 0855 Last data filed at 11/17/2022 0820 Gross per 24 hour  Intake 1002.1 ml  Output 3450 ml  Net -2447.9 ml      11/17/2022    4:28 AM 11/16/2022    5:00 AM 11/15/2022   12:03 PM  Last 3 Weights  Weight (lbs) 328 lb 12.8 oz 330 lb 1.6 oz 342 lb 2.5 oz  Weight (kg) 149.143 kg 149.732 kg 155.2 kg      Telemetry    Atrial flutter with RVR - Personally Reviewed  ECG    3:1 atrial flutter - Personally Reviewed  Physical Exam   GEN: No acute distress.   Neck: No JVD Cardiac: tachycardic, irregular, no murmurs, rubs, or gallops.  Respiratory: Clear to auscultation bilaterally. GI: Soft, nontender, non-distended  MS: No edema; No deformity. Neuro:  Nonfocal  Psych: Normal affect   Labs    High Sensitivity Troponin:   Recent Labs  Lab 11/13/22 1758 11/13/22 2035  TROPONINIHS 4 5     Chemistry Recent Labs   Lab 11/14/22 0533 11/16/22 0456 11/17/22 0514  NA 134* 136 137  K 3.8 3.8 3.9  CL 100 96* 97*  CO2 25 30 29  $ GLUCOSE 176* 181* 239*  BUN 14 12 13  $ CREATININE 0.81 0.81 0.86  CALCIUM 8.0* 8.3* 8.5*  MG 1.9 2.2 2.6*  PROT 6.6 6.6 7.2  ALBUMIN 3.1* 3.2* 3.3*  AST 33 24 39  ALT 29 25 27  $ ALKPHOS 71 70 94  BILITOT 1.4* 1.4* 1.0  GFRNONAA >60 >60 >60  ANIONGAP 9 10 11    $ Lipids No results for input(s): "CHOL", "TRIG", "HDL", "LABVLDL", "LDLCALC", "CHOLHDL" in the last 168 hours.  Hematology Recent Labs  Lab 11/15/22 0014 11/16/22 0456 11/17/22 0514  WBC 12.1* 10.9* 10.0  RBC 3.86* 4.26 4.81  HGB 7.9* 8.6* 9.8*  HCT 29.2* 32.0* 36.1  MCV 75.6* 75.1* 75.1*  MCH 20.5* 20.2* 20.4*  MCHC 27.1* 26.9* 27.1*  RDW 19.3* 19.4* 19.4*  PLT 292 309 330   Thyroid  Recent Labs  Lab 11/13/22 1758  TSH 6.845*  FREET4 0.89    BNP Recent Labs  Lab 11/13/22 1758  BNP 228.6*  DDimer  Recent Labs  Lab 11/13/22 1829  DDIMER 1.21*     Radiology    DG Chest 2 View  Result Date: 11/16/2022 CLINICAL DATA:  Pleural effusion. EXAM: CHEST - 2 VIEW COMPARISON:  CXR 11/13/22 FINDINGS: No pleural effusion is visualized. No pneumothorax. No focal airspace opacity. Unchanged prominent cardiac contours. Visualized upper abdomen is unremarkable. No radiographically apparent displaced rib fractures. IMPRESSION: No pleural effusion.  No focal airspace opacity. Electronically Signed   By: Marin Roberts M.D.   On: 11/16/2022 14:24    Cardiac Studies   Echo 11/14/2022 1. Left ventricular ejection fraction, by estimation, is 30 to 35%. The  left ventricle has moderately decreased function. The left ventricle  demonstrates global hypokinesis. Left ventricular diastolic function could  not be evaluated.   2. Right ventricular systolic function is moderately reduced. The right  ventricular size is normal. There is normal pulmonary artery systolic  pressure.   3. Left atrial size was mildly  dilated.   4. Right atrial size was mildly dilated.   5. The mitral valve is normal in structure. No evidence of mitral valve  regurgitation. No evidence of mitral stenosis.   6. The aortic valve is tricuspid. Aortic valve regurgitation is not  visualized. No aortic stenosis is present.   7. The inferior vena cava is dilated in size with >50% respiratory  variability, suggesting right atrial pressure of 8 mmHg.   Patient Profile     52 y.o. female with PMH of SVT, hypertension and newly diagnosed DM2 presented with atrial flutter. Also found to have anemia, underwent EGD on 2/20. EF 30-35%   Assessment & Plan    Atrial flutter with RVR  - rate improved on coreg/cardizem TEE DCCV on 2/23.   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 Coreg, entresto, plan to add spironolactone prior to DC. Despite decompensation during EGD  she appears to be euvolemic. Suspect issue was obesity and OSA   HTN: coreg and entresto. Plan to add spironolactone prior to DC  H/o SVT: occurred in 2022, converted on adenosine at the time  Newly diagnosed DM II: managed by primary team  Anemia: underwent EGD no ulcer or bleeding source , decompensated during the study. Holding off on colonoscopy for now.  OSA: likely contributed to the decompensation      For questions or updates, please contact [place] Please consult [email or web address] for contact info under        Signed, Jenkins Rouge, MD  11/17/2022, 8:55 AM

## 2022-11-17 NOTE — Progress Notes (Signed)
[facility]                                                                               Debbie Lee, is a 52 y.o. 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 Came to [facility] ED 2/18 to 2:1 flutter vagal maneuver unsuccessful . She was found to be in atrial flutter. Cardiology consulted and shew as started on IV cardizem gtt.   Assessment & Plan    Assessment and Plan:  Atrial flutter  Rate not well controlled. Its in 150/min on activity.  Cardiology consulted and plan for TEE cardioversion on Friday.  She is currently on cardizem gtt.  Echocardiogram showed 30 to 35%. The  left ventricle has moderately decreased function. The left ventricle  demonstrates global hypokinesis. Left ventricular diastolic function could  not be evaluated.  Started her on coreg.  Continue to monitor.    Acute systolic heart failure:  Euvolemic.    Iron deficiency anemia:  Microcytic anemia.  No plans for colonoscopy inpatient  per GI. Outpatient colonoscopy due to multiple co morbidities.  S/p EGD on 11/15/2022, no bleeding seen. EGD wnl.  Continue with PPI daily.    OSA on CPAP;     Hypertension;  BP parameters are optimal.     H/o SVT: Converted by  adenosine.    Type 2 DM: uncontrolled with hyperglycemia.  - Hemoglobin A1c is 9.3%. CBG (last 3)  Recent Labs    11/17/22 0400 11/17/22 0751 11/17/22 1143  GLUCAP 231* 229* 279*   Not well controlled. Started her on semglee 10 units daily. Hemoglobin A1c is 9.3%.  Resume SSI.   Depression  Resume cymbalta .  Body mass index is 60.14 kg/m. Morbid obesity. Outpatient follow up with PCP.    Estimated body mass index is 60.14  kg/m as calculated from the following:   Height as of this encounter: 5' 2"$  (1.575 m).   Weight as of this encounter: 149.1 kg.  Code Status: full code.  DVT Prophylaxis:   apixaban (ELIQUIS) tablet 5 mg   Level of Care: Level of care: Progressive Family Communication: none at bedside.   Disposition Plan:     pending TEE/ Cardioversion.   Procedures:  Echocardiogram.  TEE and cardioversion scheduled on 2/23/   Consultants:   Cardiology Gastroenterology.   Antimicrobials:   Anti-infectives (From admission, onward)    None        Medications  Scheduled Meds:  apixaban  5 mg Oral BID   carvedilol  6.25 mg Oral BID WC   DULoxetine  60 mg Oral Daily   furosemide  20 mg Intravenous Daily   insulin aspart  0-9 Units Subcutaneous Q4H   metFORMIN  500 mg Oral BID WC   pantoprazole  40 mg Oral Q0600   pneumococcal 20-valent conjugate vaccine  0.5 mL Intramuscular  Tomorrow-1000   sacubitril-valsartan  1 tablet Oral BID   Continuous Infusions:  diltiazem (CARDIZEM) infusion 15 mg/hr (11/17/22 1223)   PRN Meds:.acetaminophen **OR** acetaminophen, HYDROcodone-acetaminophen, mouth rinse    Subjective:   Debbie Lee was seen and examined today.  No new complaints today.   Objective:   Vitals:   11/17/22 0421 11/17/22 0428 11/17/22 0833 11/17/22 1146  BP: 120/80  132/84 134/63  Pulse: 80   (!) 106  Resp: 20     Temp: 97.7 F (36.5 C)   97.8 F (36.6 C)  TempSrc: Oral   Oral  SpO2: 93%   98%  Weight:  (!) 149.1 kg    Height:        Intake/Output Summary (Last 24 hours) at 11/17/2022 1552 Last data filed at 11/17/2022 1522 Gross per 24 hour  Intake 1017.79 ml  Output 3600 ml  Net -2582.21 ml    Filed Weights   11/15/22 1203 11/16/22 0500 11/17/22 0428  Weight: (!) 155.2 kg (!) 149.7 kg (!) 149.1 kg     Exam General exam: Appears calm and comfortable  Respiratory system: Clear to auscultation. Respiratory effort normal. Cardiovascular system: S1 & S2  heard, tachycardic, irregularly irregular.  Gastrointestinal system: Abdomen is nondistended, soft and nontender.  Central nervous system: Alert and oriented. No focal neurological deficits. Extremities: Symmetric 5 x 5 power. Skin: No rashes,  Psychiatry:  Mood & affect appropriate.     Data Reviewed:  I have personally reviewed following labs and imaging studies   CBC Lab Results  Component Value Date   WBC 10.0 11/17/2022   RBC 4.81 11/17/2022   HGB 9.8 (L) 11/17/2022   HCT 36.1 11/17/2022   MCV 75.1 (L) 11/17/2022   MCH 20.4 (L) 11/17/2022   PLT 330 11/17/2022   MCHC 27.1 (L) 11/17/2022   RDW 19.4 (H) 11/17/2022   LYMPHSABS 2.0 11/13/2022   MONOABS 0.5 11/13/2022   EOSABS 0.2 11/13/2022   BASOSABS 0.0 123456     Last metabolic panel Lab Results  Component Value Date   NA 137 11/17/2022   K 3.9 11/17/2022   CL 97 (L) 11/17/2022   CO2 29 11/17/2022   BUN 13 11/17/2022   CREATININE 0.86 11/17/2022   GLUCOSE 239 (H) 11/17/2022   GFRNONAA >60 11/17/2022   GFRAA >60 10/29/2019   CALCIUM 8.5 (L) 11/17/2022   PHOS 3.4 11/14/2022   PROT 7.2 11/17/2022   ALBUMIN 3.3 (L) 11/17/2022   BILITOT 1.0 11/17/2022   ALKPHOS 94 11/17/2022   AST 39 11/17/2022   ALT 27 11/17/2022   ANIONGAP 11 11/17/2022    CBG (last 3)  Recent Labs    11/17/22 0400 11/17/22 0751 11/17/22 1143  GLUCAP 231* 229* 279*       Coagulation Profile: Recent Labs  Lab 11/13/22 2055  INR 1.4*      Radiology Studies: DG Chest 2 View  Result Date: 11/16/2022 CLINICAL DATA:  Pleural effusion. EXAM: CHEST - 2 VIEW COMPARISON:  CXR 11/13/22 FINDINGS: No pleural effusion is visualized. No pneumothorax. No focal airspace opacity. Unchanged prominent cardiac contours. Visualized upper abdomen is unremarkable. No radiographically apparent displaced rib fractures. IMPRESSION: No pleural effusion.  No focal airspace opacity. Electronically Signed   By: Marin Roberts M.D.   On: 11/16/2022  14:24       Hosie Poisson M.D. [facility] 11/17/2022, 3:52 PM  Available via Epic secure chat 7am-7pm After 7 pm, please refer to night coverage provider listed on amion.

## 2022-11-17 NOTE — Plan of Care (Signed)
  Problem: Tissue Perfusion: Goal: Adequacy of tissue perfusion will improve Outcome: Progressing   Problem: Clinical Measurements: Goal: Ability to maintain clinical measurements within normal limits will improve Outcome: Progressing

## 2022-11-17 NOTE — Progress Notes (Signed)
[facility] Gastroenterology Progress Note  Debbie Lee 52 y.o. 17-Jun-1971  CC: Anemia, atrial flutter   Subjective: Patient seen and examined at bedside.  Denies any acute GI issues.  ROS : afebrile, mild shortness of breath, negative for active chest pain   Objective: Vital signs in last 24 hours: Vitals:   11/17/22 0421 11/17/22 0833  BP: 120/80 132/84  Pulse: 80   Resp: 20   Temp: 97.7 F (36.5 C)   SpO2: 93%     Physical Exam:  General-morbidly obese.  Resting comfortably.  Not in acute distress. Abdomen-soft, nontender, nondistended, bowel sounds present Heart -tachycardia  Alert and oriented x 3 Mood and affect normal  Lab Results: Recent Labs    11/16/22 0456 11/17/22 0514  NA 136 137  K 3.8 3.9  CL 96* 97*  CO2 30 29  GLUCOSE 181* 239*  BUN 12 13  CREATININE 0.81 0.86  CALCIUM 8.3* 8.5*  MG 2.2 2.6*   Recent Labs    11/16/22 0456 11/17/22 0514  AST 24 39  ALT 25 27  ALKPHOS 70 94  BILITOT 1.4* 1.0  PROT 6.6 7.2  ALBUMIN 3.2* 3.3*   Recent Labs    11/16/22 0456 11/17/22 0514  WBC 10.9* 10.0  HGB 8.6* 9.8*  HCT 32.0* 36.1  MCV 75.1* 75.1*  PLT 309 330   No results for input(s): "LABPROT", "INR" in the last 72 hours.    Assessment/Plan: -Iron deficiency anemia.  Hemoglobin of 8.8 on admission history of chronic anemia..  Low iron saturation.  FOBT negative x 3.  -Atrial flutter.  Started on Eliquis -CHF with EF of 35%. -Morbid obesity.  BMI of 56.  Recommendations -------------------------- -Patient had respiratory decompensation and hypoxemia during EGD.  Patient with FOBT negative x 3.  No further bleeding while on Eliquis.  Bedside monitor still shows atrial flutter with heart rate up to 150. -No plan for inpatient colonoscopy at this time.  We will consider colonoscopy once cardiac issues are well-controlled.  Colonoscopy needs to be done at hospital outpatient setting because of multiple comorbidities. -Discussed with  hospitalist. -GI will sign off.  Call us back if needed.  Follow-up in GI clinic in 4 to 6 months after discharge.  Otis Brace MD, [place] 11/17/2022, 10:44 AM  Contact #  [phone]

## 2022-11-17 NOTE — TOC Progression Note (Addendum)
Transition of Care [facility]) - Progression Note    Patient Details  Name: Debbie Lee MRN: [id number] Date of Birth: Apr 24, 1971  Transition of Care [facility] CM/SW Contact  Gian Ybarra, Juliann Pulse, RN Phone Number: 11/17/2022, 10:48 AM  Clinical Narrative:  On room air. Monitor for d/c plans. 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    Expected Discharge Plan: Home/Self Care Barriers to Discharge: Continued Medical Work up  Expected Discharge Plan and Services                                               Social Determinants of Health (SDOH) Interventions SDOH Screenings   Food Insecurity: Food Insecurity Present (11/16/2022)  Housing: Medium Risk (11/16/2022)  Transportation Needs: No Transportation Needs (11/16/2022)  Recent Concern: Transportation Needs - Unmet Transportation Needs (11/14/2022)  Utilities: Not At Risk (11/16/2022)  Alcohol Screen: Low Risk  (11/16/2022)  Financial Resource Strain: High Risk (11/16/2022)  Tobacco Use: Medium Risk (11/16/2022)    Readmission Risk Interventions     No data to display

## 2022-11-18 ENCOUNTER — Encounter (HOSPITAL_COMMUNITY)
Admission: EM | Disposition: A | Discharge status: Home / Self Care | Payer: Self-pay | Source: Home / Self Care | Attending: Internal Medicine

## 2022-11-18 ENCOUNTER — Inpatient Hospital Stay (HOSPITAL_COMMUNITY): Payer: BLUE CROSS/BLUE SHIELD | Admitting: Anesthesiology

## 2022-11-18 ENCOUNTER — Inpatient Hospital Stay (HOSPITAL_COMMUNITY): Payer: BLUE CROSS/BLUE SHIELD

## 2022-11-18 ENCOUNTER — Encounter (HOSPITAL_COMMUNITY): Payer: Self-pay | Admitting: Internal Medicine

## 2022-11-18 DIAGNOSIS — I4892 Unspecified atrial flutter: Secondary | ICD-10-CM

## 2022-11-18 DIAGNOSIS — D649 Anemia, unspecified: Secondary | ICD-10-CM | POA: Diagnosis not present

## 2022-11-18 DIAGNOSIS — Q2112 Patent foramen ovale: Secondary | ICD-10-CM | POA: Diagnosis not present

## 2022-11-18 DIAGNOSIS — E119 Type 2 diabetes mellitus without complications: Secondary | ICD-10-CM | POA: Diagnosis not present

## 2022-11-18 DIAGNOSIS — I10 Essential (primary) hypertension: Secondary | ICD-10-CM | POA: Diagnosis not present

## 2022-11-18 DIAGNOSIS — I483 Typical atrial flutter: Secondary | ICD-10-CM | POA: Diagnosis not present

## 2022-11-18 HISTORY — PX: CARDIOVERSION: SHX1299

## 2022-11-18 HISTORY — PX: TEE WITHOUT CARDIOVERSION: SHX5443

## 2022-11-18 LAB — GLUCOSE, CAPILLARY
Glucose-Capillary: 247 mg/dL — ABNORMAL HIGH (ref 70–99)
Glucose-Capillary: 225 mg/dL — ABNORMAL HIGH (ref 70–99)
Glucose-Capillary: 193 mg/dL — ABNORMAL HIGH (ref 70–99)
Glucose-Capillary: 247 mg/dL — ABNORMAL HIGH (ref 70–99)

## 2022-11-18 LAB — COMPREHENSIVE METABOLIC PANEL
ALT: 27 U/L (ref 0–44)
AST: 46 U/L — ABNORMAL HIGH (ref 15–41)
Albumin: 3.1 g/dL — ABNORMAL LOW (ref 3.5–5.0)
Alkaline Phosphatase: 99 U/L (ref 38–126)
Anion gap: 11 (ref 5–15)
BUN: 12 mg/dL (ref 6–20)
CO2: 29 mmol/L (ref 22–32)
Calcium: 8.8 mg/dL — ABNORMAL LOW (ref 8.9–10.3)
Chloride: 97 mmol/L — ABNORMAL LOW (ref 98–111)
Creatinine, Ser: 0.87 mg/dL (ref 0.44–1.00)
GFR, Estimated: >60 mL/min (ref >60)
Glucose, Bld: 245 mg/dL — ABNORMAL HIGH (ref 70–99)
Potassium: 4.2 mmol/L (ref 3.5–5.1)
Sodium: 137 mmol/L (ref 135–145)
Total Bilirubin: 1 mg/dL (ref 0.3–1.2)
Total Protein: 7.2 g/dL (ref 6.5–8.1)

## 2022-11-18 LAB — CBC
HCT: 35.9 % — ABNORMAL LOW (ref 36.0–46.0)
Hemoglobin: 9.6 g/dL — ABNORMAL LOW (ref 12.0–15.0)
MCH: 20 pg — ABNORMAL LOW (ref 26.0–34.0)
MCHC: 26.7 g/dL — ABNORMAL LOW (ref 30.0–36.0)
MCV: 74.6 fL — ABNORMAL LOW (ref 80.0–100.0)
Platelets: 348 10*3/uL (ref 150–400)
RBC: 4.81 MIL/uL (ref 3.87–5.11)
RDW: 19.6 % — ABNORMAL HIGH (ref 11.5–15.5)
WBC: 13.6 10*3/uL — ABNORMAL HIGH (ref 4.0–10.5)
nRBC: 0 % (ref 0.0–0.2)

## 2022-11-18 LAB — MAGNESIUM: Magnesium: 2 mg/dL (ref 1.7–2.4)

## 2022-11-18 LAB — PROTIME-INR
INR: 1.3 — ABNORMAL HIGH (ref 0.8–1.2)
Prothrombin Time: 15.7 seconds — ABNORMAL HIGH (ref 11.4–15.2)

## 2022-11-18 LAB — ECHO TEE

## 2022-11-18 SURGERY — ECHOCARDIOGRAM, TRANSESOPHAGEAL
Anesthesia: General

## 2022-11-18 MED ORDER — SPIRONOLACTONE 12.5 MG HALF TABLET
12.5000 mg | ORAL_TABLET | Freq: Every day | ORAL | Status: DC
  Administered 2022-11-19 – 2022-11-20 (×2): 12.5 mg via ORAL
  Filled 2022-11-18 (×2): qty 1

## 2022-11-18 MED ORDER — PROPOFOL 500 MG/50ML IV EMUL
INTRAVENOUS | Status: DC | PRN
  Administered 2022-11-18: 150 ug/kg/min via INTRAVENOUS

## 2022-11-18 MED ORDER — PROPOFOL 10 MG/ML IV BOLUS
INTRAVENOUS | Status: DC | PRN
  Administered 2022-11-18 (×4): 50 mg via INTRAVENOUS

## 2022-11-18 MED ORDER — INFLUENZA VAC SPLIT QUAD 0.5 ML IM SUSY
0.5000 mL | PREFILLED_SYRINGE | INTRAMUSCULAR | Status: AC
  Administered 2022-11-19: 0.5 mL via INTRAMUSCULAR
  Filled 2022-11-18: qty 0.5

## 2022-11-18 MED ORDER — SODIUM CHLORIDE 0.9 % IV SOLN: INTRAVENOUS | Status: DC

## 2022-11-18 MED ORDER — SODIUM CHLORIDE 0.9 % IV SOLN: INTRAVENOUS | Status: DC | PRN

## 2022-11-18 NOTE — Interval H&P Note (Signed)
History and Physical Interval Note:  11/18/2022 11:50 AM  Debbie Lee  has presented today for surgery, with the diagnosis of AFLUTTER.  The various methods of treatment have been discussed with the patient and family. After consideration of risks, benefits and other options for treatment, the patient has consented to  Procedure(s): TRANSESOPHAGEAL ECHOCARDIOGRAM (TEE) (N/A) CARDIOVERSION (N/A) as a surgical intervention.  The patient's history has been reviewed, patient examined, no change in status, stable for surgery.  I have reviewed the patient's chart and labs.  Questions were answered to the patient's satisfaction.     Skeet Latch, MD

## 2022-11-18 NOTE — H&P (View-Only) (Signed)
Rounding Note    Patient Name: Debbie Lee Date of Encounter: 11/18/2022  [place] Cardiologist: Jenkins Rouge, MD   Subjective   No dyspnea flutter rate better controlled   Inpatient Medications    Scheduled Meds:  apixaban  5 mg Oral BID   carvedilol  6.25 mg Oral BID WC   DULoxetine  60 mg Oral Daily   furosemide  20 mg Intravenous Daily   [START ON 11/19/2022] influenza vac split quadrivalent PF  0.5 mL Intramuscular Tomorrow-1000   insulin aspart  0-9 Units Subcutaneous TID WC   insulin glargine-yfgn  10 Units Subcutaneous Daily   metFORMIN  500 mg Oral BID WC   pantoprazole  40 mg Oral Q0600   pneumococcal 20-valent conjugate vaccine  0.5 mL Intramuscular Tomorrow-1000   sacubitril-valsartan  1 tablet Oral BID   Continuous Infusions:  diltiazem (CARDIZEM) infusion 15 mg/hr (11/18/22 0505)   PRN Meds: acetaminophen **OR** acetaminophen, HYDROcodone-acetaminophen, mouth rinse   Vital Signs    Vitals:   11/18/22 0000 11/18/22 0430 11/18/22 0500 11/18/22 0608  BP:    138/82  Pulse: (!) 151   (!) 109  Resp:    (!) 27  Temp:   97.8 F (36.6 C)   TempSrc:      SpO2: 99% 91% 96% 95%  Weight:   (!) 148.6 kg   Height:        Intake/Output Summary (Last 24 hours) at 11/18/2022 0941 Last data filed at 11/18/2022 0800 Gross per 24 hour  Intake 595 ml  Output 4001 ml  Net -3406 ml      11/18/2022    5:00 AM 11/17/2022    4:28 AM 11/16/2022    5:00 AM  Last 3 Weights  Weight (lbs) 327 lb 9.7 oz 328 lb 12.8 oz 330 lb 1.6 oz  Weight (kg) 148.6 kg 149.143 kg 149.732 kg      Telemetry    Atrial flutter with RVR - Personally Reviewed  ECG    3:1 atrial flutter - Personally Reviewed  Physical Exam   GEN: No acute distress.   Neck: No JVD Cardiac: tachycardic, irregular, no murmurs, rubs, or gallops.  Respiratory: Clear to auscultation bilaterally. GI: Soft, nontender, non-distended  MS: No edema; No deformity. Neuro:  Nonfocal  Psych:  Normal affect   Labs    High Sensitivity Troponin:   Recent Labs  Lab 11/13/22 1758 11/13/22 2035  TROPONINIHS 4 5     Chemistry Recent Labs  Lab 11/16/22 0456 11/17/22 0514 11/18/22 0443  NA 136 137 137  K 3.8 3.9 4.2  CL 96* 97* 97*  CO2 '30 29 29  '$ GLUCOSE 181* 239* 245*  BUN '12 13 12  '$ CREATININE 0.81 0.86 0.87  CALCIUM 8.3* 8.5* 8.8*  MG 2.2 2.6* 2.0  PROT 6.6 7.2 7.2  ALBUMIN 3.2* 3.3* 3.1*  AST 24 39 46*  ALT '25 27 27  '$ ALKPHOS 70 94 99  BILITOT 1.4* 1.0 1.0  GFRNONAA >60 >60 >60  ANIONGAP '10 11 11    '$ Lipids No results for input(s): "CHOL", "TRIG", "HDL", "LABVLDL", "LDLCALC", "CHOLHDL" in the last 168 hours.  Hematology Recent Labs  Lab 11/16/22 0456 11/17/22 0514 11/18/22 0443  WBC 10.9* 10.0 13.6*  RBC 4.26 4.81 4.81  HGB 8.6* 9.8* 9.6*  HCT 32.0* 36.1 35.9*  MCV 75.1* 75.1* 74.6*  MCH 20.2* 20.4* 20.0*  MCHC 26.9* 27.1* 26.7*  RDW 19.4* 19.4* 19.6*  PLT 309 330 348   Thyroid  Recent Labs  Lab 11/13/22 1758  TSH 6.845*  FREET4 0.89    BNP Recent Labs  Lab 11/13/22 1758  BNP 228.6*    DDimer  Recent Labs  Lab 11/13/22 1829  DDIMER 1.21*     Radiology    DG Chest 2 View  Result Date: 11/16/2022 CLINICAL DATA:  Pleural effusion. EXAM: CHEST - 2 VIEW COMPARISON:  CXR 11/13/22 FINDINGS: No pleural effusion is visualized. No pneumothorax. No focal airspace opacity. Unchanged prominent cardiac contours. Visualized upper abdomen is unremarkable. No radiographically apparent displaced rib fractures. IMPRESSION: No pleural effusion.  No focal airspace opacity. Electronically Signed   By: Marin Roberts M.D.   On: 11/16/2022 14:24    Cardiac Studies   Echo 11/14/2022 1. Left ventricular ejection fraction, by estimation, is 30 to 35%. The  left ventricle has moderately decreased function. The left ventricle  demonstrates global hypokinesis. Left ventricular diastolic function could  not be evaluated.   2. Right ventricular systolic function  is moderately reduced. The right  ventricular size is normal. There is normal pulmonary artery systolic  pressure.   3. Left atrial size was mildly dilated.   4. Right atrial size was mildly dilated.   5. The mitral valve is normal in structure. No evidence of mitral valve  regurgitation. No evidence of mitral stenosis.   6. The aortic valve is tricuspid. Aortic valve regurgitation is not  visualized. No aortic stenosis is present.   7. The inferior vena cava is dilated in size with >50% respiratory  variability, suggesting right atrial pressure of 8 mmHg.   Patient Profile     52 y.o. female with PMH of SVT, hypertension and newly diagnosed DM2 presented with atrial flutter. Also found to have anemia, underwent EGD on 2/20. 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 Coreg, entresto, plan to add spironolactone prior to DC. Despite decompensation during EGD  she appears to be euvolemic. Suspect issue was obesity and OSA   HTN: coreg and entresto. Plan to add spironolactone prior to DC have written for am   H/o SVT: occurred in 2022, converted on adenosine at the time  Newly diagnosed DM II: managed by primary team  Anemia: underwent EGD no ulcer or bleeding source , decompensated during the study. Holding off on colonoscopy for now. Hct stable 35.9   OSA: likely contributed to the decompensation may be issue with sats during TEE       For questions or updates, please contact [place] Please consult [email or web address] for contact info under        Signed, Jenkins Rouge, MD  11/18/2022, 9:41 AM

## 2022-11-18 NOTE — Anesthesia Procedure Notes (Signed)
Procedure Name: MAC Date/Time: 11/18/2022 12:00 PM  Performed by: Lieutenant Diego, CRNAPre-anesthesia Checklist: Patient identified, Emergency Drugs available, Suction available, Patient being monitored and Timeout performed Patient Re-evaluated:Patient Re-evaluated prior to induction Oxygen Delivery Method: Nasal cannula Preoxygenation: Pre-oxygenation with 100% oxygen Induction Type: IV induction

## 2022-11-18 NOTE — Transfer of Care (Signed)
Immediate Anesthesia Transfer of Care Note  Patient: Debbie Lee  Procedure(s) Performed: TRANSESOPHAGEAL ECHOCARDIOGRAM (TEE) CARDIOVERSION  Patient Location: Endoscopy Unit  Anesthesia Type:MAC  Level of Consciousness: awake  Airway & Oxygen Therapy: Patient Spontanous Breathing and Patient connected to nasal cannula oxygen  Post-op Assessment: Report given to RN and Post -op Vital signs reviewed and stable  Post vital signs: Reviewed and stable  Last Vitals:  Vitals Value Taken Time  BP    Temp    Pulse 83 11/18/22 1238  Resp 25 11/18/22 1238  SpO2 98 % 11/18/22 1238  Vitals shown include unvalidated device data.  Last Pain:  Vitals:   11/18/22 1151  TempSrc: Temporal  PainSc:       Patients Stated Pain Goal: 0 ([id number])  Complications: No notable events documented.

## 2022-11-18 NOTE — Inpatient Diabetes Management (Signed)
Inpatient Diabetes Program Recommendations  AACE/ADA: New Consensus Statement on Inpatient Glycemic Control (2015)  Target Ranges:  Prepandial:   less than 140 mg/dL      Peak postprandial:   less than 180 mg/dL (1-2 hours)      Critically ill patients:  140 - 180 mg/dL   Lab Results  Component Value Date   GLUCAP 225 (H) 11/18/2022   HGBA1C 9.3 (H) 11/14/2022    Review of Glycemic Control  Diabetes history: DM2 Outpatient Diabetes medications: None, has been on metformin in the past Current orders for Inpatient glycemic control: Semglee 10 QD, Novolog 0-9 units TID, metformin 500 mg BID  HgbA1C - 9.3% - have discussed extensively with pt For TEE this am  Inpatient Diabetes Program Recommendations:    For discharge:  Novolin ReliOn 70/30 flexpen 20 units BID Metformin 500 mg BID [id number]) Can purchase at [facility] for $44 for 5 pens. Pt needs low-cost insulin and can afford this she states.  Insulin pen needles [id number])  Blood glucose meter kit [id number])  Long discussion with pt on 2/22 about importance of keeping blood sugars in good control to decrease risks of complications. We discussed trying to be as active as possible and eating regularly 3 meals/day with portion control. Weight loss and lifestyle modification is imperative in order to meet health goals and feel better. Willing to take 2 insulin shots/day. Reviewed insulin pen administration and pt was able to do teach-back with insulin pen. RN was allowing pt to give herself insulin with  syringe in abdomen and stick finger for CBG checks. Pt states she feels confident going home on insulin. Will f/u with PCP after discharge. Discussed hypoglycemia s/s and treatment and pt voices understanding. Has Living Well book and insulin pen starter kit. Seems motivated to make changes to control her blood sugars at home. Answered all questions/concerns.   Thank you. Lorenda Peck, RD, LDN, [place] Inpatient Diabetes  Coordinator [phone]

## 2022-11-18 NOTE — Progress Notes (Signed)
  Echocardiogram Echocardiogram Transesophageal has been performed.  Debbie Lee 11/18/2022, 12:43 PM

## 2022-11-18 NOTE — Progress Notes (Signed)
[facility]                                                                               Debbie Lee, is a 52 y.o. 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 Came to [facility] ED 2/18 to 2:1 flutter vagal maneuver unsuccessful . She was found to be in atrial flutter. Cardiology consulted and she was started on IV cardizem gtt. She is scheduled to undergo DCCV later today.    Assessment & Plan    Assessment and Plan:  Atrial flutter  Rate not well controlled. Rate between 120 to 150/min  Cardiology consulted and plan for TEE cardioversion today She is currently on cardizem gtt and coreg.  Echocardiogram showed 30 to 35%. The  left ventricle has moderately decreased function. The left ventricle  demonstrates global hypokinesis. Left ventricular diastolic function could  not be evaluated.  Continue to monitor.    Acute systolic heart failure:  Euvolemic.    Iron deficiency anemia:  Microcytic anemia.  No plans for colonoscopy inpatient  per GI. Outpatient colonoscopy due to multiple co morbidities.  S/p EGD on 11/15/2022, no bleeding seen. EGD wnl.  Continue with PPI daily.    OSA  Not on CPAP at home, she will need sleep study on discharge    Hypertension;  Well controlled BP parameters.     H/o SVT: Converted by  adenosine.    Type 2 DM: uncontrolled with hyperglycemia.  - Hemoglobin A1c is 9.3%. CBG (last 3)  Recent Labs    11/18/22 0330 11/18/22 0737 11/18/22 1151  GLUCAP 247* 225* 193*   Not well controlled. Started her on semglee 10 units daily. Hemoglobin A1c is 9.3%.  Resume SSI.   Depression  Resume cymbalta .  Body mass index is 59.92  kg/m. Morbid obesity. Outpatient follow up with PCP.    Estimated body mass index is 59.92 kg/m as calculated from the following:   Height as of this encounter: '5\' 2"'$  (1.575 m).   Weight as of this encounter: 148.6 kg.  Code Status: full code.  DVT Prophylaxis:   apixaban (ELIQUIS) tablet 5 mg   Level of Care: Level of care: Progressive Family Communication: none at bedside.   Disposition Plan:     pending TEE/ Cardioversion.   Procedures:  Echocardiogram.  TEE and cardioversion scheduled on 2/23/ 24  Consultants:   Cardiology Gastroenterology.   Antimicrobials:   Anti-infectives (From admission, onward)    None        Medications  Scheduled Meds:  [MAR Hold] apixaban  5 mg Oral BID   [MAR Hold] carvedilol  6.25 mg Oral BID WC   [MAR Hold] DULoxetine  60 mg Oral Daily   [MAR Hold] furosemide  20 mg Intravenous Daily   [START ON 11/19/2022] influenza vac split quadrivalent PF  0.5 mL Intramuscular  Tomorrow-1000   [MAR Hold] insulin aspart  0-9 Units Subcutaneous TID WC   [MAR Hold] insulin glargine-yfgn  10 Units Subcutaneous Daily   [MAR Hold] metFORMIN  500 mg Oral BID WC   [MAR Hold] pantoprazole  40 mg Oral Q0600   [MAR Hold] pneumococcal 20-valent conjugate vaccine  0.5 mL Intramuscular Tomorrow-1000   [MAR Hold] sacubitril-valsartan  1 tablet Oral BID   [MAR Hold] spironolactone  12.5 mg Oral Daily   Continuous Infusions:  [MAR Hold] diltiazem (CARDIZEM) infusion 15 mg/hr (11/18/22 0505)   PRN Meds:.[MAR Hold] acetaminophen **OR** [MAR Hold] acetaminophen, [MAR Hold] HYDROcodone-acetaminophen, [MAR Hold] mouth rinse    Subjective:   Debbie Lee was seen and examined today.  No new complaints.  Objective:   Vitals:   11/18/22 0500 11/18/22 0608 11/18/22 1146 11/18/22 1151  BP:  138/82 (!) 108/94   Pulse:  (!) 109    Resp:  (!) 27 16   Temp: 97.8 F (36.6 C)   (!) 97.3 F (36.3 C)  TempSrc:    Temporal  SpO2: 96% 95% 96%   Weight: (!) 148.6  kg     Height:        Intake/Output Summary (Last 24 hours) at 11/18/2022 1218 Last data filed at 11/18/2022 1000 Gross per 24 hour  Intake 355 ml  Output 3101 ml  Net -2746 ml    Filed Weights   11/16/22 0500 11/17/22 0428 11/18/22 0500  Weight: (!) 149.7 kg (!) 149.1 kg (!) 148.6 kg     Exam General exam: Appears calm and comfortable  Respiratory system: Clear to auscultation. Respiratory effort normal. Cardiovascular system: S1 & S2 heard, irregularly irregular, no JVD.  Gastrointestinal system: Abdomen is nondistended, soft and nontender.  Central nervous system: Alert and oriented. No focal neurological deficits. Extremities: Symmetric 5 x 5 power. Skin: No rashes, lesions or ulcers Psychiatry: mood is appropriate.     Data Reviewed:  I have personally reviewed following labs and imaging studies   CBC Lab Results  Component Value Date   WBC 13.6 (H) 11/18/2022   RBC 4.81 11/18/2022   HGB 9.6 (L) 11/18/2022   HCT 35.9 (L) 11/18/2022   MCV 74.6 (L) 11/18/2022   MCH 20.0 (L) 11/18/2022   PLT 348 11/18/2022   MCHC 26.7 (L) 11/18/2022   RDW 19.6 (H) 11/18/2022   LYMPHSABS 2.0 11/13/2022   MONOABS 0.5 11/13/2022   EOSABS 0.2 11/13/2022   BASOSABS 0.0 123456     Last metabolic panel Lab Results  Component Value Date   NA 137 11/18/2022   K 4.2 11/18/2022   CL 97 (L) 11/18/2022   CO2 29 11/18/2022   BUN 12 11/18/2022   CREATININE 0.87 11/18/2022   GLUCOSE 245 (H) 11/18/2022   GFRNONAA >60 11/18/2022   GFRAA >60 10/29/2019   CALCIUM 8.8 (L) 11/18/2022   PHOS 3.4 11/14/2022   PROT 7.2 11/18/2022   ALBUMIN 3.1 (L) 11/18/2022   BILITOT 1.0 11/18/2022   ALKPHOS 99 11/18/2022   AST 46 (H) 11/18/2022   ALT 27 11/18/2022   ANIONGAP 11 11/18/2022    CBG (last 3)  Recent Labs    11/18/22 0330 11/18/22 0737 11/18/22 1151  GLUCAP 247* 225* 193*       Coagulation Profile: Recent Labs  Lab 11/13/22 2055  INR 1.4*      Radiology  Studies: DG Chest 2 View  Result Date: 11/16/2022 CLINICAL DATA:  Pleural effusion. EXAM: CHEST - 2 VIEW COMPARISON:  CXR 11/13/22  FINDINGS: No pleural effusion is visualized. No pneumothorax. No focal airspace opacity. Unchanged prominent cardiac contours. Visualized upper abdomen is unremarkable. No radiographically apparent displaced rib fractures. IMPRESSION: No pleural effusion.  No focal airspace opacity. Electronically Signed   By: Marin Roberts M.D.   On: 11/16/2022 14:24       Hosie Poisson M.D. [facility] 11/18/2022, 12:18 PM  Available via Epic secure chat 7am-7pm After 7 pm, please refer to night coverage provider listed on amion.

## 2022-11-18 NOTE — Progress Notes (Signed)
CCMD called about the pt's O2 sat keep dropping. The pt refused to use CPAP when she sleep. RN put [place] back to pt. Pt current is 2L oxygen, O2 is 96%.

## 2022-11-18 NOTE — CV Procedure (Signed)
Brief TEE Note  LVEF 30-35%. Global hypokinesis.  No LA/LAA thrombus or masses Mild TR.   Trivial MR Trivial PR.  Electrical Cardioversion Procedure Note MORGIN DAIGRE [id number] 12/21/1970  Procedure: Electrical Cardioversion Indications:  Atrial Fibrillation  Procedure Details Consent: Risks of procedure as well as the alternatives and risks of each were explained to the (patient/caregiver).  Consent for procedure obtained. Time Out: Verified patient identification, verified procedure, site/side was marked, verified correct patient position, special equipment/implants available, medications/allergies/relevent history reviewed, required imaging and test results available.  Performed  Patient placed on cardiac monitor, pulse oximetry, supplemental oxygen as necessary.  Sedation given:  propofol Pacer pads placed anterior and posterior chest.  Cardioverted 1 time(s).  Cardioverted at [place].  Evaluation Findings: Post procedure EKG shows: NSR Complications: None Patient did tolerate procedure well.   Skeet Latch, MD 11/18/2022, 12:32 PM

## 2022-11-18 NOTE — Anesthesia Postprocedure Evaluation (Signed)
Anesthesia Post Note  Patient: Debbie Lee  Procedure(s) Performed: TRANSESOPHAGEAL ECHOCARDIOGRAM (TEE) CARDIOVERSION     Patient location during evaluation: Endoscopy Anesthesia Type: MAC Level of consciousness: awake and alert Pain management: pain level controlled Vital Signs Assessment: post-procedure vital signs reviewed and stable Respiratory status: spontaneous breathing and respiratory function stable Cardiovascular status: stable Postop Assessment: no apparent nausea or vomiting Anesthetic complications: no  No notable events documented.  Last Vitals:  Vitals:   11/18/22 1320 11/18/22 1330  BP:  138/84  Pulse: 92 96  Resp: (!) 21 (!) 31  Temp:    SpO2: 99% 100%    Last Pain:  Vitals:   11/18/22 1330  TempSrc:   PainSc: 0-No pain                 Shanera Meske DANIEL

## 2022-11-18 NOTE — Progress Notes (Signed)
Rounding Note    Patient Name: Debbie Lee Date of Encounter: 11/18/2022  [place] Cardiologist: Jenkins Rouge, MD   Subjective   No dyspnea flutter rate better controlled   Inpatient Medications    Scheduled Meds:  apixaban  5 mg Oral BID   carvedilol  6.25 mg Oral BID WC   DULoxetine  60 mg Oral Daily   furosemide  20 mg Intravenous Daily   [START ON 11/19/2022] influenza vac split quadrivalent PF  0.5 mL Intramuscular Tomorrow-1000   insulin aspart  0-9 Units Subcutaneous TID WC   insulin glargine-yfgn  10 Units Subcutaneous Daily   metFORMIN  500 mg Oral BID WC   pantoprazole  40 mg Oral Q0600   pneumococcal 20-valent conjugate vaccine  0.5 mL Intramuscular Tomorrow-1000   sacubitril-valsartan  1 tablet Oral BID   Continuous Infusions:  diltiazem (CARDIZEM) infusion 15 mg/hr (11/18/22 0505)   PRN Meds: acetaminophen **OR** acetaminophen, HYDROcodone-acetaminophen, mouth rinse   Vital Signs    Vitals:   11/18/22 0000 11/18/22 0430 11/18/22 0500 11/18/22 0608  BP:    138/82  Pulse: (!) 151   (!) 109  Resp:    (!) 27  Temp:   97.8 F (36.6 C)   TempSrc:      SpO2: 99% 91% 96% 95%  Weight:   (!) 148.6 kg   Height:        Intake/Output Summary (Last 24 hours) at 11/18/2022 0941 Last data filed at 11/18/2022 0800 Gross per 24 hour  Intake 595 ml  Output 4001 ml  Net -3406 ml      11/18/2022    5:00 AM 11/17/2022    4:28 AM 11/16/2022    5:00 AM  Last 3 Weights  Weight (lbs) 327 lb 9.7 oz 328 lb 12.8 oz 330 lb 1.6 oz  Weight (kg) 148.6 kg 149.143 kg 149.732 kg      Telemetry    Atrial flutter with RVR - Personally Reviewed  ECG    3:1 atrial flutter - Personally Reviewed  Physical Exam   GEN: No acute distress.   Neck: No JVD Cardiac: tachycardic, irregular, no murmurs, rubs, or gallops.  Respiratory: Clear to auscultation bilaterally. GI: Soft, nontender, non-distended  MS: No edema; No deformity. Neuro:  Nonfocal  Psych:  Normal affect   Labs    High Sensitivity Troponin:   Recent Labs  Lab 11/13/22 1758 11/13/22 2035  TROPONINIHS 4 5     Chemistry Recent Labs  Lab 11/16/22 0456 11/17/22 0514 11/18/22 0443  NA 136 137 137  K 3.8 3.9 4.2  CL 96* 97* 97*  CO2 '30 29 29  '$ GLUCOSE 181* 239* 245*  BUN '12 13 12  '$ CREATININE 0.81 0.86 0.87  CALCIUM 8.3* 8.5* 8.8*  MG 2.2 2.6* 2.0  PROT 6.6 7.2 7.2  ALBUMIN 3.2* 3.3* 3.1*  AST 24 39 46*  ALT '25 27 27  '$ ALKPHOS 70 94 99  BILITOT 1.4* 1.0 1.0  GFRNONAA >60 >60 >60  ANIONGAP '10 11 11    '$ Lipids No results for input(s): "CHOL", "TRIG", "HDL", "LABVLDL", "LDLCALC", "CHOLHDL" in the last 168 hours.  Hematology Recent Labs  Lab 11/16/22 0456 11/17/22 0514 11/18/22 0443  WBC 10.9* 10.0 13.6*  RBC 4.26 4.81 4.81  HGB 8.6* 9.8* 9.6*  HCT 32.0* 36.1 35.9*  MCV 75.1* 75.1* 74.6*  MCH 20.2* 20.4* 20.0*  MCHC 26.9* 27.1* 26.7*  RDW 19.4* 19.4* 19.6*  PLT 309 330 348   Thyroid  Recent Labs  Lab 11/13/22 1758  TSH 6.845*  FREET4 0.89    BNP Recent Labs  Lab 11/13/22 1758  BNP 228.6*    DDimer  Recent Labs  Lab 11/13/22 1829  DDIMER 1.21*     Radiology    DG Chest 2 View  Result Date: 11/16/2022 CLINICAL DATA:  Pleural effusion. EXAM: CHEST - 2 VIEW COMPARISON:  CXR 11/13/22 FINDINGS: No pleural effusion is visualized. No pneumothorax. No focal airspace opacity. Unchanged prominent cardiac contours. Visualized upper abdomen is unremarkable. No radiographically apparent displaced rib fractures. IMPRESSION: No pleural effusion.  No focal airspace opacity. Electronically Signed   By: Marin Roberts M.D.   On: 11/16/2022 14:24    Cardiac Studies   Echo 11/14/2022 1. Left ventricular ejection fraction, by estimation, is 30 to 35%. The  left ventricle has moderately decreased function. The left ventricle  demonstrates global hypokinesis. Left ventricular diastolic function could  not be evaluated.   2. Right ventricular systolic function  is moderately reduced. The right  ventricular size is normal. There is normal pulmonary artery systolic  pressure.   3. Left atrial size was mildly dilated.   4. Right atrial size was mildly dilated.   5. The mitral valve is normal in structure. No evidence of mitral valve  regurgitation. No evidence of mitral stenosis.   6. The aortic valve is tricuspid. Aortic valve regurgitation is not  visualized. No aortic stenosis is present.   7. The inferior vena cava is dilated in size with >50% respiratory  variability, suggesting right atrial pressure of 8 mmHg.   Patient Profile     52 y.o. female with PMH of SVT, hypertension and newly diagnosed DM2 presented with atrial flutter. Also found to have anemia, underwent EGD on 2/20. 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 Coreg, entresto, plan to add spironolactone prior to DC. Despite decompensation during EGD  she appears to be euvolemic. Suspect issue was obesity and OSA   HTN: coreg and entresto. Plan to add spironolactone prior to DC have written for am   H/o SVT: occurred in 2022, converted on adenosine at the time  Newly diagnosed DM II: managed by primary team  Anemia: underwent EGD no ulcer or bleeding source , decompensated during the study. Holding off on colonoscopy for now. Hct stable 35.9   OSA: likely contributed to the decompensation may be issue with sats during TEE       For questions or updates, please contact [place] Please consult [email or web address] for contact info under        Signed, Jenkins Rouge, MD  11/18/2022, 9:41 AM

## 2022-11-18 NOTE — Anesthesia Preprocedure Evaluation (Signed)
Anesthesia Evaluation    Reviewed: Allergy & Precautions, Patient's Chart, lab work & pertinent test results, reviewed documented beta blocker date and time   History of Anesthesia Complications Negative for: history of anesthetic complications  Airway Mallampati: II  TM Distance: >3 FB Neck ROM: Full    Dental no notable dental hx.    Pulmonary former smoker   Pulmonary exam normal        Cardiovascular hypertension, Pt. on home beta blockers and Pt. on medications +CHF  Normal cardiovascular exam+ dysrhythmias Atrial Fibrillation (-) pacemaker  IMPRESSIONS     1. Left ventricular ejection fraction, by estimation, is 30 to 35%. The  left ventricle has moderately decreased function. The left ventricle  demonstrates global hypokinesis. Left ventricular diastolic function could  not be evaluated.   2. Right ventricular systolic function is moderately reduced. The right  ventricular size is normal. There is normal pulmonary artery systolic  pressure.   3. Left atrial size was mildly dilated.   4. Right atrial size was mildly dilated.   5. The mitral valve is normal in structure. No evidence of mitral valve  regurgitation. No evidence of mitral stenosis.   6. The aortic valve is tricuspid. Aortic valve regurgitation is not  visualized. No aortic stenosis is present.   7. The inferior vena cava is dilated in size with >50% respiratory  variability, suggesting right atrial pressure of 8 mmHg.      Neuro/Psych  PSYCHIATRIC DISORDERS Anxiety Depression    negative neurological ROS     GI/Hepatic negative GI ROS, Neg liver ROS,,,  Endo/Other    Morbid obesity  Renal/GU negative Renal ROS     Musculoskeletal  (+) Arthritis ,    Abdominal  (+) + obese  Peds  Hematology negative hematology ROS (+) Blood dyscrasia, anemia   Anesthesia Other Findings   Reproductive/Obstetrics negative OB ROS                              Anesthesia Physical Anesthesia Plan  ASA: 3  Anesthesia Plan: General   Post-op Pain Management: GA combined w/ Regional for post-op pain and Minimal or no pain anticipated   Induction: Intravenous  PONV Risk Score and Plan: 3 and TIVA and Treatment may vary due to age or medical condition  Airway Management Planned: Mask  Additional Equipment: None  Intra-op Plan:   Post-operative Plan:   Informed Consent:   Plan Discussed with: Anesthesiologist  Anesthesia Plan Comments:         Anesthesia Quick Evaluation

## 2022-11-18 NOTE — Progress Notes (Signed)
Pt said she is having hard time wearing the cpap. Machine remained bedside if pt changed her mind. Advised pt to let RN know to call RT if needed. Machine is ready to be worn.

## 2022-11-19 DIAGNOSIS — I4892 Unspecified atrial flutter: Secondary | ICD-10-CM | POA: Diagnosis not present

## 2022-11-19 DIAGNOSIS — E119 Type 2 diabetes mellitus without complications: Secondary | ICD-10-CM | POA: Diagnosis not present

## 2022-11-19 DIAGNOSIS — I10 Essential (primary) hypertension: Secondary | ICD-10-CM | POA: Diagnosis not present

## 2022-11-19 DIAGNOSIS — D649 Anemia, unspecified: Secondary | ICD-10-CM | POA: Diagnosis not present

## 2022-11-19 LAB — CBC
HCT: 35.3 % — ABNORMAL LOW (ref 36.0–46.0)
Hemoglobin: 9.4 g/dL — ABNORMAL LOW (ref 12.0–15.0)
MCH: 20.3 pg — ABNORMAL LOW (ref 26.0–34.0)
MCHC: 26.6 g/dL — ABNORMAL LOW (ref 30.0–36.0)
MCV: 76.1 fL — ABNORMAL LOW (ref 80.0–100.0)
Platelets: 346 10*3/uL (ref 150–400)
RBC: 4.64 MIL/uL (ref 3.87–5.11)
RDW: 19.6 % — ABNORMAL HIGH (ref 11.5–15.5)
WBC: 12.7 10*3/uL — ABNORMAL HIGH (ref 4.0–10.5)
nRBC: 0 % (ref 0.0–0.2)

## 2022-11-19 LAB — GLUCOSE, CAPILLARY
Glucose-Capillary: 184 mg/dL — ABNORMAL HIGH (ref 70–99)
Glucose-Capillary: 184 mg/dL — ABNORMAL HIGH (ref 70–99)
Glucose-Capillary: 194 mg/dL — ABNORMAL HIGH (ref 70–99)
Glucose-Capillary: 200 mg/dL — ABNORMAL HIGH (ref 70–99)
Glucose-Capillary: 211 mg/dL — ABNORMAL HIGH (ref 70–99)
Glucose-Capillary: 218 mg/dL — ABNORMAL HIGH (ref 70–99)

## 2022-11-19 MED ORDER — SODIUM CHLORIDE 0.9 % IV SOLN
25.0000 mg | Freq: Once | INTRAVENOUS | Status: AC
  Administered 2022-11-19: 25 mg via INTRAVENOUS
  Filled 2022-11-19: qty 0.5

## 2022-11-19 MED ORDER — SODIUM CHLORIDE 0.9 % IV SOLN
500.0000 mg | Freq: Once | INTRAVENOUS | Status: AC
  Administered 2022-11-19: 500 mg via INTRAVENOUS
  Filled 2022-11-19: qty 10

## 2022-11-19 MED ORDER — CARVEDILOL 12.5 MG PO TABS
12.5000 mg | ORAL_TABLET | Freq: Two times a day (BID) | ORAL | Status: DC
  Administered 2022-11-19 – 2022-11-20 (×4): 12.5 mg via ORAL
  Filled 2022-11-19 (×4): qty 1

## 2022-11-19 NOTE — Progress Notes (Signed)
Patients IV infiltrated, MD aware, verbal order given to leave IV out, possible discharge tomorrow.

## 2022-11-19 NOTE — Progress Notes (Signed)
Progress Note  Patient Name: Debbie Lee Date of Encounter: 11/19/2022  Primary Cardiologist: Jenkins Rouge, MD   Subjective   Overnight successful DCCV. Patient notes EF 30-35% Did not want CPAP overnight.  Inpatient Medications    Scheduled Meds:  apixaban  5 mg Oral BID   carvedilol  6.25 mg Oral BID WC   DULoxetine  60 mg Oral Daily   furosemide  20 mg Intravenous Daily   influenza vac split quadrivalent PF  0.5 mL Intramuscular Tomorrow-1000   insulin aspart  0-9 Units Subcutaneous TID WC   insulin glargine-yfgn  10 Units Subcutaneous Daily   metFORMIN  500 mg Oral BID WC   pantoprazole  40 mg Oral Q0600   pneumococcal 20-valent conjugate vaccine  0.5 mL Intramuscular Tomorrow-1000   sacubitril-valsartan  1 tablet Oral BID   spironolactone  12.5 mg Oral Daily   Continuous Infusions:  diltiazem (CARDIZEM) infusion Stopped (11/18/22 1132)   PRN Meds: acetaminophen **OR** acetaminophen, HYDROcodone-acetaminophen, mouth rinse   Vital Signs    Vitals:   11/18/22 1656 11/18/22 1946 11/18/22 2359 11/19/22 0405  BP: 138/76 114/64  (!) 142/70  Pulse: (!) 102 100  (!) 106  Resp: 20 19    Temp: 97.6 F (36.4 C) 98.6 F (37 C)  97.9 F (36.6 C)  TempSrc: Oral Oral  Oral  SpO2: 99% 94% 96% 96%  Weight:      Height:        Intake/Output Summary (Last 24 hours) at 11/19/2022 0729 Last data filed at 11/19/2022 0300 Gross per 24 hour  Intake 1294.35 ml  Output 2645 ml  Net -1350.65 ml   Filed Weights   11/16/22 0500 11/17/22 0428 11/18/22 0500  Weight: (!) 149.7 kg (!) 149.1 kg (!) 148.6 kg    Telemetry    Sinus tachycardia - Personally Reviewed  ECG    (11/19/22) SR rate 98 Biatrial enlargement lateral TWI  - Personally Reviewed  Physical Exam   Gen: no distress, morbid obesity   Neck: Thick neck- cannot assess JVD Cardiac: No Rubs or Gallops, II/vI holosystolic murmur, regular tachycardia, +2 radial pulses Respiratory: Clear to auscultation  bilaterally, normal effort, normal  respiratory rate GI: Soft, nontender, non-distended  MS: Non pitting edema;  moves all extremities Integument: Skin feels warm Neuro:  At time of evaluation, alert and oriented to person/place/time/situation  Psych: Normal affect, patient feels ok   Labs    Chemistry Recent Labs  Lab 11/16/22 0456 11/17/22 0514 11/18/22 0443  NA 136 137 137  K 3.8 3.9 4.2  CL 96* 97* 97*  CO2 '30 29 29  '$ GLUCOSE 181* 239* 245*  BUN '12 13 12  '$ CREATININE 0.81 0.86 0.87  CALCIUM 8.3* 8.5* 8.8*  PROT 6.6 7.2 7.2  ALBUMIN 3.2* 3.3* 3.1*  AST 24 39 46*  ALT '25 27 27  '$ ALKPHOS 70 94 99  BILITOT 1.4* 1.0 1.0  GFRNONAA >60 >60 >60  ANIONGAP '10 11 11     '$ Hematology Recent Labs  Lab 11/17/22 0514 11/18/22 0443 11/19/22 0543  WBC 10.0 13.6* 12.7*  RBC 4.81 4.81 4.64  HGB 9.8* 9.6* 9.4*  HCT 36.1 35.9* 35.3*  MCV 75.1* 74.6* 76.1*  MCH 20.4* 20.0* 20.3*  MCHC 27.1* 26.7* 26.6*  RDW 19.4* 19.6* 19.6*  PLT 330 348 346    Cardiac EnzymesNo results for input(s): "TROPONINI" in the last 168 hours. No results for input(s): "TROPIPOC" in the last 168 hours.   BNP Recent Labs  Lab 11/13/22 1758  BNP 228.6*     DDimer  Recent Labs  Lab 11/13/22 1829  DDIMER 1.21*     Radiology    ECHO TEE  Result Date: 11/18/2022    TRANSESOPHOGEAL ECHO REPORT   Patient Name:   [name] Date of Exam: 11/18/2022 Medical Rec #:  [id number]   Height:       62.0 in Accession #:    [id number]  Weight:       327.6 lb Date of Birth:  01-Feb-1971   BSA:          2.358 m Patient Age:    17 years    BP:           112/74 mmHg Patient Gender: F           HR:           146 bpm. Exam Location:  Inpatient Procedure: Transesophageal Echo, Cardiac Doppler and Color Doppler Indications:     ; I48.92* Unspecified atrial flutter  History:         Patient has prior history of Echocardiogram examinations, most                  recent 11/14/2022. Abnormal ECG, Arrythmias:Atrial Flutter; Risk                   Factors:Diabetes and Hypertension.  Sonographer:     Roseanna Rainbow RDCS Referring Phys:  [id number] HAO MENG Diagnosing Phys: Skeet Latch MD PROCEDURE: After discussion of the risks and benefits of a TEE, an informed consent was obtained from the patient. The transesophogeal probe was passed without difficulty through the esophogus of the patient. Imaged were obtained with the patient in a left lateral decubitus position. Sedation performed by different physician. The patient was monitored while under deep sedation. Anesthestetic sedation was provided intravenously by Anesthesiology: '905mg'$  of Propofol. The patient's vital signs; including heart rate, blood pressure, and oxygen saturation; remained stable throughout the procedure. The patient developed no complications during the procedure. A successful direct current cardioversion was performed at 200 joules with 1 attempt.  IMPRESSIONS  1. Left ventricular ejection fraction, by estimation, is 30 to 35%. The left ventricle has moderately decreased function. The left ventricle demonstrates global hypokinesis.  2. Right ventricular systolic function is normal. The right ventricular size is normal.  3. No left atrial/left atrial appendage thrombus was detected.  4. The mitral valve is normal in structure. Trivial mitral valve regurgitation. No evidence of mitral stenosis.  5. The aortic valve is normal in structure. Aortic valve regurgitation is not visualized. No aortic stenosis is present.  6. The inferior vena cava is normal in size with greater than 50% respiratory variability, suggesting right atrial pressure of 3 mmHg.  7. Small PFO with left to right shunting at baseline. Bubble study was not obtained due to tenuous IV and patient respiratory status. There is a small patent foramen ovale with predominantly left to right shunting across the atrial septum. FINDINGS  Left Ventricle: Left ventricular ejection fraction, by estimation, is 30 to 35%. The  left ventricle has moderately decreased function. The left ventricle demonstrates global hypokinesis. The left ventricular internal cavity size was normal in size. There is no left ventricular hypertrophy. Right Ventricle: The right ventricular size is normal. No increase in right ventricular wall thickness. Right ventricular systolic function is normal. Left Atrium: Left atrial size was normal in size. No left atrial/left atrial appendage thrombus was detected. Right  Atrium: Right atrial size was normal in size. Pericardium: There is no evidence of pericardial effusion. Mitral Valve: The mitral valve is normal in structure. Trivial mitral valve regurgitation. No evidence of mitral valve stenosis. Tricuspid Valve: The tricuspid valve is normal in structure. Tricuspid valve regurgitation is trivial. No evidence of tricuspid stenosis. Aortic Valve: The aortic valve is normal in structure. Aortic valve regurgitation is not visualized. No aortic stenosis is present. Pulmonic Valve: The pulmonic valve was normal in structure. Pulmonic valve regurgitation is not visualized. No evidence of pulmonic stenosis. Aorta: The aortic root is normal in size and structure. Venous: The inferior vena cava is normal in size with greater than 50% respiratory variability, suggesting right atrial pressure of 3 mmHg. IAS/Shunts: No atrial level shunt detected by color flow Doppler. A small patent foramen ovale is detected with predominantly left to right shunting across the atrial septum. Small PFO with left to right shunting at baseline. Bubble study was not obtained due to tenuous IV and patient respiratory status. Additional Comments: Spectral Doppler performed. TRICUSPID VALVE TR Peak grad:   14.9 mmHg TR Vmax:        193.00 cm/s Skeet Latch MD Electronically signed by Skeet Latch MD Signature Date/Time: 11/18/2022/2:25:17 PM    Final      Patient Profile     52 y.o. 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  - Will stop IV diltiazem - increase coreg to 12.5 mg PO BID - ARNI 24/26 - SGLT2i deferred to outpatient start - aldactone 12.5 mg PO Daily  Microcytic anemia Morbid Obesity - Hgb 9.4, Ferritin 14 - work up per primary - given recent DCCV and AF and HF; discussed with primary team; will get IV iron prior to DC  Persistent Atrial Flutter - CHADSVASC=4. - with HTN, DM, and HF - Continue anticoagulation with eliquis; Acquired Thrombophilia - has OSA- discussed CPAP compliance - if goes back into AF will need to discuss AAD therapy  She is encouraged to set up PCP referral for some supportive needs (handicap placard). No wheeze presently, has distant history of smoking; if no improvement on GDMT would offer   Warrior will sign off.   Medication Recommendations:  Coreg, Entresto, and aldactone, new eliquis Other recommendations (labs, testing, etc):    Working to schedule outpatient follow up:  at outpatient visit Needs Home sleep study (does not have CPAP Would be reasonable for GLP-1 therapy given new DM and obesity that affects the above Would be reasonable for SGLT2i start  Follow up as an outpatient:  Dr. Johnsie Cancel or team     WEEKEND ROUNDING NOTE  For questions or updates, please contact [facility] Please consult [email or web address] for contact info under Cardiology/STEMI.      Rudean Haskell, MD F[facility] [address] [place], [place] [address] [phone]  7:29 AM

## 2022-11-19 NOTE — Progress Notes (Signed)
   11/18/22 1946  Vitals  Temp 98.6 F (37 C)  Temp Source Oral  BP 114/64  MAP (mmHg) 79  BP Location Right Arm  BP Method Automatic  Patient Position (if appropriate) Sitting  Pulse Rate 100  Pulse Rate Source Monitor  Resp 19  MEWS COLOR  MEWS Score Color Green  Oxygen Therapy  SpO2 94 %  O2 Device Room Air  Pain Assessment  Pain Scale 0-10  Pain Score 0  MEWS Score  MEWS Temp 0  MEWS Systolic 0  MEWS Pulse 0  MEWS RR 0  MEWS LOC 0  MEWS Score 0   pt had Cardizem drip before [facility] doing electrical cardioversion, and when pt came back about 5 pm without Cardizem drip running. Pt's HR will increased to 110s when she went to bathroom. Message on-call, obtained 12 lead EKG order. Cardizem drip order on hold per on-call stated.

## 2022-11-19 NOTE — Progress Notes (Addendum)
Pt placed on overnight pulse ox. Encouraged pt not to remove the pulse ox. RN aware. Pt is not on any type of oxygen at this time and O2 saturation is 97-98%. We may need to place her o2 later. RN aware to let RT know if she does place pt o2. Pt refused cpap tonight.

## 2022-11-19 NOTE — Progress Notes (Signed)
Patient's oxygen desaturated to 87% when sleeping.  2 Liters of Oxygen was applied.

## 2022-11-19 NOTE — Progress Notes (Signed)
[facility]                                                                               Deseri Sachdev, is a 52 y.o. 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 Came to [facility] ED 2/18 to 2:1 flutter vagal maneuver unsuccessful . She was found to be in atrial flutter. Cardiology consulted and she was started on IV cardizem gtt. She underwent TEE/ DCCV ON 11/19/22. She converted    Assessment & Plan    Assessment and Plan:  Atrial flutter converted to sinus tachycardia with TEE/ DCCV on 11/19/22. Still tachycardic, with rates in 110/min. Continue with coreg 12.5 mg BID.  Echocardiogram showed 30 to 35%. The  left ventricle has moderately decreased function. The left ventricle  demonstrates global hypokinesis. Left ventricular diastolic function could  not be evaluated.  Continue to monitor.    Acute systolic heart failure:  Euvolemic. Discontinued the IV lasix. Started on Entresto 24-26 mg BID and spironolactone.     Iron deficiency anemia:  Microcytic anemia.  No plans for colonoscopy inpatient  per GI. Outpatient colonoscopy due to multiple co morbidities.  S/p EGD on 11/15/2022, no bleeding seen. EGD wnl. Stool for occult blood is negative.  Continue with PPI daily.    OSA  Not on CPAP at home, she will need sleep study on discharge. Please check nocturnal oxygen levels to see if she will meet the criteria for QHS oxygen.     Hypertension;  Well controlled.  Resume the entresto, spironolactone and coreg.     H/o SVT: Converted by  adenosine.    Type 2 DM: uncontrolled with hyperglycemia.  - Hemoglobin A1c is 9.3%. CBG (last 3)  Recent Labs    11/19/22 0756  11/19/22 1159 11/19/22 1621  GLUCAP 184* 218* 194*   Not well controlled. Started her on semglee 10 units daily. Hemoglobin A1c is 9.3%.  Resume SSI.   Depression  Resume cymbalta .  Body mass index is 58.47 kg/m. Morbid obesity. Outpatient follow up with PCP.    Estimated body mass index is 58.47 kg/m as calculated from the following:   Height as of this encounter: '5\' 2"'$  (1.575 m).   Weight as of this encounter: 145 kg.  Code Status: full code.  DVT Prophylaxis:   apixaban (ELIQUIS) tablet 5 mg   Level of Care: Level of care: Progressive Family Communication: none at bedside.   Disposition Plan:     possible d/c in am.   Procedures:  Echocardiogram.  TEE and cardioversion scheduled on 2/23/ 24  Consultants:   Cardiology Gastroenterology.   Antimicrobials:   Anti-infectives (From admission, onward)    None        Medications  Scheduled Meds:  apixaban  5 mg Oral BID   carvedilol  12.5 mg Oral BID WC  DULoxetine  60 mg Oral Daily   furosemide  20 mg Intravenous Daily   insulin aspart  0-9 Units Subcutaneous TID WC   insulin glargine-yfgn  10 Units Subcutaneous Daily   metFORMIN  500 mg Oral BID WC   pantoprazole  40 mg Oral Q0600   pneumococcal 20-valent conjugate vaccine  0.5 mL Intramuscular Tomorrow-1000   sacubitril-valsartan  1 tablet Oral BID   spironolactone  12.5 mg Oral Daily   Continuous Infusions:   PRN Meds:.acetaminophen **OR** acetaminophen, HYDROcodone-acetaminophen, mouth rinse    Subjective:   [name] was seen and examined today.  No new complaints.  Objective:   Vitals:   11/19/22 0758 11/19/22 1313 11/19/22 1540 11/19/22 1545  BP:  (!) 111/59    Pulse:  (!) 102    Resp:  16    Temp:  98.6 F (37 C)    TempSrc:  Oral    SpO2:  97% (!) 86% 98%  Weight: (!) 145 kg     Height:        Intake/Output Summary (Last 24 hours) at 11/19/2022 1747 Last data filed at 11/19/2022 1243 Gross per 24 hour  Intake 1094.35 ml   Output 1945 ml  Net -850.65 ml    Filed Weights   11/17/22 0428 11/18/22 0500 11/19/22 0758  Weight: (!) 149.1 kg (!) 148.6 kg (!) 145 kg     Exam General exam: Appears calm and comfortable  Respiratory system: Clear to auscultation. Respiratory effort normal. Cardiovascular system: S1 & S2 heard, tachycardia. Marland Kitchen No JVD, murmurs,  Gastrointestinal system: Abdomen is nondistended, soft and nontender.  Central nervous system: Alert and oriented. No focal neurological deficits. Extremities: Symmetric 5 x 5 power. Skin: No rashes, lesions or ulcers Psychiatry: Mood & affect appropriate.      Data Reviewed:  I have personally reviewed following labs and imaging studies   CBC Lab Results  Component Value Date   WBC 12.7 (H) 11/19/2022   RBC 4.64 11/19/2022   HGB 9.4 (L) 11/19/2022   HCT 35.3 (L) 11/19/2022   MCV 76.1 (L) 11/19/2022   MCH 20.3 (L) 11/19/2022   PLT 346 11/19/2022   MCHC 26.6 (L) 11/19/2022   RDW 19.6 (H) 11/19/2022   LYMPHSABS 2.0 11/13/2022   MONOABS 0.5 11/13/2022   EOSABS 0.2 11/13/2022   BASOSABS 0.0 123456     Last metabolic panel Lab Results  Component Value Date   NA 137 11/18/2022   K 4.2 11/18/2022   CL 97 (L) 11/18/2022   CO2 29 11/18/2022   BUN 12 11/18/2022   CREATININE 0.87 11/18/2022   GLUCOSE 245 (H) 11/18/2022   GFRNONAA >60 11/18/2022   GFRAA >60 10/29/2019   CALCIUM 8.8 (L) 11/18/2022   PHOS 3.4 11/14/2022   PROT 7.2 11/18/2022   ALBUMIN 3.1 (L) 11/18/2022   BILITOT 1.0 11/18/2022   ALKPHOS 99 11/18/2022   AST 46 (H) 11/18/2022   ALT 27 11/18/2022   ANIONGAP 11 11/18/2022    CBG (last 3)  Recent Labs    11/19/22 0756 11/19/22 1159 11/19/22 1621  GLUCAP 184* 218* 194*       Coagulation Profile: Recent Labs  Lab 11/13/22 2055 11/18/22 1711  INR 1.4* 1.3*      Radiology Studies: ECHO TEE  Result Date: 11/18/2022    TRANSESOPHOGEAL ECHO REPORT   Patient Name:   DNIYA OURSLER Date of Exam: 11/18/2022  Medical Rec #:  [id number]   Height:  62.0 in Accession #:    [id number]  Weight:       327.6 lb Date of Birth:  1971/03/13   BSA:          2.358 m Patient Age:    9 years    BP:           112/74 mmHg Patient Gender: F           HR:           146 bpm. Exam Location:  Inpatient Procedure: Transesophageal Echo, Cardiac Doppler and Color Doppler Indications:     ; I48.92* Unspecified atrial flutter  History:         Patient has prior history of Echocardiogram examinations, most                  recent 11/14/2022. Abnormal ECG, Arrythmias:Atrial Flutter; Risk                  Factors:Diabetes and Hypertension.  Sonographer:     Roseanna Rainbow RDCS Referring Phys:  [id number] HAO MENG Diagnosing Phys: Skeet Latch MD PROCEDURE: After discussion of the risks and benefits of a TEE, an informed consent was obtained from the patient. The transesophogeal probe was passed without difficulty through the esophogus of the patient. Imaged were obtained with the patient in a left lateral decubitus position. Sedation performed by different physician. The patient was monitored while under deep sedation. Anesthestetic sedation was provided intravenously by Anesthesiology: '905mg'$  of Propofol. The patient's vital signs; including heart rate, blood pressure, and oxygen saturation; remained stable throughout the procedure. The patient developed no complications during the procedure. A successful direct current cardioversion was performed at 200 joules with 1 attempt.  IMPRESSIONS  1. Left ventricular ejection fraction, by estimation, is 30 to 35%. The left ventricle has moderately decreased function. The left ventricle demonstrates global hypokinesis.  2. Right ventricular systolic function is normal. The right ventricular size is normal.  3. No left atrial/left atrial appendage thrombus was detected.  4. The mitral valve is normal in structure. Trivial mitral valve regurgitation. No evidence of mitral stenosis.  5. The aortic valve is  normal in structure. Aortic valve regurgitation is not visualized. No aortic stenosis is present.  6. The inferior vena cava is normal in size with greater than 50% respiratory variability, suggesting right atrial pressure of 3 mmHg.  7. Small PFO with left to right shunting at baseline. Bubble study was not obtained due to tenuous IV and patient respiratory status. There is a small patent foramen ovale with predominantly left to right shunting across the atrial septum. FINDINGS  Left Ventricle: Left ventricular ejection fraction, by estimation, is 30 to 35%. The left ventricle has moderately decreased function. The left ventricle demonstrates global hypokinesis. The left ventricular internal cavity size was normal in size. There is no left ventricular hypertrophy. Right Ventricle: The right ventricular size is normal. No increase in right ventricular wall thickness. Right ventricular systolic function is normal. Left Atrium: Left atrial size was normal in size. No left atrial/left atrial appendage thrombus was detected. Right Atrium: Right atrial size was normal in size. Pericardium: There is no evidence of pericardial effusion. Mitral Valve: The mitral valve is normal in structure. Trivial mitral valve regurgitation. No evidence of mitral valve stenosis. Tricuspid Valve: The tricuspid valve is normal in structure. Tricuspid valve regurgitation is trivial. No evidence of tricuspid stenosis. Aortic Valve: The aortic valve is normal in structure. Aortic valve regurgitation is not visualized.  No aortic stenosis is present. Pulmonic Valve: The pulmonic valve was normal in structure. Pulmonic valve regurgitation is not visualized. No evidence of pulmonic stenosis. Aorta: The aortic root is normal in size and structure. Venous: The inferior vena cava is normal in size with greater than 50% respiratory variability, suggesting right atrial pressure of 3 mmHg. IAS/Shunts: No atrial level shunt detected by color flow  Doppler. A small patent foramen ovale is detected with predominantly left to right shunting across the atrial septum. Small PFO with left to right shunting at baseline. Bubble study was not obtained due to tenuous IV and patient respiratory status. Additional Comments: Spectral Doppler performed. TRICUSPID VALVE TR Peak grad:   14.9 mmHg TR Vmax:        193.00 cm/s Skeet Latch MD Electronically signed by Skeet Latch MD Signature Date/Time: 11/18/2022/2:25:17 PM    Final        Hosie Poisson M.D. [facility] 11/19/2022, 5:47 PM  Available via Epic secure chat 7am-7pm After 7 pm, please refer to night coverage provider listed on amion.

## 2022-11-20 DIAGNOSIS — I10 Essential (primary) hypertension: Secondary | ICD-10-CM | POA: Diagnosis not present

## 2022-11-20 DIAGNOSIS — E119 Type 2 diabetes mellitus without complications: Secondary | ICD-10-CM | POA: Diagnosis not present

## 2022-11-20 DIAGNOSIS — I4892 Unspecified atrial flutter: Secondary | ICD-10-CM | POA: Diagnosis not present

## 2022-11-20 LAB — CBC
HCT: 36 % (ref 36.0–46.0)
Hemoglobin: 9.7 g/dL — ABNORMAL LOW (ref 12.0–15.0)
MCH: 20.4 pg — ABNORMAL LOW (ref 26.0–34.0)
MCHC: 26.9 g/dL — ABNORMAL LOW (ref 30.0–36.0)
MCV: 75.6 fL — ABNORMAL LOW (ref 80.0–100.0)
Platelets: 341 10*3/uL (ref 150–400)
RBC: 4.76 MIL/uL (ref 3.87–5.11)
RDW: 19.3 % — ABNORMAL HIGH (ref 11.5–15.5)
WBC: 12.2 10*3/uL — ABNORMAL HIGH (ref 4.0–10.5)
nRBC: 0 % (ref 0.0–0.2)

## 2022-11-20 LAB — GLUCOSE, CAPILLARY
Glucose-Capillary: 171 mg/dL — ABNORMAL HIGH (ref 70–99)
Glucose-Capillary: 187 mg/dL — ABNORMAL HIGH (ref 70–99)
Glucose-Capillary: 190 mg/dL — ABNORMAL HIGH (ref 70–99)
Glucose-Capillary: 192 mg/dL — ABNORMAL HIGH (ref 70–99)
Glucose-Capillary: 218 mg/dL — ABNORMAL HIGH (ref 70–99)

## 2022-11-20 MED ORDER — BLOOD GLUCOSE TEST VI STRP: 1.0000 | ORAL_STRIP | Freq: Three times a day (TID) | 0 refills | Status: AC

## 2022-11-20 MED ORDER — APIXABAN 5 MG PO TABS: 5.0000 mg | ORAL_TABLET | Freq: Two times a day (BID) | ORAL | 2 refills | Status: DC

## 2022-11-20 MED ORDER — LANCETS MISC. MISC: 1.0000 | Freq: Three times a day (TID) | 0 refills | Status: AC

## 2022-11-20 MED ORDER — NOVOLIN 70/30 FLEXPEN RELION (70-30) 100 UNIT/ML ~~LOC~~ SUPN: 15.0000 [IU] | PEN_INJECTOR | Freq: Two times a day (BID) | SUBCUTANEOUS | 11 refills | Status: AC

## 2022-11-20 MED ORDER — BLOOD GLUCOSE MONITORING SUPPL DEVI: 1.0000 | Freq: Three times a day (TID) | 0 refills | Status: DC

## 2022-11-20 MED ORDER — INSULIN GLARGINE-YFGN 100 UNIT/ML ~~LOC~~ SOLN: 10.0000 [IU] | Freq: Every day | SUBCUTANEOUS | 11 refills | Status: DC

## 2022-11-20 MED ORDER — ALBUTEROL SULFATE HFA 108 (90 BASE) MCG/ACT IN AERS: 2.0000 | INHALATION_SPRAY | Freq: Four times a day (QID) | RESPIRATORY_TRACT | 2 refills | Status: AC | PRN

## 2022-11-20 MED ORDER — LANCET DEVICE MISC: 1.0000 | Freq: Three times a day (TID) | 0 refills | Status: AC

## 2022-11-20 MED ORDER — SPIRONOLACTONE 25 MG PO TABS: 12.5000 mg | ORAL_TABLET | Freq: Every day | ORAL | 2 refills | Status: DC

## 2022-11-20 MED ORDER — CARVEDILOL 12.5 MG PO TABS: 12.5000 mg | ORAL_TABLET | Freq: Two times a day (BID) | ORAL | 2 refills | Status: DC

## 2022-11-20 MED ORDER — INSULIN ASPART 100 UNIT/ML FLEXPEN: PEN_INJECTOR | SUBCUTANEOUS | 11 refills | Status: AC

## 2022-11-20 MED ORDER — "PEN NEEDLES 3/16"" 31G X 5 MM MISC": 100.0000 | Freq: Two times a day (BID) | 10 refills | Status: DC

## 2022-11-20 MED ORDER — SACUBITRIL-VALSARTAN 24-26 MG PO TABS: 1.0000 | ORAL_TABLET | Freq: Two times a day (BID) | ORAL | 2 refills | Status: DC

## 2022-11-20 MED ORDER — METFORMIN HCL 500 MG PO TABS: 500.0000 mg | ORAL_TABLET | Freq: Two times a day (BID) | ORAL | 2 refills | Status: DC

## 2022-11-20 MED ORDER — INSULIN ASPART 100 UNIT/ML FLEXPEN: PEN_INJECTOR | SUBCUTANEOUS | 11 refills | Status: DC

## 2022-11-20 NOTE — Progress Notes (Signed)
Discharge instructions reviewed with patient including monitoring of blood sugars, insulin administration, and oxygen use. Pt verbalized understanding. Patient given 30 day coupons for eliquis and entresto.

## 2022-11-20 NOTE — Progress Notes (Addendum)
Pt's O2 sat back and forth between 79% to 93%. O2 2L placed. O2 sat 96-99%.  Around 0225, pt removed Logan, and O2 drop to 80-90%. RN re-applied the 2L O2 on pt and O2 sat above 90%.

## 2022-11-20 NOTE — Progress Notes (Signed)
Follow up with cardiology scheduled on 12/02/22 , see AVS

## 2022-11-20 NOTE — TOC Progression Note (Addendum)
Transition of Care [facility]) - Progression Note    Patient Details  Name: Debbie Lee MRN: [id number] Date of Birth: September 19, 1971  [facility]) CM/SW Contact  Servando Snare, [place] Phone Number: 11/20/2022, 10:54 AM  Clinical Narrative:    Transition of Care [facility]) Screening Note   Patient Details  Name: Debbie Lee Date of Birth: [birth year]   Transition of Care [facility]) CM/SW Contact:    Servando Snare, LCSW Phone Number: 11/20/2022, 10:54 AM   Following for o2 needs      Expected Discharge Plan: Home/Self Care Barriers to Discharge: Continued Medical Work up  Expected Discharge Plan and Services                                               Social Determinants of Health (SDOH) Interventions SDOH Screenings   Food Insecurity: No Food Insecurity (11/17/2022)  Recent Concern: Food Insecurity - Food Insecurity Present (11/16/2022)  Housing: Medium Risk (11/16/2022)  Transportation Needs: No Transportation Needs (11/16/2022)  Recent Concern: Transportation Needs - Unmet Transportation Needs (11/14/2022)  Utilities: Not At Risk (11/16/2022)  Alcohol Screen: Low Risk  (11/16/2022)  Financial Resource Strain: High Risk (11/16/2022)  Tobacco Use: Medium Risk (11/18/2022)    Readmission Risk Interventions     No data to display

## 2022-11-20 NOTE — Progress Notes (Signed)
Removed pt from Overnight pulse ox. Test results will be placed in pts chart.

## 2022-11-20 NOTE — Progress Notes (Addendum)
SATURATION QUALIFICATIONS: (This note is used to comply with regulatory documentation for home oxygen)  Patient Saturations on Room Air at Rest = 96%  Patient Saturations on Room Air while Ambulating = 90%  Patient Saturations on 2 Liters of oxygen while Ambulating = 96%  Patients Saturations while sleeping=80%  Patients Saturations on 2 Liters of oxygen while sleeping= 92%   Please briefly explain why patient needs home oxygen:Patient becomes hypoxic upon ambulating and while sleeping and is requiring O2 to maintain saturation above 92%.

## 2022-11-20 NOTE — TOC Transition Note (Signed)
Transition of Care [facility]) - CM/SW Discharge Note   Patient Details  Name: Debbie Lee MRN: [id number] Date of Birth: July 03, 1971  Transition of [facility] CM/SW Contact:  Servando Snare, LCSW Phone Number: 11/20/2022, 12:23 PM   Clinical Narrative:   O2 ordered through [employer]    Final next level of care: Home/Self Care Barriers to Discharge: No Barriers Identified   Patient Goals and CMS Choice   Choice offered to / list presented to : NA  Discharge Placement                         Discharge Plan and Services Additional resources added to the After Visit Summary for                  DME Arranged: Oxygen DME Agency: [employer] Date DME Agency Contacted: 11/20/22 Time DME Agency Contacted: 1223 Representative spoke with at DME Agency: Weekend rep            Social Determinants of Health (SDOH) Interventions SDOH Screenings   Food Insecurity: No Food Insecurity (11/17/2022)  Recent Concern: Food Insecurity - Food Insecurity Present (11/16/2022)  Housing: Medium Risk (11/16/2022)  Transportation Needs: No Transportation Needs (11/16/2022)  Recent Concern: Transportation Needs - Unmet Transportation Needs (11/14/2022)  Utilities: Not At Risk (11/16/2022)  Alcohol Screen: Low Risk  (11/16/2022)  Financial Resource Strain: High Risk (11/16/2022)  Tobacco Use: Medium Risk (11/18/2022)     Readmission Risk Interventions     No data to display

## 2022-11-21 ENCOUNTER — Encounter (HOSPITAL_COMMUNITY): Payer: Self-pay | Admitting: Cardiovascular Disease

## 2022-11-21 LAB — GLUCOSE, CAPILLARY: Glucose-Capillary: 163 mg/dL — ABNORMAL HIGH (ref 70–99)

## 2022-11-21 NOTE — Discharge Summary (Signed)
Physician Discharge Summary   Patient: Debbie Lee MRN: [id number] DOB: May 23, 1971  Admit date:     11/13/2022  Discharge date: 11/20/2022  Discharge Physician: Hosie Poisson   PCP: Azzie Glatter, FNP   Recommendations at discharge:  Please follow up with pulmonology for sleep studies.  Please follow up with cbc and bmp in one week.  Please follow up with cardiology as scheduled.  Please follow up with PCP in one week.   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 Came to [facility] ED 2/18 to 2:1 flutter vagal maneuver unsuccessful . She was found to be in atrial flutter. Cardiology consulted and she was started on IV cardizem gtt. She underwent TEE/ DCCV ON 11/19/22. She converted  to sinus rhythm.   Assessment and Plan:  Atrial flutter converted to sinus tachycardia with TEE/ DCCV on 11/19/22. Still tachycardic, with rates in 110/min. Continue with coreg 12.5 mg BID.  Echocardiogram showed 30 to 35%. The  left ventricle has moderately decreased function. The left ventricle  demonstrates global hypokinesis. Left ventricular diastolic function could  not be evaluated.  Continue to monitor.      Acute systolic heart failure:  Euvolemic. Discontinued the IV lasix. Started on Entresto 24-26 mg BID and spironolactone.        Iron deficiency anemia:  Microcytic anemia.  No plans for colonoscopy inpatient  per GI. Outpatient colonoscopy due to multiple co morbidities.  S/p EGD on 11/15/2022, no bleeding seen. EGD wnl. Stool for occult blood is negative.  Continue with  PPI daily.      OSA  Not on CPAP at home, she will need sleep study on discharge. She qualified for nocturnal oxygen and discharged on the same.        Hypertension;  Well controlled.  Resume the entresto, spironolactone and coreg.        H/o SVT: Converted by  adenosine.      Type 2 DM: uncontrolled with hyperglycemia.  - Hemoglobin A1c is 9.3%. CBG (last 3)  Recent Labs    11/20/22 0725 11/20/22 1127 11/20/22 1616  GLUCAP 187* 218* 192*    Not well controlled. Discharged her on novolin 70/30 mmhg. 15 units BID and novolog flex pen for SSI.    Depression  Resume cymbalta .   Body mass index is 58.47 kg/m. Morbid obesity. Outpatient follow up with PCP.      Estimated body mass index is 58.47 kg/m as calculated from the following:   Height as of this encounter: '5\' 2"'$  (1.575 m).   Weight as of this encounter: 145 kg.  Consultants: cardiology.  Procedures performed: echo TEE dccv  Disposition: Home Diet recommendation:  Discharge Diet Orders (From admission, onward)     Start     Ordered   11/20/22 0000  Diet - low sodium heart healthy        11/20/22 1152           Cardiac diet DISCHARGE MEDICATION: Allergies as of 11/20/2022       Reactions   Codeine Other (See  Comments)   "Hallucinations. Narcotics cause vomiting."   Sulfa Antibiotics Other (See Comments)   "lifelong allergy"        Medication List     STOP taking these medications    aspirin EC 81 MG tablet   diltiazem 180 MG 24 hr capsule Commonly known as: Cardizem CD   docusate sodium 100 MG capsule Commonly known as: Colace   methocarbamol 500 MG tablet Commonly known as: Robaxin   metoprolol tartrate 25 MG tablet Commonly known as: LOPRESSOR   norgestimate-ethinyl estradiol 0.25-35 MG-MCG tablet Commonly known as: ORTHO-CYCLEN   ondansetron 4 MG disintegrating tablet Commonly known as: Zofran ODT   propranolol 40 MG tablet Commonly known as: INDERAL        TAKE these medications    albuterol 108 (90 Base) MCG/ACT inhaler Commonly known as: VENTOLIN HFA Inhale 2 puffs into the lungs every 6 (six) hours as needed for wheezing or shortness of breath.   apixaban 5 MG Tabs tablet Commonly known as: ELIQUIS Take 1 tablet (5 mg total) by mouth 2 (two) times daily.   Blood Glucose Monitoring Suppl Devi 1 each by Does not apply route in the morning, at noon, and at bedtime. May substitute to any manufacturer covered by patient's insurance.   BLOOD GLUCOSE TEST STRIPS Strp 1 each by In Vitro route in the morning, at noon, and at bedtime. May substitute to any manufacturer covered by patient's insurance.   carvedilol 12.5 MG tablet Commonly known as: COREG Take 1 tablet (12.5 mg total) by mouth 2 (two) times daily with a meal.   DULoxetine 60 MG capsule Commonly known as: CYMBALTA Take 60 mg by mouth daily.   insulin aspart 100 UNIT/ML FlexPen Commonly known as: NOVOLOG CBG 70 - 120: 0 units  CBG 121 - 150: 1 unit  CBG 151 - 200: 2 units  CBG 201 - 250: 3 units  CBG 251 - 300: 5 units  CBG 301 - 350: 7 units  CBG 351 - 400: 9 units   Lancet Device Misc 1 each by Does not apply route in the morning, at noon, and at bedtime. May substitute to any manufacturer covered by patient's insurance.   Lancets Misc. Misc 1 each by Does not apply route in the morning, at noon, and at bedtime. May substitute to any manufacturer covered by patient's insurance.   metFORMIN 500 MG tablet Commonly known as: GLUCOPHAGE Take 1 tablet (500 mg total) by mouth 2 (two) times daily with a meal.   NovoLIN 70/30 Kwikpen (70-30) 100 UNIT/ML KwikPen Generic drug: insulin isophane & regular human KwikPen Inject 15 Units into the skin 2 (two) times daily.   omeprazole 20 MG capsule Commonly known as: PRILOSEC Take 20 mg by mouth daily as needed (indigestion).   Pen Needles 3/16" 31G X 5 MM Misc 100 each by Does not apply route 2 (two) times daily.    sacubitril-valsartan 24-26 MG Commonly known as: ENTRESTO Take 1 tablet by mouth 2 (two) times daily.   spironolactone 25 MG tablet Commonly known as: ALDACTONE Take 0.5 tablets (12.5 mg total) by mouth daily.        Follow-up Information     [facility]. Go to.   Specialty: Cardiology Why: Tuesday, March 12 @ 11AM for [facility] appointment with Russell. Bring ALL MEDICATIONS with you. FREE Valet parking at [employer], off [address] at [employer] main campus. Contact information: [address] [id number] [place] [address] [place]        Otis Brace, MD. Schedule an appointment as soon as possible for a visit in 4 month(s).   Specialty: Gastroenterology Why: Evaluate for iron deficiency anemia Contact information: [place] [address] [phone]         Marylu Lund., NP Follow up on 12/02/2022.   Specialty: Cardiology Why: at 8:25am for your cardiology follow up appointment Contact information: [address] [place] [address] [phone]                Discharge Exam: Danley Danker Weights   11/18/22 0500 11/19/22 0758 11/20/22 0700  Weight: (!) 148.6 kg (!) 145 kg (!) 143.6 kg   General exam: Appears calm and comfortable  Respiratory system: Clear to auscultation. Respiratory effort normal. Cardiovascular system: S1 & S2 heard, RRR. No JVD,  No pedal edema. Gastrointestinal system: Abdomen is nondistended, soft and nontender.  Central nervous system: Alert and oriented. No focal neurological deficits. Extremities: Symmetric 5 x 5 power. Skin: No rashes, lesions or ulcers Psychiatry: Mood & affect appropriate.    Condition at discharge: fair  The results of significant diagnostics from this hospitalization (including imaging, microbiology, ancillary and laboratory) are listed below for reference.    Imaging Studies: ECHO TEE  Result Date: 11/18/2022    TRANSESOPHOGEAL ECHO REPORT   Patient Name:   Debbie Lee Date of Exam: 11/18/2022 Medical Rec #:  [id number]   Height:       62.0 in Accession #:    [id number]  Weight:       327.6 lb Date of Birth:  1971/06/21   BSA:          2.358 m Patient Age:    20 years    BP:           112/74 mmHg Patient Gender: F           HR:           146 bpm. Exam Location:  Inpatient Procedure: Transesophageal Echo, Cardiac Doppler and Color Doppler Indications:     ; I48.92* Unspecified atrial flutter  History:         Patient has prior history of Echocardiogram examinations, most                  recent 11/14/2022. Abnormal ECG, Arrythmias:Atrial Flutter; Risk                  Factors:Diabetes and Hypertension.  Sonographer:     Roseanna Rainbow RDCS Referring Phys:  [id number] HAO MENG Diagnosing Phys: Skeet Latch MD PROCEDURE: After discussion of the risks and benefits of a TEE, an informed consent was obtained from the patient. The transesophogeal probe was passed without difficulty through the esophogus of the patient. Imaged were obtained with the patient in a left lateral decubitus position. Sedation performed by different physician. The patient was monitored while under deep sedation. Anesthestetic sedation was provided intravenously by Anesthesiology: '905mg'$  of Propofol. The patient's vital signs; including heart rate, blood pressure, and oxygen saturation; remained stable throughout the procedure. The patient developed no complications during the procedure. A successful direct current cardioversion was performed at 200 joules with 1 attempt.  IMPRESSIONS  1. Left ventricular ejection fraction, by estimation, is 30 to 35%. The left ventricle has moderately decreased function. The left ventricle demonstrates global hypokinesis.  2. Right ventricular systolic function is normal.  The right ventricular size is normal.  3. No left atrial/left atrial appendage thrombus was  detected.  4. The mitral valve is normal in structure. Trivial mitral valve regurgitation. No evidence of mitral stenosis.  5. The aortic valve is normal in structure. Aortic valve regurgitation is not visualized. No aortic stenosis is present.  6. The inferior vena cava is normal in size with greater than 50% respiratory variability, suggesting right atrial pressure of 3 mmHg.  7. Small PFO with left to right shunting at baseline. Bubble study was not obtained due to tenuous IV and patient respiratory status. There is a small patent foramen ovale with predominantly left to right shunting across the atrial septum. FINDINGS  Left Ventricle: Left ventricular ejection fraction, by estimation, is 30 to 35%. The left ventricle has moderately decreased function. The left ventricle demonstrates global hypokinesis. The left ventricular internal cavity size was normal in size. There is no left ventricular hypertrophy. Right Ventricle: The right ventricular size is normal. No increase in right ventricular wall thickness. Right ventricular systolic function is normal. Left Atrium: Left atrial size was normal in size. No left atrial/left atrial appendage thrombus was detected. Right Atrium: Right atrial size was normal in size. Pericardium: There is no evidence of pericardial effusion. Mitral Valve: The mitral valve is normal in structure. Trivial mitral valve regurgitation. No evidence of mitral valve stenosis. Tricuspid Valve: The tricuspid valve is normal in structure. Tricuspid valve regurgitation is trivial. No evidence of tricuspid stenosis. Aortic Valve: The aortic valve is normal in structure. Aortic valve regurgitation is not visualized. No aortic stenosis is present. Pulmonic Valve: The pulmonic valve was normal in structure. Pulmonic valve regurgitation is not visualized. No evidence of pulmonic stenosis. Aorta: The aortic root is normal in size and structure. Venous: The inferior vena cava is normal in size with  greater than 50% respiratory variability, suggesting right atrial pressure of 3 mmHg. IAS/Shunts: No atrial level shunt detected by color flow Doppler. A small patent foramen ovale is detected with predominantly left to right shunting across the atrial septum. Small PFO with left to right shunting at baseline. Bubble study was not obtained due to tenuous IV and patient respiratory status. Additional Comments: Spectral Doppler performed. TRICUSPID VALVE TR Peak grad:   14.9 mmHg TR Vmax:        193.00 cm/s Skeet Latch MD Electronically signed by Skeet Latch MD Signature Date/Time: 11/18/2022/2:25:17 PM    Final    DG Chest 2 View  Result Date: 11/16/2022 CLINICAL DATA:  Pleural effusion. EXAM: CHEST - 2 VIEW COMPARISON:  CXR 11/13/22 FINDINGS: No pleural effusion is visualized. No pneumothorax. No focal airspace opacity. Unchanged prominent cardiac contours. Visualized upper abdomen is unremarkable. No radiographically apparent displaced rib fractures. IMPRESSION: No pleural effusion.  No focal airspace opacity. Electronically Signed   By: Marin Roberts M.D.   On: 11/16/2022 14:24   VAS [place] LOWER EXTREMITY VENOUS (DVT)  Result Date: 11/14/2022  Lower Venous DVT Study Patient Name:  Debbie Lee  Date of Exam:   11/14/2022 Medical Rec #: [id number]    Accession #:    [id number] Date of Birth: May 03, 1971    Patient Gender: F Patient Age:   89 years Exam Location:  [facility] Procedure:      VAS [place] LOWER EXTREMITY VENOUS (DVT) Referring Phys: Nyoka Lint DOUTOVA --------------------------------------------------------------------------------  Indications: D-dimer 1.2.  Anticoagulation: Heparin. Limitations: Body habitus. Comparison Study: No prior studies. Performing Technologist: Darlin Coco RDMS, RVT  Examination Guidelines:  A complete evaluation includes B-mode imaging, spectral Doppler, color Doppler, and power Doppler as needed of all accessible portions of each vessel. Bilateral testing is  considered an integral part of a complete examination. Limited examinations for reoccurring indications may be performed as noted. The reflux portion of the exam is performed with the patient in reverse Trendelenburg.  +---------+---------------+---------+-----------+----------+-------------------+ RIGHT    CompressibilityPhasicitySpontaneityPropertiesThrombus Aging      +---------+---------------+---------+-----------+----------+-------------------+ CFV      Full           Yes      Yes                                      +---------+---------------+---------+-----------+----------+-------------------+ SFJ      Full                                                             +---------+---------------+---------+-----------+----------+-------------------+ FV Prox  Full                                                             +---------+---------------+---------+-----------+----------+-------------------+ FV Mid   Full                                                             +---------+---------------+---------+-----------+----------+-------------------+ FV DistalFull                                                             +---------+---------------+---------+-----------+----------+-------------------+ PFV      Full                                                             +---------+---------------+---------+-----------+----------+-------------------+ POP      Full           Yes      Yes                                      +---------+---------------+---------+-----------+----------+-------------------+ PTV      Full                                                             +---------+---------------+---------+-----------+----------+-------------------+ PERO  Not well visualized +---------+---------------+---------+-----------+----------+-------------------+    +---------+---------------+---------+-----------+----------+--------------+ LEFT     CompressibilityPhasicitySpontaneityPropertiesThrombus Aging +---------+---------------+---------+-----------+----------+--------------+ CFV      Full           Yes      Yes                                 +---------+---------------+---------+-----------+----------+--------------+ SFJ      Full                                                        +---------+---------------+---------+-----------+----------+--------------+ FV Prox  Full                                                        +---------+---------------+---------+-----------+----------+--------------+ FV Mid   Full                                                        +---------+---------------+---------+-----------+----------+--------------+ FV DistalFull                                                        +---------+---------------+---------+-----------+----------+--------------+ PFV      Full                                                        +---------+---------------+---------+-----------+----------+--------------+ POP      Full           Yes      Yes                                 +---------+---------------+---------+-----------+----------+--------------+ PTV      Full                                                        +---------+---------------+---------+-----------+----------+--------------+ PERO     Full                                                        +---------+---------------+---------+-----------+----------+--------------+     Summary: RIGHT: - There is no evidence of deep vein thrombosis in the lower extremity. However, portions of this examination were limited- see technologist comments above.  - No cystic structure found in the popliteal fossa.  LEFT: - There is no evidence of deep vein thrombosis in the lower extremity.  - No cystic structure found in the popliteal  fossa.  *See table(s) above for measurements and observations. Electronically signed by Servando Snare MD on 11/14/2022 at 4:27:56 PM.    Final    ECHOCARDIOGRAM COMPLETE  Result Date: 11/14/2022    ECHOCARDIOGRAM REPORT   Patient Name:   Debbie Lee Date of Exam: 11/14/2022 Medical Rec #:  [id number]   Height:       62.0 in Accession #:    [id number]  Weight:       310.0 lb Date of Birth:  Nov 11, 1970   BSA:          2.303 m Patient Age:    24 years    BP:           122/72 mmHg Patient Gender: F           HR:           74 bpm. Exam Location:  Inpatient Procedure: 2D Echo, Color Doppler and Cardiac Doppler Indications:    I48.92* Unspecified atrial flutter  History:        Patient has no prior history of Echocardiogram examinations.                 Risk Factors:Hypertension.  Sonographer:    Phineas Douglas Referring Phys: [id number] [place]  1. Left ventricular ejection fraction, by estimation, is 30 to 35%. The left ventricle has moderately decreased function. The left ventricle demonstrates global hypokinesis. Left ventricular diastolic function could not be evaluated.  2. Right ventricular systolic function is moderately reduced. The right ventricular size is normal. There is normal pulmonary artery systolic pressure.  3. Left atrial size was mildly dilated.  4. Right atrial size was mildly dilated.  5. The mitral valve is normal in structure. No evidence of mitral valve regurgitation. No evidence of mitral stenosis.  6. The aortic valve is tricuspid. Aortic valve regurgitation is not visualized. No aortic stenosis is present.  7. The inferior vena cava is dilated in size with >50% respiratory variability, suggesting right atrial pressure of 8 mmHg. FINDINGS  Left Ventricle: Left ventricular ejection fraction, by estimation, is 30 to 35%. The left ventricle has moderately decreased function. The left ventricle demonstrates global hypokinesis. The left ventricular internal cavity size was normal in  size. There is no left ventricular hypertrophy. Left ventricular diastolic function could not be evaluated due to atrial fibrillation. Left ventricular diastolic function could not be evaluated. Right Ventricle: The right ventricular size is normal. No increase in right ventricular wall thickness. Right ventricular systolic function is moderately reduced. There is normal pulmonary artery systolic pressure. The tricuspid regurgitant velocity is 2.08 m/s, and with an assumed right atrial pressure of 15 mmHg, the estimated right ventricular systolic pressure is [id number] mmHg. Left Atrium: Left atrial size was mildly dilated. Right Atrium: Right atrial size was mildly dilated. Pericardium: There is no evidence of pericardial effusion. Mitral Valve: The mitral valve is normal in structure. No evidence of mitral valve regurgitation. No evidence of mitral valve stenosis. Tricuspid Valve: The tricuspid valve is normal in structure. Tricuspid valve regurgitation is mild . No evidence of tricuspid stenosis. Aortic Valve: The aortic valve is tricuspid. Aortic valve regurgitation is not visualized. No aortic stenosis is present. Aortic valve mean gradient measures 6.0 mmHg. Aortic valve peak gradient measures 9.5 mmHg. Aortic valve area, by VTI measures 2.38 cm. Pulmonic Valve: The  pulmonic valve was normal in structure. Pulmonic valve regurgitation is not visualized. No evidence of pulmonic stenosis. Aorta: The aortic root is normal in size and structure. Venous: The inferior vena cava is dilated in size with greater than 50% respiratory variability, suggesting right atrial pressure of 8 mmHg. IAS/Shunts: No atrial level shunt detected by color flow Doppler.  LEFT VENTRICLE PLAX 2D LVIDd:         4.00 cm      Diastology LVIDs:         3.00 cm      LV e' medial:    10.80 cm/s LV PW:         1.10 cm      LV E/e' medial:  12.0 LV IVS:        1.20 cm      LV e' lateral:   15.20 cm/s LVOT diam:     2.10 cm      LV E/e' lateral: 8.6 LV  SV:         70 LV SV Index:   30 LVOT Area:     3.46 cm  LV Volumes (MOD) LV vol d, MOD A2C: 112.0 ml LV vol d, MOD A4C: 96.0 ml LV vol s, MOD A2C: 53.6 ml LV vol s, MOD A4C: 46.9 ml LV SV MOD A2C:     58.4 ml LV SV MOD A4C:     96.0 ml LV SV MOD BP:      54.3 ml RIGHT VENTRICLE             IVC RV Basal diam:  4.20 cm     IVC diam: 2.50 cm RV S prime:     10.30 cm/s TAPSE (M-mode): 1.7 cm LEFT ATRIUM             Index        RIGHT ATRIUM           Index LA diam:        3.80 cm 1.65 cm/m   RA Area:     19.60 cm LA Vol (A2C):   65.1 ml 28.27 ml/m  RA Volume:   52.10 ml  22.62 ml/m LA Vol (A4C):   57.5 ml 24.97 ml/m LA Biplane Vol: 62.1 ml 26.96 ml/m  AORTIC VALVE AV Area (Vmax):    2.21 cm AV Area (Vmean):   2.16 cm AV Area (VTI):     2.38 cm AV Vmax:           154.00 cm/s AV Vmean:          114.000 cm/s AV VTI:            0.292 m AV Peak Grad:      9.5 mmHg AV Mean Grad:      6.0 mmHg LVOT Vmax:         98.30 cm/s LVOT Vmean:        71.100 cm/s LVOT VTI:          0.201 m LVOT/AV VTI ratio: 0.69  AORTA Ao Root diam: 2.90 cm Ao Asc diam:  3.00 cm MITRAL VALVE                TRICUSPID VALVE MV Area (PHT): 5.16 cm     TR Peak grad:   17.3 mmHg MV Decel Time: 147 msec     TR Vmax:        208.00 cm/s MV E velocity: 130.00 cm/s  SHUNTS                             Systemic VTI:  0.20 m                             Systemic Diam: 2.10 cm Glori Bickers MD Electronically signed by Glori Bickers MD Signature Date/Time: 11/14/2022/4:01:22 PM    Final    DG Abd 1 View  Result Date: 11/14/2022 CLINICAL DATA:  History of constipation EXAM: ABDOMEN - 1 VIEW COMPARISON:  None Available. FINDINGS: Bladder is partially distended with opacified urine. Scattered large and small bowel gas is noted. No obstructive changes are seen. No significant fecal retention to suggest constipation is noted. No free air is seen. IMPRESSION: No acute abnormality noted. Electronically Signed   By: Inez Catalina  M.D.   On: 11/14/2022 00:05   CT Angio Chest PE W and/or Wo Contrast  Result Date: 11/13/2022 CLINICAL DATA:  Concern for pulmonary embolism. EXAM: CT ANGIOGRAPHY CHEST WITH CONTRAST TECHNIQUE: Multidetector CT imaging of the chest was performed using the standard protocol during bolus administration of intravenous contrast. Multiplanar CT image reconstructions and MIPs were obtained to evaluate the vascular anatomy. RADIATION DOSE REDUCTION: This exam was performed according to the departmental dose-optimization program which includes automated exposure control, adjustment of the mA and/or kV according to patient size and/or use of iterative reconstruction technique. CONTRAST:  185m OMNIPAQUE IOHEXOL 350 MG/ML SOLN COMPARISON:  Chest radiograph dated 11/13/2022. FINDINGS: Evaluation of this exam is limited due to respiratory motion artifact. Cardiovascular: There is no cardiomegaly or pericardial effusion. The thoracic aorta is unremarkable. Evaluation of the pulmonary arteries is limited due to respiratory motion. No obvious large or central pulmonary artery embolus identified. Mediastinum/Nodes: Right hilar adenopathy measures 13 mm short axis. The esophagus is grossly unremarkable. No mediastinal fluid collection. Lungs/Pleura: There is scattered hazy and ground-glass density throughout the lungs with mosaic appearance, likely related to underlying small airway or small vessel disease. No focal consolidation. There is no pleural effusion or pneumothorax. The central airways are patent. Upper Abdomen: Fatty liver. Musculoskeletal: Mild degenerative changes. No acute osseous pathology. Review of the MIP images confirms the above findings. IMPRESSION: 1. No acute intrathoracic pathology. No obvious large or central pulmonary artery embolus identified. 2. Fatty liver. 3. Right hilar adenopathy, likely reactive. Electronically Signed   By: AAnner CreteM.D.   On: 11/13/2022 23:22   DG Chest Portable 1  View  Result Date: 11/13/2022 CLINICAL DATA:  Provided history: Shortness of breath. Chest pain. History of SVT. EXAM: PORTABLE CHEST 1 VIEW COMPARISON:  Prior chest radiographs 08/15/2022 and earlier. FINDINGS: Cardiac monitoring pads overlie the chest. Heart size within normal limits. No appreciable airspace consolidation or pulmonary edema. No evidence of pleural effusion or pneumothorax. No acute osseous abnormality identified. IMPRESSION: No evidence of active cardiopulmonary disease. Electronically Signed   By: KKellie SimmeringD.O.   On: 11/13/2022 18:19    Microbiology: Results for orders placed or performed during the hospital encounter of 11/13/22  Resp panel by RT-PCR (RSV, Flu A&B, Covid) Anterior Nasal Swab     Status: None   Collection Time: 11/13/22  6:28 PM   Specimen: Anterior Nasal Swab  Result Value Ref Range Status   SARS Coronavirus 2 by RT PCR NEGATIVE NEGATIVE Final    Comment: (NOTE) SARS-CoV-2 target nucleic acids are NOT DETECTED.  The SARS-CoV-2 RNA is generally detectable in upper respiratory specimens during the acute phase of infection. The lowest concentration of SARS-CoV-2 viral copies this assay can detect is 138 copies/mL. A negative result does not preclude SARS-Cov-2 infection and should not be used as the sole basis for treatment or other patient management decisions. A negative result may occur with  improper specimen collection/handling, submission of specimen other than nasopharyngeal swab, presence of viral mutation(s) within the areas targeted by this assay, and inadequate number of viral copies(<138 copies/mL). A negative result must be combined with clinical observations, patient history, and epidemiological information. The expected result is Negative.  Fact Sheet for Patients:  EntrepreneurPulse.com.au  Fact Sheet for Healthcare Providers:  IncredibleEmployment.be  This test is no t yet approved or cleared  by the [place] FDA and  has been authorized for detection and/or diagnosis of SARS-CoV-2 by FDA under an Emergency Use Authorization (EUA). This EUA will remain  in effect (meaning this test can be used) for the duration of the COVID-19 declaration under Section 564(b)(1) of the Act, 21 U.S.C.section 360bbb-3(b)(1), unless the authorization is terminated  or revoked sooner.       Influenza A by PCR NEGATIVE NEGATIVE Final   Influenza B by PCR NEGATIVE NEGATIVE Final    Comment: (NOTE) The Xpert Xpress SARS-CoV-2/FLU/RSV plus assay is intended as an aid in the diagnosis of influenza from Nasopharyngeal swab specimens and should not be used as a sole basis for treatment. Nasal washings and aspirates are unacceptable for Xpert Xpress SARS-CoV-2/FLU/RSV testing.  Fact Sheet for Patients: EntrepreneurPulse.com.au  Fact Sheet for Healthcare Providers: IncredibleEmployment.be  This test is not yet approved or cleared by the [place] FDA and has been authorized for detection and/or diagnosis of SARS-CoV-2 by FDA under an Emergency Use Authorization (EUA). This EUA will remain in effect (meaning this test can be used) for the duration of the COVID-19 declaration under Section 564(b)(1) of the Act, 21 U.S.C. section 360bbb-3(b)(1), unless the authorization is terminated or revoked.     Resp Syncytial Virus by PCR NEGATIVE NEGATIVE Final    Comment: (NOTE) Fact Sheet for Patients: EntrepreneurPulse.com.au  Fact Sheet for Healthcare Providers: IncredibleEmployment.be  This test is not yet approved or cleared by the [place] FDA and has been authorized for detection and/or diagnosis of SARS-CoV-2 by FDA under an Emergency Use Authorization (EUA). This EUA will remain in effect (meaning this test can be used) for the duration of the COVID-19 declaration under Section 564(b)(1) of the Act, 21  U.S.C. section 360bbb-3(b)(1), unless the authorization is terminated or revoked.  Performed at [facility], [facility] [address] [place], [place] [address]     Labs: CBC: Recent Labs  Lab 11/16/22 [phone] 11/17/22 0514 11/18/22 0443 11/19/22 0543 11/20/22 0526  WBC 10.9* 10.0 13.6* 12.7* 12.2*  HGB 8.6* 9.8* 9.6* 9.4* 9.7*  HCT 32.0* 36.1 35.9* 35.3* 36.0  MCV 75.1* 75.1* 74.6* 76.1* 75.6*  PLT 309 330 348 346 [id number]   Basic Metabolic Panel: Recent Labs  Lab 11/16/22 0456 11/17/22 0514 11/18/22 0443  NA 136 137 137  K 3.8 3.9 4.2  CL 96* 97* 97*  CO2 '30 29 29  '$ GLUCOSE 181* 239* 245*  BUN '12 13 12  '$ CREATININE 0.81 0.86 0.87  CALCIUM 8.3* 8.5* 8.8*  MG 2.2 2.6* 2.0   Liver Function Tests: Recent Labs  Lab 11/16/22 0456 11/17/22 0514 11/18/22 0443  AST 24 39 46*  ALT '25 27 27  '$ ALKPHOS  70 94 99  BILITOT 1.4* 1.0 1.0  PROT 6.6 7.2 7.2  ALBUMIN 3.2* 3.3* 3.1*   CBG: Recent Labs  Lab 11/20/22 0005 11/20/22 0358 11/20/22 0725 11/20/22 1127 11/20/22 1616  GLUCAP 190* 171* 187* 218* 192*    Discharge time spent: 39 minutes.   Signed: Hosie Poisson, MD [facility]

## 2022-11-30 NOTE — Progress Notes (Signed)
Office Visit    Patient Name: Debbie Lee Date of Encounter: 12/02/2022  Primary Care Provider:  Azzie Glatter, FNP Primary Cardiologist:  Debbie Rouge, MD Primary Electrophysiologist: None  Chief Complaint    Debbie Lee is a 52 y.o. female with PMH of atrial flutter with RVR s/p TEE/DCCV now on Eliquis, SVT, combined systolic and diastolic CHF, DM type II, HTN, anemia, morbid obesity, OSA (on CPAP) who presents today for posthospital follow-up of new onset atrial flutter.  Past Medical History    Past Medical History:  Diagnosis Date   Anemia    Arthritis    Depression    Hypertension    Panic attack    Past Surgical History:  Procedure Laterality Date   CARDIOVERSION N/A 11/18/2022   Procedure: CARDIOVERSION;  Surgeon: Debbie Latch, MD;  Location: [facility] ENDOSCOPY;  Service: Cardiovascular;  Laterality: N/A;   ESOPHAGOGASTRODUODENOSCOPY N/A 11/15/2022   Procedure: ESOPHAGOGASTRODUODENOSCOPY (EGD);  Surgeon: Debbie Brace, MD;  Location: [facility] ENDOSCOPY;  Service: Gastroenterology;  Laterality: N/A;   NO PAST SURGERIES     ORIF ANKLE FRACTURE Right 10/29/2019   Procedure: OPEN REDUCTION INTERNAL FIXATION (ORIF) ANKLE FRACTURE;  Surgeon: Debbie Butters, MD;  Location: [place];  Service: Orthopedics;  Laterality: Right;   TEE WITHOUT CARDIOVERSION N/A 11/18/2022   Procedure: TRANSESOPHAGEAL ECHOCARDIOGRAM (TEE);  Surgeon: Debbie Latch, MD;  Location: [facility] ENDOSCOPY;  Service: Cardiovascular;  Laterality: N/A;    Allergies  Allergies  Allergen Reactions   Codeine Other (See Comments)    "Hallucinations. Narcotics cause vomiting."   Sulfa Antibiotics Other (See Comments)    "lifelong allergy"    History of Present Illness    Debbie Lee  is a 52 year old female with the above mention past medical history who presents today for new onset atrial flutter. Ms. Debbie Lee was seen previously in 2022 with SVT which converted with adenosine.  She was seen in 07/2022 with  complaint of shortness of breath and atrial flutter.  Patient was discharged with p.o. Cardizem with plan to follow-up with cardiology.  She missed her follow-up and ran out of oral Cardizem and over the past few days had experience worsening shortness of breath that she describes as panic attack. She presented to [facility] on 11/13/2022 with 2-1 atrial flutter and was placed on Cardizem drip with reduction in heart rate.  TSH was elevated and hemoglobin A1c was 9.3.  She converted from atrial flutter to April fibrillation and recommendation was made to start Eliquis and consider inpatient TEE with DCCV. Procedure was delayed due to EGD/colonoscopy and holding Eliquis prior to procedure for evaluation of anemia.  She underwent TEE/DCCV on 2/23 by Dr. Oval Lee with TEE showing reduced EF of 30-35% with no left atrial thrombus and trivial MR/PR.  She converted to sinus rhythm with 1 shock at 200 J.  She was placed on GDMT with Entresto, spironolactone, carvedilol.  Ms. Debbie Lee presents today for posthospital follow-up alone.  Since last being seen in the office patient reports that she has been doing well since her hospitalization with no recurrence of atrial flutter.  Her blood pressure today is well-controlled at 110/80 and heart rate was 86 bpm.  She is slightly volume up on exam but is otherwise euvolemic.  She reports that her breathing is much improved and she has less shortness of breath with activity. During today's visit we discussed pathophysiology of congestive heart failure and the importance of abstaining from excess salt in  the diet.  We also discussed primary prevention for cardiovascular disease and the importance of medication compliance.  She was advised to follow-up with her PCP regarding diabetes education and possible referral to diabetes coordinator.  Patient denies chest pain, palpitations, dyspnea, PND, orthopnea, nausea, vomiting, dizziness, syncope, edema, weight gain, or early  satiety.  Home Medications    Current Outpatient Medications  Medication Sig Dispense Refill   albuterol (VENTOLIN HFA) 108 (90 Base) MCG/ACT inhaler Inhale 2 puffs into the lungs every 6 (six) hours as needed for wheezing or shortness of breath. 18 g 2   apixaban (ELIQUIS) 5 MG TABS tablet Take 1 tablet (5 mg total) by mouth 2 (two) times daily. 60 tablet 2   Blood Glucose Monitoring Suppl DEVI 1 each by Does not apply route in the morning, at noon, and at bedtime. May substitute to any manufacturer covered by patient's insurance. 1 each 0   carvedilol (COREG) 12.5 MG tablet Take 1 tablet (12.5 mg total) by mouth 2 (two) times daily with a meal. 60 tablet 2   DULoxetine (CYMBALTA) 60 MG capsule Take 60 mg by mouth daily.     Glucose Blood (BLOOD GLUCOSE TEST STRIPS) STRP 1 each by In Vitro route in the morning, at noon, and at bedtime. May substitute to any manufacturer covered by patient's insurance. 100 strip 0   insulin aspart (NOVOLOG) 100 UNIT/ML FlexPen CBG 70 - 120: 0 units  CBG 121 - 150: 1 unit  CBG 151 - 200: 2 units  CBG 201 - 250: 3 units  CBG 251 - 300: 5 units  CBG 301 - 350: 7 units  CBG 351 - 400: 9 units 15 mL 11   insulin isophane & regular human KwikPen (NOVOLIN 70/30 KWIKPEN) (70-30) 100 UNIT/ML KwikPen Inject 15 Units into the skin 2 (two) times daily. 15 mL 11   Insulin Pen Needle (PEN NEEDLES 3/16") 31G X 5 MM MISC 100 each by Does not apply route 2 (two) times daily. 100 each 10   Lancet Device MISC 1 each by Does not apply route in the morning, at noon, and at bedtime. May substitute to any manufacturer covered by patient's insurance. 1 each 0   Lancets Misc. MISC 1 each by Does not apply route in the morning, at noon, and at bedtime. May substitute to any manufacturer covered by patient's insurance. 100 each 0   metFORMIN (GLUCOPHAGE) 500 MG tablet Take 1 tablet (500 mg total) by mouth 2 (two) times daily with a meal. 60 tablet 2   omeprazole (PRILOSEC) 20 MG  capsule Take 20 mg by mouth daily as needed (indigestion).     sacubitril-valsartan (ENTRESTO) 24-26 MG Take 1 tablet by mouth 2 (two) times daily. 60 tablet 2   spironolactone (ALDACTONE) 25 MG tablet Take 0.5 tablets (12.5 mg total) by mouth daily. 30 tablet 2   No current facility-administered medications for this visit.     Review of Systems  Please see the history of present illness.    (+) Shortness of breath with heavy exertion (+) Bruising  All other systems reviewed and are otherwise negative except as noted above.  Physical Exam    Wt Readings from Last 3 Encounters:  12/02/22 (!) 321 lb (145.6 kg)  11/20/22 (!) 316 lb 9.6 oz (143.6 kg)  05/23/22 (!) 310 lb (140.6 kg)   VS: Vitals:   12/02/22 0853  BP: 110/80  Pulse: 86  SpO2: 97%  ,Body mass index is 58.71 kg/m.  Constitutional:      Appearance: Healthy appearance. Not in distress.  Neck:     Vascular: JVD normal.  Pulmonary:     Effort: Pulmonary effort is normal.     Breath sounds: No wheezing. No rales. Diminished in the bases Cardiovascular:     Normal rate. Regular rhythm. Normal S1. Normal S2.      Murmurs: There is no murmur.  Edema:    Peripheral edema absent.  Abdominal:     Palpations: Abdomen is soft non tender. There is no hepatomegaly.  Skin:    General: Skin is warm and dry.  Neurological:     General: No focal deficit present.     Mental Status: Alert and oriented to person, place and time.     Cranial Nerves: Cranial nerves are intact.  EKG/LABS/ Recent Cardiac Studies    ECG personally reviewed by me today -none completed today  Risk Assessment/Calculations:    CHA2DS2-VASc Score = 3   This indicates a 3.2% annual risk of stroke. The patient's score is based upon: CHF History: 0 HTN History: 1 Diabetes History: 1 Stroke History: 0 Vascular Disease History: 0 Age Score: 0 Gender Score: 1           Lab Results  Component Value Date   WBC 12.2 (H) 11/20/2022   HGB 9.7  (L) 11/20/2022   HCT 36.0 11/20/2022   MCV 75.6 (L) 11/20/2022   PLT 341 11/20/2022   Lab Results  Component Value Date   CREATININE 0.87 11/18/2022   BUN 12 11/18/2022   NA 137 11/18/2022   K 4.2 11/18/2022   CL 97 (L) 11/18/2022   CO2 29 11/18/2022   Lab Results  Component Value Date   ALT 27 11/18/2022   AST 46 (H) 11/18/2022   ALKPHOS 99 11/18/2022   BILITOT 1.0 11/18/2022   No results found for: "CHOL", "HDL", "LDLCALC", "LDLDIRECT", "TRIG", "CHOLHDL"  Lab Results  Component Value Date   HGBA1C 9.3 (H) 11/14/2022    Cardiac Studies & Procedures       ECHOCARDIOGRAM  ECHOCARDIOGRAM COMPLETE 11/14/2022  Narrative ECHOCARDIOGRAM REPORT    Patient Name:   MICHALLE RADEMAKER Date of Exam: 11/14/2022 Medical Rec #:  [id number]   Height:       62.0 in Accession #:    [id number]  Weight:       310.0 lb Date of Birth:  May 28, 1971   BSA:          2.303 m Patient Age:    87 years    BP:           122/72 mmHg Patient Gender: F           HR:           74 bpm. Exam Location:  Inpatient  Procedure: 2D Echo, Color Doppler and Cardiac Doppler  Indications:    I48.92* Unspecified atrial flutter  History:        Patient has no prior history of Echocardiogram examinations. Risk Factors:Hypertension.  Sonographer:    Phineas Douglas Referring Phys: [id number]   1. Left ventricular ejection fraction, by estimation, is 30 to 35%. The left ventricle has moderately decreased function. The left ventricle demonstrates global hypokinesis. Left ventricular diastolic function could not be evaluated. 2. Right ventricular systolic function is moderately reduced. The right ventricular size is normal. There is normal pulmonary artery systolic pressure. 3. Left atrial size was mildly dilated. 4. Right atrial size was  mildly dilated. 5. The mitral valve is normal in structure. No evidence of mitral valve regurgitation. No evidence of mitral stenosis. 6. The aortic  valve is tricuspid. Aortic valve regurgitation is not visualized. No aortic stenosis is present. 7. The inferior vena cava is dilated in size with >50% respiratory variability, suggesting right atrial pressure of 8 mmHg.  FINDINGS Left Ventricle: Left ventricular ejection fraction, by estimation, is 30 to 35%. The left ventricle has moderately decreased function. The left ventricle demonstrates global hypokinesis. The left ventricular internal cavity size was normal in size. There is no left ventricular hypertrophy. Left ventricular diastolic function could not be evaluated due to atrial fibrillation. Left ventricular diastolic function could not be evaluated.  Right Ventricle: The right ventricular size is normal. No increase in right ventricular wall thickness. Right ventricular systolic function is moderately reduced. There is normal pulmonary artery systolic pressure. The tricuspid regurgitant velocity is 2.08 m/s, and with an assumed right atrial pressure of 15 mmHg, the estimated right ventricular systolic pressure is 48.1 mmHg.  Left Atrium: Left atrial size was mildly dilated.  Right Atrium: Right atrial size was mildly dilated.  Pericardium: There is no evidence of pericardial effusion.  Mitral Valve: The mitral valve is normal in structure. No evidence of mitral valve regurgitation. No evidence of mitral valve stenosis.  Tricuspid Valve: The tricuspid valve is normal in structure. Tricuspid valve regurgitation is mild . No evidence of tricuspid stenosis.  Aortic Valve: The aortic valve is tricuspid. Aortic valve regurgitation is not visualized. No aortic stenosis is present. Aortic valve mean gradient measures 6.0 mmHg. Aortic valve peak gradient measures 9.5 mmHg. Aortic valve area, by VTI measures 2.38 cm.  Pulmonic Valve: The pulmonic valve was normal in structure. Pulmonic valve regurgitation is not visualized. No evidence of pulmonic stenosis.  Aorta: The aortic root is  normal in size and structure.  Venous: The inferior vena cava is dilated in size with greater than 50% respiratory variability, suggesting right atrial pressure of 8 mmHg.  IAS/Shunts: No atrial level shunt detected by color flow Doppler.   LEFT VENTRICLE PLAX 2D LVIDd:         4.00 cm      Diastology LVIDs:         3.00 cm      LV e' medial:    10.80 cm/s LV PW:         1.10 cm      LV E/e' medial:  12.0 LV IVS:        1.20 cm      LV e' lateral:   15.20 cm/s LVOT diam:     2.10 cm      LV E/e' lateral: 8.6 LV SV:         70 LV SV Index:   30 LVOT Area:     3.46 cm  LV Volumes (MOD) LV vol d, MOD A2C: 112.0 ml LV vol d, MOD A4C: 96.0 ml LV vol s, MOD A2C: 53.6 ml LV vol s, MOD A4C: 46.9 ml LV SV MOD A2C:     58.4 ml LV SV MOD A4C:     96.0 ml LV SV MOD BP:      54.3 ml  RIGHT VENTRICLE             IVC RV Basal diam:  4.20 cm     IVC diam: 2.50 cm RV S prime:     10.30 cm/s TAPSE (M-mode): 1.7 cm  LEFT ATRIUM  Index        RIGHT ATRIUM           Index LA diam:        3.80 cm 1.65 cm/m   RA Area:     19.60 cm LA Vol (A2C):   65.1 ml 28.27 ml/m  RA Volume:   52.10 ml  22.62 ml/m LA Vol (A4C):   57.5 ml 24.97 ml/m LA Biplane Vol: 62.1 ml 26.96 ml/m AORTIC VALVE AV Area (Vmax):    2.21 cm AV Area (Vmean):   2.16 cm AV Area (VTI):     2.38 cm AV Vmax:           154.00 cm/s AV Vmean:          114.000 cm/s AV VTI:            0.292 m AV Peak Grad:      9.5 mmHg AV Mean Grad:      6.0 mmHg LVOT Vmax:         98.30 cm/s LVOT Vmean:        71.100 cm/s LVOT VTI:          0.201 m LVOT/AV VTI ratio: 0.69  AORTA Ao Root diam: 2.90 cm Ao Asc diam:  3.00 cm  MITRAL VALVE                TRICUSPID VALVE MV Area (PHT): 5.16 cm     TR Peak grad:   17.3 mmHg MV Decel Time: 147 msec     TR Vmax:        208.00 cm/s MV E velocity: 130.00 cm/s SHUNTS Systemic VTI:  0.20 m Systemic Diam: 2.10 cm  Glori Bickers MD Electronically signed by Glori Bickers  MD Signature Date/Time: 11/14/2022/4:01:22 PM    Final   TEE  ECHO TEE 11/18/2022  Narrative TRANSESOPHOGEAL ECHO REPORT    Patient Name:   BALEIGH RENNAKER Date of Exam: 11/18/2022 Medical Rec #:  [id number]   Height:       62.0 in Accession #:    [id number]  Weight:       327.6 lb Date of Birth:  November 06, 1970   BSA:          2.358 m Patient Age:    32 years    BP:           112/74 mmHg Patient Gender: F           HR:           146 bpm. Exam Location:  Inpatient  Procedure: Transesophageal Echo, Cardiac Doppler and Color Doppler  Indications:     ; I48.92* Unspecified atrial flutter  History:         Patient has prior history of Echocardiogram examinations, most recent 11/14/2022. Abnormal ECG, Arrythmias:Atrial Flutter; Risk Factors:Diabetes and Hypertension.  Sonographer:     Roseanna Rainbow RDCS Referring Phys:  [id number] HAO MENG Diagnosing Phys: Debbie Latch MD  PROCEDURE: After discussion of the risks and benefits of a TEE, an informed consent was obtained from the patient. The transesophogeal probe was passed without difficulty through the esophogus of the patient. Imaged were obtained with the patient in a left lateral decubitus position. Sedation performed by different physician. The patient was monitored while under deep sedation. Anesthestetic sedation was provided intravenously by Anesthesiology: 905mg  of Propofol. The patient's vital signs; including heart rate, blood pressure, and oxygen saturation; remained stable throughout the procedure. The patient developed no complications during the  procedure. A successful direct current cardioversion was performed at 200 joules with 1 attempt.  IMPRESSIONS   1. Left ventricular ejection fraction, by estimation, is 30 to 35%. The left ventricle has moderately decreased function. The left ventricle demonstrates global hypokinesis. 2. Right ventricular systolic function is normal. The right ventricular size is normal. 3. No left  atrial/left atrial appendage thrombus was detected. 4. The mitral valve is normal in structure. Trivial mitral valve regurgitation. No evidence of mitral stenosis. 5. The aortic valve is normal in structure. Aortic valve regurgitation is not visualized. No aortic stenosis is present. 6. The inferior vena cava is normal in size with greater than 50% respiratory variability, suggesting right atrial pressure of 3 mmHg. 7. Small PFO with left to right shunting at baseline. Bubble study was not obtained due to tenuous IV and patient respiratory status. There is a small patent foramen ovale with predominantly left to right shunting across the atrial septum.  FINDINGS Left Ventricle: Left ventricular ejection fraction, by estimation, is 30 to 35%. The left ventricle has moderately decreased function. The left ventricle demonstrates global hypokinesis. The left ventricular internal cavity size was normal in size. There is no left ventricular hypertrophy.  Right Ventricle: The right ventricular size is normal. No increase in right ventricular wall thickness. Right ventricular systolic function is normal.  Left Atrium: Left atrial size was normal in size. No left atrial/left atrial appendage thrombus was detected.  Right Atrium: Right atrial size was normal in size.  Pericardium: There is no evidence of pericardial effusion.  Mitral Valve: The mitral valve is normal in structure. Trivial mitral valve regurgitation. No evidence of mitral valve stenosis.  Tricuspid Valve: The tricuspid valve is normal in structure. Tricuspid valve regurgitation is trivial. No evidence of tricuspid stenosis.  Aortic Valve: The aortic valve is normal in structure. Aortic valve regurgitation is not visualized. No aortic stenosis is present.  Pulmonic Valve: The pulmonic valve was normal in structure. Pulmonic valve regurgitation is not visualized. No evidence of pulmonic stenosis.  Aorta: The aortic root is normal in size  and structure.  Venous: The inferior vena cava is normal in size with greater than 50% respiratory variability, suggesting right atrial pressure of 3 mmHg.  IAS/Shunts: No atrial level shunt detected by color flow Doppler. A small patent foramen ovale is detected with predominantly left to right shunting across the atrial septum. Small PFO with left to right shunting at baseline. Bubble study was not obtained due to tenuous IV and patient respiratory status.  Additional Comments: Spectral Doppler performed.  TRICUSPID VALVE TR Peak grad:   14.9 mmHg TR Vmax:        193.00 cm/s  Debbie Latch MD Electronically signed by Debbie Latch MD Signature Date/Time: 11/18/2022/2:25:17 PM    Final            Assessment & Plan    1.  Combined systolic and diastolic CHF: -Patient had TEE/DCCV performed due to atrial flutter that revealed EF of 30-35% with moderately decreased LV function and global hypokinesis -Today patient is slightly volume up due to sodium indiscretions and adjustments in her diet. -We will optimize her medication regimen with the addition of Jardiance 10 mg daily -Continue GDMT with carvedilol 12.5 mg twice daily, Entresto 24/26 mg daily, Aldactone 12.5 mg daily and Jardiance as noted above. -We will also add Lasix 20 mg daily -We will check BNP and BMET today -Low sodium diet, fluid restriction <2L, and daily weights encouraged. Educated to contact  our office for weight gain of 2 lbs overnight or 5 lbs in one week.   2.  Atrial flutter/atrial fibrillation: -New onset with TEE/DCCV performed and converted to sinus rhythm. -Today patient is sinus rhythm today with controlled rate of 86 -Continue rate control with carvedilol 12.5 mg twice daily -Patient was advised to avoid triggers such as caffeine, alcohol, and emotional upset. -Continue Eliquis 5 mg twice daily  3.  Essential hypertension: -Patient's blood pressures today are well-controlled today at  110/80 -Continue carvedilol 12.5 mg twice daily  4.  DM type II: -Newly diagnosed during hospitalization with hemoglobin A1c of 9.3% -Blood sugars are up and down due to adjusting current diet. -She was advised to follow-up with PCP regarding possible referral to diabetes coordinator or endocrinologist. -Jardiance 10 mg daily added to current GDMT  5.  Morbid obesity: -Patient's BMI is 58.71 -Patient was advised to reduce calorie intake and increase physical activity as tolerated.  Disposition: Follow-up with Debbie Rouge, MD or APP in 2 months   Medication Adjustments/Labs and Tests Ordered: Current medicines are reviewed at length with the patient today.  Concerns regarding medicines are outlined above.   Signed, Mable Fill, Marissa Nestle, NP 12/02/2022, 9:27 AM [facility]  Note:  This document was prepared using Dragon voice recognition software and may include unintentional dictation errors.

## 2022-12-02 ENCOUNTER — Encounter: Payer: Self-pay | Admitting: Nurse Practitioner

## 2022-12-02 ENCOUNTER — Ambulatory Visit: Payer: BLUE CROSS/BLUE SHIELD | Attending: Nurse Practitioner | Admitting: Nurse Practitioner

## 2022-12-02 VITALS — BP 110/80 | HR 86 | Ht 62.0 in | Wt 321.0 lb

## 2022-12-02 DIAGNOSIS — I10 Essential (primary) hypertension: Secondary | ICD-10-CM

## 2022-12-02 DIAGNOSIS — I4892 Unspecified atrial flutter: Secondary | ICD-10-CM | POA: Diagnosis not present

## 2022-12-02 DIAGNOSIS — E119 Type 2 diabetes mellitus without complications: Secondary | ICD-10-CM | POA: Diagnosis not present

## 2022-12-02 DIAGNOSIS — I5043 Acute on chronic combined systolic (congestive) and diastolic (congestive) heart failure: Secondary | ICD-10-CM

## 2022-12-02 MED ORDER — EMPAGLIFLOZIN 10 MG PO TABS: 10.0000 mg | ORAL_TABLET | Freq: Every day | ORAL | 3 refills | Status: DC

## 2022-12-02 MED ORDER — FUROSEMIDE 20 MG PO TABS: 20.0000 mg | ORAL_TABLET | ORAL | 1 refills | Status: DC | PRN

## 2022-12-02 NOTE — Patient Instructions (Signed)
Medication Instructions:  START Jardiance '10mg'$  Take 1 tablet once a day  *If you need a refill on your cardiac medications before your next appointment, please call your pharmacy*   Lab Work: TODAY-BMET, BNP. & CBC If you have labs (blood work) drawn today and your tests are completely normal, you will receive your results only by: [place] (if you have MyChart) OR A paper copy in the mail If you have any lab test that is abnormal or we need to change your treatment, we will call you to review the results.   Testing/Procedures: NONE ORDERED   Follow-Up: At [facility], you and your health needs are our priority.  As part of our continuing mission to provide you with exceptional heart care, we have created designated Provider Care Teams.  These Care Teams include your primary Cardiologist (physician) and Advanced Practice Providers (APPs -  Physician Assistants and Nurse Practitioners) who all work together to provide you with the care you need, when you need it.  We recommend signing up for the patient portal called "MyChart".  Sign up information is provided on this After Visit Summary.  MyChart is used to connect with patients for Virtual Visits (Telemedicine).  Patients are able to view lab/test results, encounter notes, upcoming appointments, etc.  Non-urgent messages can be sent to your provider as well.   To learn more about what you can do with MyChart, go to [email or web address].    Your next appointment:   2 month(s)  Provider:   Ambrose Pancoast, NP       Other Instructions  Please check your weight daily. Please contact the office if you gain more than 3lbs in a day or 5lbs in a week. Limit your salt intake to 1500-'2000mg'$  per day or '500mg'$  of Sodium per meal.

## 2022-12-03 LAB — BASIC METABOLIC PANEL
BUN/Creatinine Ratio: 20 (ref 9–23)
BUN: 19 mg/dL (ref 6–24)
CO2: 18 mmol/L — ABNORMAL LOW (ref 20–29)
Calcium: 9.7 mg/dL (ref 8.7–10.2)
Chloride: 103 mmol/L (ref 96–106)
Creatinine, Ser: 0.95 mg/dL (ref 0.57–1.00)
Glucose: 148 mg/dL — ABNORMAL HIGH (ref 70–99)
Potassium: 5.6 mmol/L — ABNORMAL HIGH (ref 3.5–5.2)
Sodium: 142 mmol/L (ref 134–144)
eGFR: 73 mL/min/{1.73_m2} (ref >59)

## 2022-12-03 LAB — CBC
Hematocrit: 43.4 % (ref 34.0–46.6)
Hemoglobin: 12.5 g/dL (ref 11.1–15.9)
MCH: 21.9 pg — ABNORMAL LOW (ref 26.6–33.0)
MCHC: 28.8 g/dL — ABNORMAL LOW (ref 31.5–35.7)
MCV: 76 fL — ABNORMAL LOW (ref 79–97)
Platelets: 325 10*3/uL (ref 150–450)
RBC: 5.72 x10E6/uL — ABNORMAL HIGH (ref 3.77–5.28)
RDW: 23.9 % — ABNORMAL HIGH (ref 11.7–15.4)
WBC: 11.1 10*3/uL — ABNORMAL HIGH (ref 3.4–10.8)

## 2022-12-03 LAB — PRO B NATRIURETIC PEPTIDE: NT-Pro BNP: 1485 pg/mL — ABNORMAL HIGH (ref 0–249)

## 2022-12-06 ENCOUNTER — Encounter (HOSPITAL_COMMUNITY): Payer: BLUE CROSS/BLUE SHIELD

## 2022-12-14 ENCOUNTER — Telehealth: Payer: Self-pay | Admitting: Nurse Practitioner

## 2022-12-14 NOTE — Telephone Encounter (Signed)
Pt returning call

## 2022-12-14 NOTE — Telephone Encounter (Signed)
Left message to call office

## 2022-12-14 NOTE — Telephone Encounter (Signed)
-----   Message from Marylu Lund., NP sent at 12/03/2022  8:01 AM EST ----- Please let Debbie Lee know that her blood counts are normalizing and show no evidence of bleeding on Eliquis.  The BNP results show that your heart failure is still not well-controlled.  Please instruct patient to increase Lasix to 40 mg twice daily x 3 days and then back to 20 mg daily.  Please have her come in in 1 week for repeat BMET and BNP.  Please advise her to continue abstaining from excess salt and moderating her liquids to 64 ounces per day.  Please let me know if you have any additional questions.  Ambrose Pancoast, NP

## 2022-12-14 NOTE — Telephone Encounter (Signed)
error 

## 2022-12-14 NOTE — Telephone Encounter (Signed)
Pt returning call for lab results, she asked for a c/b. She said email would work as well   Informed her letter was sent off yesterday and she may view via Mychart.

## 2022-12-15 ENCOUNTER — Other Ambulatory Visit: Payer: Self-pay | Admitting: Nurse Practitioner

## 2022-12-15 MED ORDER — "PEN NEEDLES 3/16"" 31G X 5 MM MISC": 15.0000 [IU] | Freq: Two times a day (BID) | 2 refills | Status: DC

## 2022-12-15 NOTE — Progress Notes (Signed)
Pt ran out of pen needles for insulin pens- insurance will not pay for RF until May. New script sent for 1000 quantity with 2 RFs

## 2022-12-19 ENCOUNTER — Telehealth (HOSPITAL_COMMUNITY): Payer: Self-pay

## 2022-12-19 NOTE — Telephone Encounter (Signed)
Called to confirm Heart & Vascular Transitions of Care appointment at 12/20/22. Patient reminded to bring all medications and pill box organizer with them. Gave directions, instructed to utilize [facility] parking.  Left message to confirm appointment.

## 2022-12-20 ENCOUNTER — Inpatient Hospital Stay (HOSPITAL_BASED_OUTPATIENT_CLINIC_OR_DEPARTMENT_OTHER)
Admit: 2022-12-20 | Discharge: 2022-12-20 | Disposition: A | Discharge status: Home / Self Care | Payer: BLUE CROSS/BLUE SHIELD | Attending: Internal Medicine | Admitting: Internal Medicine

## 2022-12-20 ENCOUNTER — Emergency Department (HOSPITAL_COMMUNITY): Payer: BLUE CROSS/BLUE SHIELD

## 2022-12-20 ENCOUNTER — Ambulatory Visit (HOSPITAL_BASED_OUTPATIENT_CLINIC_OR_DEPARTMENT_OTHER)
Admission: RE | Admit: 2022-12-20 | Discharge: 2022-12-20 | Disposition: A | Discharge status: Home / Self Care | Payer: BLUE CROSS/BLUE SHIELD | Source: Ambulatory Visit | Attending: Adult Health | Admitting: Adult Health

## 2022-12-20 ENCOUNTER — Encounter (HOSPITAL_COMMUNITY): Payer: Self-pay

## 2022-12-20 ENCOUNTER — Other Ambulatory Visit (HOSPITAL_COMMUNITY): Payer: Self-pay | Admitting: Internal Medicine

## 2022-12-20 ENCOUNTER — Other Ambulatory Visit: Payer: Self-pay

## 2022-12-20 ENCOUNTER — Emergency Department (HOSPITAL_COMMUNITY)
Admission: EM | Admit: 2022-12-20 | Discharge: 2022-12-20 | Disposition: A | Discharge status: Home / Self Care | Payer: BLUE CROSS/BLUE SHIELD | Attending: Emergency Medicine | Admitting: Emergency Medicine

## 2022-12-20 VITALS — BP 120/80 | HR 155 | Wt 315.6 lb

## 2022-12-20 DIAGNOSIS — I4891 Unspecified atrial fibrillation: Secondary | ICD-10-CM | POA: Insufficient documentation

## 2022-12-20 DIAGNOSIS — I5043 Acute on chronic combined systolic (congestive) and diastolic (congestive) heart failure: Secondary | ICD-10-CM

## 2022-12-20 DIAGNOSIS — Z7984 Long term (current) use of oral hypoglycemic drugs: Secondary | ICD-10-CM | POA: Insufficient documentation

## 2022-12-20 DIAGNOSIS — Z6841 Body Mass Index (BMI) 40.0 and over, adult: Secondary | ICD-10-CM | POA: Insufficient documentation

## 2022-12-20 DIAGNOSIS — E119 Type 2 diabetes mellitus without complications: Secondary | ICD-10-CM | POA: Insufficient documentation

## 2022-12-20 DIAGNOSIS — Z79899 Other long term (current) drug therapy: Secondary | ICD-10-CM | POA: Insufficient documentation

## 2022-12-20 DIAGNOSIS — R0602 Shortness of breath: Secondary | ICD-10-CM | POA: Insufficient documentation

## 2022-12-20 DIAGNOSIS — I4892 Unspecified atrial flutter: Secondary | ICD-10-CM | POA: Insufficient documentation

## 2022-12-20 DIAGNOSIS — I110 Hypertensive heart disease with heart failure: Secondary | ICD-10-CM | POA: Insufficient documentation

## 2022-12-20 DIAGNOSIS — Z7901 Long term (current) use of anticoagulants: Secondary | ICD-10-CM | POA: Insufficient documentation

## 2022-12-20 DIAGNOSIS — F32A Depression, unspecified: Secondary | ICD-10-CM | POA: Diagnosis not present

## 2022-12-20 DIAGNOSIS — I5022 Chronic systolic (congestive) heart failure: Secondary | ICD-10-CM | POA: Insufficient documentation

## 2022-12-20 DIAGNOSIS — Z794 Long term (current) use of insulin: Secondary | ICD-10-CM | POA: Diagnosis not present

## 2022-12-20 DIAGNOSIS — I509 Heart failure, unspecified: Secondary | ICD-10-CM | POA: Insufficient documentation

## 2022-12-20 DIAGNOSIS — E669 Obesity, unspecified: Secondary | ICD-10-CM | POA: Insufficient documentation

## 2022-12-20 DIAGNOSIS — Z87891 Personal history of nicotine dependence: Secondary | ICD-10-CM | POA: Insufficient documentation

## 2022-12-20 DIAGNOSIS — Q2112 Patent foramen ovale: Secondary | ICD-10-CM | POA: Diagnosis not present

## 2022-12-20 DIAGNOSIS — G4733 Obstructive sleep apnea (adult) (pediatric): Secondary | ICD-10-CM | POA: Insufficient documentation

## 2022-12-20 DIAGNOSIS — I471 Supraventricular tachycardia, unspecified: Secondary | ICD-10-CM | POA: Diagnosis not present

## 2022-12-20 LAB — BASIC METABOLIC PANEL
Anion gap: 12 (ref 5–15)
BUN: 14 mg/dL (ref 6–20)
CO2: 25 mmol/L (ref 22–32)
Calcium: 9.2 mg/dL (ref 8.9–10.3)
Chloride: 102 mmol/L (ref 98–111)
Creatinine, Ser: 0.88 mg/dL (ref 0.44–1.00)
GFR, Estimated: >60 mL/min (ref >60)
Glucose, Bld: 108 mg/dL — ABNORMAL HIGH (ref 70–99)
Potassium: 4.1 mmol/L (ref 3.5–5.1)
Sodium: 139 mmol/L (ref 135–145)

## 2022-12-20 LAB — CBC WITH DIFFERENTIAL/PLATELET
Abs Immature Granulocytes: 0 10*3/uL (ref 0.00–0.07)
Basophils Absolute: 0 10*3/uL (ref 0.0–0.1)
Basophils Relative: 0 %
Eosinophils Absolute: 0.3 10*3/uL (ref 0.0–0.5)
Eosinophils Relative: 3 %
HCT: 42.3 % (ref 36.0–46.0)
Hemoglobin: 12.4 g/dL (ref 12.0–15.0)
Lymphocytes Relative: 22 %
Lymphs Abs: 2.4 10*3/uL (ref 0.7–4.0)
MCH: 23.4 pg — ABNORMAL LOW (ref 26.0–34.0)
MCHC: 29.3 g/dL — ABNORMAL LOW (ref 30.0–36.0)
MCV: 80 fL (ref 80.0–100.0)
Monocytes Absolute: 0.4 10*3/uL (ref 0.1–1.0)
Monocytes Relative: 4 %
Neutro Abs: 7.7 10*3/uL (ref 1.7–7.7)
Neutrophils Relative %: 71 %
Platelets: 307 10*3/uL (ref 150–400)
RBC: 5.29 MIL/uL — ABNORMAL HIGH (ref 3.87–5.11)
RDW: 24.7 % — ABNORMAL HIGH (ref 11.5–15.5)
WBC: 10.9 10*3/uL — ABNORMAL HIGH (ref 4.0–10.5)
nRBC: 0 % (ref 0.0–0.2)
nRBC: 0 /100 WBC

## 2022-12-20 LAB — TROPONIN I (HIGH SENSITIVITY): Troponin I (High Sensitivity): 5 ng/L (ref <18)

## 2022-12-20 LAB — BRAIN NATRIURETIC PEPTIDE: B Natriuretic Peptide: 78.5 pg/mL (ref 0.0–100.0)

## 2022-12-20 LAB — MAGNESIUM: Magnesium: 2 mg/dL (ref 1.7–2.4)

## 2022-12-20 MED ORDER — AMIODARONE HCL IN DEXTROSE 360-4.14 MG/200ML-% IV SOLN: 30.0000 mg/h | INTRAVENOUS | Status: DC

## 2022-12-20 MED ORDER — AMIODARONE LOAD VIA INFUSION
150.0000 mg | Freq: Once | INTRAVENOUS | Status: DC
  Filled 2022-12-20: qty 83.34

## 2022-12-20 MED ORDER — FUROSEMIDE 20 MG PO TABS: 40.0000 mg | ORAL_TABLET | Freq: Every day | ORAL | Status: DC

## 2022-12-20 MED ORDER — AMIODARONE HCL IN DEXTROSE 360-4.14 MG/200ML-% IV SOLN: 60.0000 mg/h | INTRAVENOUS | Status: DC

## 2022-12-20 MED ORDER — AMIODARONE HCL 400 MG PO TABS: 400.0000 mg | ORAL_TABLET | Freq: Two times a day (BID) | ORAL | 0 refills | Status: DC

## 2022-12-20 MED ORDER — FUROSEMIDE 10 MG/ML IJ SOLN: 20.0000 mg | Freq: Every day | INTRAMUSCULAR | Status: DC

## 2022-12-20 MED ORDER — APIXABAN 5 MG PO TABS
5.0000 mg | ORAL_TABLET | Freq: Two times a day (BID) | ORAL | Status: DC
  Administered 2022-12-20: 5 mg via ORAL
  Filled 2022-12-20: qty 1

## 2022-12-20 MED ORDER — FUROSEMIDE 10 MG/ML IJ SOLN
80.0000 mg | Freq: Once | INTRAMUSCULAR | Status: AC
  Administered 2022-12-20: 80 mg via INTRAVENOUS
  Filled 2022-12-20: qty 8

## 2022-12-20 MED ORDER — AMIODARONE HCL 200 MG PO TABS
400.0000 mg | ORAL_TABLET | Freq: Two times a day (BID) | ORAL | Status: DC
  Administered 2022-12-20: 400 mg via ORAL
  Filled 2022-12-20: qty 2

## 2022-12-20 NOTE — Progress Notes (Signed)
CSW unable to see patient in clinic as she was transported to the ED for further medical work up and possible admission. CSW will follow up with inpatient [facility] team as needed. Raquel Sarna, [place], [place]

## 2022-12-20 NOTE — ED Triage Notes (Signed)
Pt was sent here from the heart and vascular clinic with a concern of atrial fibrillation.

## 2022-12-20 NOTE — ED Provider Notes (Signed)
I provided a substantive portion of the care of this patient.  I personally made/approved the management plan for this patient and take responsibility for the patient management.   Patient sent here from atrial fibrillation clinic with A-fib with RVR.  She arrives here normal sinus rhythm per my review and interpretation of EKG.  She has been having some shortness of breath.  She is on Eliquis.  She has a history of diabetes and heart failure.  She was sent to be started on amiodarone and IV Lasix.  However vital signs appear to be stable now.  Cardiology team to evaluate the patient.  Will give a dose IV Lasix but hold on any antiarrhythmics at this time.  Basic labs including troponin and BNP and electrolytes have been ordered.  Chest x-ray per my review and interpretation do not show any obvious signs of volume overload.  Will continue to help manage patient.  Awaiting cardiology recommendations.  This chart was dictated using voice recognition software.  Despite best efforts to proofread,  errors can occur which can change the documentation meaning.   EKG Interpretation  Date/Time:  Tuesday December 20 2022 10:00:08 EDT Ventricular Rate:  91 PR Interval:  134 QRS Duration: 66 QT Interval:  354 QTC Calculation: 435 R Axis:   62 Text Interpretation: Normal sinus rhythm Normal ECG When compared with ECG of 20-Dec-2022 09:21, PREVIOUS ECG IS PRESENT Confirmed by Lennice Sites ([id number]) on 12/20/2022 10:01:21 AM    Lennice Sites, DO 12/20/22 1024

## 2022-12-20 NOTE — Discharge Instructions (Signed)
Start amiodarone.  Follow-up with cardiology.

## 2022-12-20 NOTE — Progress Notes (Signed)
Saw patient in the ED with Dr. Daniel Nones. She was sent from AHF clinic as EKG showed a fib RVR in 160s. Since arrival to ED patient had self converted back to NSR. EKG: NSR. Tele with NSR 70s-90s.   Volume mildly elevated, BNP only 80. Will do 40 mg lasix for next 3 days (starting tomorrow) and then she can resume lasix 20 mg daily. SCr stable .88.   Start 400 amiodarone BID for 7 days with 1 week f/u in AHF clinic for plans to wean. 7 day Zio placed prior to d/c.   Forestine Na, AGACNP-BC  12/20/22

## 2022-12-20 NOTE — Progress Notes (Signed)
HEART & VASCULAR TRANSITION OF CARE CONSULT NOTE     Referring Physician: Dr Karleen Hampshire  Primary Care: Kathe Becton FNP  Primary Cardiologist: Dr Johnsie Cancel   HPI: Referred to clinic by Dr Karleen Hampshire for heart failure consultation.   Debbie Lee is a 51 year old with a history of OSA, SVT, A flutter, IDA, DMII, obesity, and chronic HFrEF. She has OSA but can't afford CPAP.   Admitted 11/13/22 with A flutter RVR. Placed on coreg and cardizem. Echo showed reduced EF 30-35%. 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 Diltiazem stopped and continued on coreg. Diuresed with IV lasix. TEE/DC-CV with shock x1. Converted to SR. Started on GDMT and eliquis. Discharged 11/20/22.    Had follow up with cardiology.  Started on jardiance and lasix 20 mg daily. Stable at that time.   Complaining of fatigue. SOB with exertion. Uses oxygen at night.  Denies PND/Orthopnea. Appetite ok. No fever or chills. She does not weigh at home. She doesn't have a scale. Did not take morning medications.   Cardiac Testing  DC-CV 11/18/22 shock x1 with 200 J-->NSR   TEE 10/2022   1. Left ventricular ejection fraction, by estimation, is 30 to 35%. The  left ventricle has moderately decreased function. The left ventricle  demonstrates global hypokinesis.   2. Right ventricular systolic function is normal. The right ventricular  size is normal.   3. No left atrial/left atrial appendage thrombus was detected.   4. The mitral valve is normal in structure. Trivial mitral valve  regurgitation. No evidence of mitral stenosis.   5. The aortic valve is normal in structure. Aortic valve regurgitation is  not visualized. No aortic stenosis is present.   6. The inferior vena cava is normal in size with greater than 50%  respiratory variability, suggesting right atrial pressure of 3 mmHg.   7. Small PFO with left to right shunting at baseline. Bubble study was  not obtained due to tenuous IV and patient respiratory status. There is a   small patent foramen ovale with predominantly left to right shunting  across the atrial septum.   Echo 10/2022  1. Left ventricular ejection fraction, by estimation, is 30 to 35%. The  left ventricle has moderately decreased function. The left ventricle  demonstrates global hypokinesis. Left ventricular diastolic function could  not be evaluated.   2. Right ventricular systolic function is moderately reduced. The right  ventricular size is normal. There is normal pulmonary artery systolic  pressure.   3. Left atrial size was mildly dilated.   4. Right atrial size was mildly dilated.   5. The mitral valve is normal in structure. No evidence of mitral valve  regurgitation. No evidence of mitral stenosis.   6. The aortic valve is tricuspid. Aortic valve regurgitation is not  visualized. No aortic stenosis is present.  Review of Systems: [y] = yes, [ ]  = no   General: Weight gain [ ] ; Weight loss [ ] ; Anorexia [ ] ; Fatigue [ Y]; Fever [ ] ; Chills [ ] ; Weakness [ ]   Cardiac: Chest pain/pressure [ ] ; Resting SOB [ ] ; Exertional SOB [ Y]; Orthopnea [ ] ; Pedal Edema [ Y]; Palpitations [ ] ; Syncope [ ] ; Presyncope [ ] ; Paroxysmal nocturnal dyspnea[ ]   Pulmonary: Cough [ ] ; Wheezing[ ] ; Hemoptysis[ ] ; Sputum [ ] ; Snoring [ ]   GI: Vomiting[ ] ; Dysphagia[ ] ; Melena[ ] ; Hematochezia [ ] ; Heartburn[ ] ; Abdominal pain [ ] ; Constipation [ ] ; Diarrhea [ ] ; BRBPR [ ]   GU: Hematuria[ ] ;  Dysuria [ ] ; Nocturia[ ]   Vascular: Pain in legs with walking [ ] ; Pain in feet with lying flat [ ] ; Non-healing sores [ ] ; Stroke [ ] ; TIA [ ] ; Slurred speech [ ] ;  Neuro: Headaches[ ] ; Vertigo[ ] ; Seizures[ ] ; Paresthesias[ ] ;Blurred vision [ ] ; Diplopia [ ] ; Vision changes [ ]   Ortho/Skin: Arthritis [ ] ; Joint pain [ Y]; Muscle pain [ ] ; Joint swelling [ ] ; Back Pain [Y ]; Rash [ ]   Psych: Depression[ Y]; Anxiety[ ]   Heme: Bleeding problems [ ] ; Clotting disorders [ ] ; Anemia [Y ]  Endocrine: Diabetes [ Y]; Thyroid  dysfunction[ ]    Past Medical History:  Diagnosis Date   Anemia    Arthritis    Depression    Hypertension    Panic attack     Current Outpatient Medications  Medication Sig Dispense Refill   albuterol (VENTOLIN HFA) 108 (90 Base) MCG/ACT inhaler Inhale 2 puffs into the lungs every 6 (six) hours as needed for wheezing or shortness of breath. 18 g 2   apixaban (ELIQUIS) 5 MG TABS tablet Take 1 tablet (5 mg total) by mouth 2 (two) times daily. 60 tablet 2   Blood Glucose Monitoring Suppl DEVI 1 each by Does not apply route in the morning, at noon, and at bedtime. May substitute to any manufacturer covered by patient's insurance. 1 each 0   carvedilol (COREG) 12.5 MG tablet Take 1 tablet (12.5 mg total) by mouth 2 (two) times daily with a meal. 60 tablet 2   DULoxetine (CYMBALTA) 60 MG capsule Take 60 mg by mouth daily.     empagliflozin (JARDIANCE) 10 MG TABS tablet Take 1 tablet (10 mg total) by mouth daily before breakfast. 30 tablet 3   furosemide (LASIX) 20 MG tablet Take 1 tablet (20 mg total) by mouth as needed for edema or fluid (WEIGHT GAIN OF 2 LBS IN A DAY OR 5 LBS IN A WEEK). 30 tablet 1   Glucose Blood (BLOOD GLUCOSE TEST STRIPS) STRP 1 each by In Vitro route in the morning, at noon, and at bedtime. May substitute to any manufacturer covered by patient's insurance. 100 strip 0   insulin aspart (NOVOLOG) 100 UNIT/ML FlexPen CBG 70 - 120: 0 units  CBG 121 - 150: 1 unit  CBG 151 - 200: 2 units  CBG 201 - 250: 3 units  CBG 251 - 300: 5 units  CBG 301 - 350: 7 units  CBG 351 - 400: 9 units 15 mL 11   insulin isophane & regular human KwikPen (NOVOLIN 70/30 KWIKPEN) (70-30) 100 UNIT/ML KwikPen Inject 15 Units into the skin 2 (two) times daily. 15 mL 11   Insulin Pen Needle (PEN NEEDLES 3/16") 31G X 5 MM MISC Inject 15 Units into the skin 2 (two) times daily. 1000 each 2   Lancet Device MISC 1 each by Does not apply route in the morning, at noon, and at bedtime. May substitute to any  manufacturer covered by patient's insurance. 1 each 0   Lancets Misc. MISC 1 each by Does not apply route in the morning, at noon, and at bedtime. May substitute to any manufacturer covered by patient's insurance. 100 each 0   metFORMIN (GLUCOPHAGE) 500 MG tablet Take 1 tablet (500 mg total) by mouth 2 (two) times daily with a meal. 60 tablet 2   omeprazole (PRILOSEC) 20 MG capsule Take 20 mg by mouth daily as needed (indigestion).     sacubitril-valsartan (ENTRESTO) 24-26 MG Take 1  tablet by mouth 2 (two) times daily. 60 tablet 2   spironolactone (ALDACTONE) 25 MG tablet Take 0.5 tablets (12.5 mg total) by mouth daily. 30 tablet 2   No current facility-administered medications for this encounter.    Allergies  Allergen Reactions   Codeine Other (See Comments)    "Hallucinations. Narcotics cause vomiting."   Sulfa Antibiotics Other (See Comments)    "lifelong allergy"      Social History   Socioeconomic History   Marital status: Divorced    Spouse name: Not on file   Number of children: 1   Years of education: Not on file   Highest education level: Associate degree: occupational, Hotel manager, or vocational program  Occupational History   Not on file  Tobacco Use   Smoking status: Former    Packs/day: 1.00    Years: 16.00    Additional pack years: 0.00    Total pack years: 16.00    Types: Cigarettes    Start date: 09/26/1986    Quit date: 09/26/2002    Years since quitting: 20.2   Smokeless tobacco: Never   Tobacco comments:    Age [age]  Vaping Use   Vaping Use: Never used  Substance and Sexual Activity   Alcohol use: Not Currently    Comment: 4/year   Drug use: No   Sexual activity: Not Currently    Birth control/protection: Implant  Other Topics Concern   Not on file  Social History Narrative   Not on file   Social Determinants of Health   Financial Resource Strain: High Risk (11/16/2022)   Overall Financial Resource Strain (CARDIA)    Difficulty of Paying Living  Expenses: Hard  Food Insecurity: No Food Insecurity (11/17/2022)   Hunger Vital Sign    Worried About Running Out of Food in the Last Year: Never true    Ran Out of Food in the Last Year: Never true  Recent Concern: Food Insecurity - Food Insecurity Present (11/16/2022)   Hunger Vital Sign    Worried About Running Out of Food in the Last Year: Sometimes true    Ran Out of Food in the Last Year: Sometimes true  Transportation Needs: No Transportation Needs (11/16/2022)   PRAPARE - Hydrologist (Medical): No    Lack of Transportation (Non-Medical): No  Recent Concern: Transportation Needs - Unmet Transportation Needs (11/14/2022)   PRAPARE - Hydrologist (Medical): Yes    Lack of Transportation (Non-Medical): No  Physical Activity: Not on file  Stress: Not on file  Social Connections: Not on file  Intimate Partner Violence: Not At Risk (11/14/2022)   Humiliation, Afraid, Rape, and Kick questionnaire    Fear of Current or Ex-Partner: No    Emotionally Abused: No    Physically Abused: No    Sexually Abused: No      Family History  Adopted: Yes    Vitals:   12/20/22 0915  BP: 120/80  Pulse: (!) 155  SpO2: 97%  Weight: (!) 143.2 kg (315 lb 9.6 oz)    PHYSICAL EXAM: General: Arrived in a wheel chair. No respiratory difficulty HEENT: normal Neck: supple. Difficult to assess. Carotids 2+ bilat; no bruits. No lymphadenopathy or thryomegaly appreciated. Cor: PMI nondisplaced. Tachy Irregular rate & rhythm. No rubs, gallops or murmurs. Lungs: clear Abdomen: obese, soft, nontender, nondistended. No hepatosplenomegaly. No bruits or masses. Good bowel sounds. Extremities: no cyanosis, clubbing, rash, R and LLE trace-1+ edema Neuro: alert &  oriented x 3, cranial nerves grossly intact. moves all 4 extremities w/o difficulty. Affect pleasant.  ECG: A fib RVR 169 bpm personally checked.    ASSESSMENT & PLAN: 1. Acute/Chronic HFrEF   10/2022 Echo newly reduced EF 30-35% . Suspect tachy-mediated. She has not had an ischemic work up. May need cath if EF does not recover.  NYHA III. Volume overloaded. Start IV lasix 80 mg twice a day.  Diuretic- Will need IV diuresis.  BB- Contineu coreg 12.5 mg twice a day .  Ace/ARB/ARNI- Continue entresto 24-26 mg twice a day  MRA- Continue spironolactone 12.5 mg daily  SGLT2i- Continue jardiance 10 mg daily   2. A Fib RVR -Had TEE/Cardioversion 10/2022 --> NSR -EKG today -->  A fib RVR 169 bpm  - Will need to load on amiodarone. Watch pressures may need to cut back coreg.  - Continue eliquis 5 mg twice a day.  - Plan cardioversion once diuresed.  - Check TSH   3. H/O SVT  4. PFO  Small PFA noted on TEE  5. DMII  10/2022 Hgb A1C 9.3  Will need SSI   6. OSA Untreated sleep apnea. Unable to afford CPAP.   Load amio, continue eliquis, and diurese with IV lasix. Check labs.  Discussed with Dr Haroldine Laws.  Sent to [facility] for admit.  Amy Clegg NP-C  9:55 AM   Patient seen and examined with the above-signed Advanced Practice Provider and/or Housestaff. I personally reviewed laboratory data, imaging studies and relevant notes. I independently examined the patient and formulated the important aspects of the plan. I have edited the note to reflect any of my changes or salient points. I have personally discussed the plan with the patient and/or family.  52 y/o woman as above with recent admission for AF and newly reduced EF. Underwent TEE/DC-CV.   Presents today for post-hospital f/u in [facility]. Has had increasing SOB.   In Clinic found to be in atypical AFL/AT with HR 169. Denies palpitations. + SOB and fatigue  General: Sitting in chair No resp difficulty HEENT: normal Neck: supple. JVP hard to see  Carotids 2+ bilat; no bruits. No lymphadenopathy or thryomegaly appreciated. Cor: PMI nondisplaced. Irregular tachy. No rubs, gallops or murmurs. Lungs: clear Abdomen: obese  soft, nontender, nondistended. No hepatosplenomegaly. No bruits or masses. Good bowel sounds. Extremities: no cyanosis, clubbing, rash, 1+ edema Neuro: alert & orientedx3, cranial nerves grossly intact. moves all 4 extremities w/o difficulty. Affect pleasant  She has recurrent rapid AFL/AT with tachy-CM and mild volume overload. Will send to ER. Suspect she may need admission for amio load and repeat d/c as well as diuresis.   Glori Bickers, MD  10:53 AM

## 2022-12-20 NOTE — ED Provider Notes (Signed)
[facility] Provider Note   CSN: [id number] Arrival date & time: 12/20/22  [id number]     History  Chief Complaint  Patient presents with   Atrial Fibrillation    Debbie Lee is a 52 y.o. female with past medical history of atrial flutter, CHF, DMII, OSA, HTN, and anemia who was sent to the ED from the heart clinic concerned for atrial flutter with RVR. Patient admits to taking one Lasix dose (of unknown amount) yesterday around 12pm. Patient then began experiencing dyspnea on exertion later in the afternoon. Also endorses BL leg tenderness maybe related to LE edema since her discharge from the hospital on 11/20/2022. Patient compliant with apixaban but missed her dose this morning. Denies chest pain, palpitations, vision changes, dizziness, weakness, confusion.  Working with cardiac team to coordinate care.  The history is provided by the patient.  Atrial Fibrillation Pertinent negatives include no chest pain.       Home Medications Prior to Admission medications   Medication Sig Start Date End Date Taking? Authorizing Provider  albuterol (VENTOLIN HFA) 108 (90 Base) MCG/ACT inhaler Inhale 2 puffs into the lungs every 6 (six) hours as needed for wheezing or shortness of breath. 11/20/22   Hosie Poisson, MD  amiodarone (PACERONE) 400 MG tablet Take 1 tablet (400 mg total) by mouth 2 (two) times daily. 12/20/22   Earnie Larsson, NP  apixaban (ELIQUIS) 5 MG TABS tablet Take 1 tablet (5 mg total) by mouth 2 (two) times daily. 11/20/22   Hosie Poisson, MD  Blood Glucose Monitoring Suppl DEVI 1 each by Does not apply route in the morning, at noon, and at bedtime. May substitute to any manufacturer covered by patient's insurance. 11/20/22   Hosie Poisson, MD  carvedilol (COREG) 12.5 MG tablet Take 1 tablet (12.5 mg total) by mouth 2 (two) times daily with a meal. 11/20/22   Hosie Poisson, MD  DULoxetine (CYMBALTA) 60 MG capsule Take 60 mg by mouth daily.  10/22/20   [provider]  empagliflozin (JARDIANCE) 10 MG TABS tablet Take 1 tablet (10 mg total) by mouth daily before breakfast. 12/02/22   Marylu Lund., NP  furosemide (LASIX) 20 MG tablet Take 1 tablet (20 mg total) by mouth as needed for edema or fluid (WEIGHT GAIN OF 2 LBS IN A DAY OR 5 LBS IN A WEEK). 12/02/22   Marylu Lund., NP  Glucose Blood (BLOOD GLUCOSE TEST STRIPS) STRP 1 each by In Vitro route in the morning, at noon, and at bedtime. May substitute to any manufacturer covered by patient's insurance. 11/20/22 12/20/22  Hosie Poisson, MD  insulin aspart (NOVOLOG) 100 UNIT/ML FlexPen CBG 70 - 120: 0 units  CBG 121 - 150: 1 unit  CBG 151 - 200: 2 units  CBG 201 - 250: 3 units  CBG 251 - 300: 5 units  CBG 301 - 350: 7 units  CBG 351 - 400: 9 units 11/20/22   Hosie Poisson, MD  insulin isophane & regular human KwikPen (NOVOLIN 70/30 KWIKPEN) (70-30) 100 UNIT/ML KwikPen Inject 15 Units into the skin 2 (two) times daily. 11/20/22   Hosie Poisson, MD  Insulin Pen Needle (PEN NEEDLES 3/16") 31G X 5 MM MISC Inject 15 Units into the skin 2 (two) times daily. 12/15/22   Samella Parr, NP  Lancet Device MISC 1 each by Does not apply route in the morning, at noon, and at bedtime. May substitute to any manufacturer  covered by patient's insurance. 11/20/22 12/20/22  Hosie Poisson, MD  Lancets Misc. MISC 1 each by Does not apply route in the morning, at noon, and at bedtime. May substitute to any manufacturer covered by patient's insurance. 11/20/22 12/20/22  Hosie Poisson, MD  metFORMIN (GLUCOPHAGE) 500 MG tablet Take 1 tablet (500 mg total) by mouth 2 (two) times daily with a meal. 11/20/22   Hosie Poisson, MD  omeprazole (PRILOSEC) 20 MG capsule Take 20 mg by mouth daily as needed (indigestion).    [provider]  sacubitril-valsartan (ENTRESTO) 24-26 MG Take 1 tablet by mouth 2 (two) times daily. 11/20/22   Hosie Poisson, MD  spironolactone (ALDACTONE) 25 MG tablet Take 0.5  tablets (12.5 mg total) by mouth daily. 11/21/22   Hosie Poisson, MD      Allergies    Codeine and Sulfa antibiotics    Review of Systems   Review of Systems  Constitutional:  Negative for chills and fever.  Respiratory:         Dyspnea on exertion  Cardiovascular:  Positive for leg swelling. Negative for chest pain and palpitations.  Gastrointestinal:  Negative for nausea and vomiting.  Genitourinary:  Negative for difficulty urinating and dysuria.  Neurological:  Negative for dizziness, syncope and weakness.  Psychiatric/Behavioral:  Negative for confusion.     Physical Exam Updated Vital Signs BP 120/76   Pulse 96   Temp 97.9 F (36.6 C) (Oral)   Resp 20   Ht 5\' 2"  (1.575 m)   Wt (!) 143.2 kg   SpO2 100%   BMI 57.74 kg/m  Physical Exam Vitals reviewed.  Constitutional:      Appearance: Normal appearance. She is obese. She is not toxic-appearing or diaphoretic.  HENT:     Head: Normocephalic and atraumatic.  Eyes:     Extraocular Movements: Extraocular movements intact.  Cardiovascular:     Rate and Rhythm: Normal rate and regular rhythm.  Pulmonary:     Effort: Pulmonary effort is normal.     Breath sounds: Normal breath sounds.  Abdominal:     Tenderness: There is no abdominal tenderness.  Musculoskeletal:     Comments: +1 pitting edema on bilateral LE. Tenderness to palpation of BL anterior legs when checking for edema.  Skin:    General: Skin is warm and dry.  Neurological:     Mental Status: She is alert and oriented to person, place, and time.  Psychiatric:        Mood and Affect: Mood normal.        Behavior: Behavior normal.     ED Results / Procedures / Treatments   Labs (all labs ordered are listed, but only abnormal results are displayed) Labs Reviewed  CBC WITH DIFFERENTIAL/PLATELET - Abnormal; Notable for the following components:      Result Value   WBC 10.9 (*)    RBC 5.29 (*)    MCH 23.4 (*)    MCHC 29.3 (*)    RDW 24.7 (*)    All  other components within normal limits  BASIC METABOLIC PANEL - Abnormal; Notable for the following components:   Glucose, Bld 108 (*)    All other components within normal limits  BRAIN NATRIURETIC PEPTIDE  MAGNESIUM  TROPONIN I (HIGH SENSITIVITY)  TROPONIN I (HIGH SENSITIVITY)    EKG EKG Interpretation  Date/Time:  Tuesday December 20 2022 10:00:08 EDT Ventricular Rate:  91 PR Interval:  134 QRS Duration: 66 QT Interval:  354 QTC Calculation: 435 R  Axis:   62 Text Interpretation: Normal sinus rhythm Normal ECG When compared with ECG of 20-Dec-2022 09:21, PREVIOUS ECG IS PRESENT Confirmed by Lennice Sites [phone]) on 12/20/2022 10:01:21 AM  Radiology DG Chest Portable 1 View  Result Date: 12/20/2022 CLINICAL DATA:  Shortness of breath.  Atrial fibrillation. EXAM: PORTABLE CHEST 1 VIEW COMPARISON:  11/16/2022 FINDINGS: The heart size and mediastinal contours are within normal limits. Both lungs are clear. The visualized skeletal structures are unremarkable. IMPRESSION: No active disease. Electronically Signed   By: Kerby Moors M.D.   On: 12/20/2022 10:20    Procedures Procedures    Medications Ordered in ED Medications  apixaban (ELIQUIS) tablet 5 mg (5 mg Oral Given 12/20/22 1019)  amiodarone (PACERONE) tablet 400 mg (400 mg Oral Given 12/20/22 1310)  furosemide (LASIX) tablet 40 mg (has no administration in time range)  furosemide (LASIX) injection 20 mg (has no administration in time range)  furosemide (LASIX) injection 80 mg (80 mg Intravenous Given 12/20/22 1020)    ED Course/ Medical Decision Making/ A&P                             Medical Decision Making Appreciate patient coordination with cardiac team.  Patient was sent to ED from her heart clinic appointment due to atrial flutter with RVR. EKG in emergency department showed NSR. Patient complained about bilateral lower leg stiffness/tenderness since her discharge from the hospital on 11/20/2022 that she states could  be due to fluid retention. Physical exam showed +1 pedal edema on bilateral anterior legs. She also complains of dyspnea on exertion that started yesterday afternoon. She took one dose of lasix yesterday around 12pm. Given 80mg  Lasix in emergency department to help with BL lower extremity edema.  BMP within normal limits makes electrolyte abnormality less likely. BNP 78.5 and troponin negative making arrhythmia due to heart failure exacerbation / acute myocardial ischemia less likely.  Cardiac team consulted and recommended to start 400 amiodarone BID for 7 days with 1 week f/u in AHF clinic for plans to wean. 7 day Zio placed prior to d/c. Patient stable in ED.  Amount and/or Complexity of Data Reviewed Labs: ordered. Radiology: ordered.  Risk Prescription drug management. Decision regarding hospitalization.           Final Clinical Impression(s) / ED Diagnoses Final diagnoses:  Acute on chronic heart failure, unspecified heart failure type [facility])  Atrial fibrillation with rapid ventricular response ([place])    Rx / DC Orders ED Discharge Orders          Ordered    amiodarone (PACERONE) 400 MG tablet  2 times daily        12/20/22 [place], [place] F, [place] 12/20/22 [place], [place], DO 12/21/22 0703

## 2022-12-26 NOTE — Progress Notes (Signed)
Advanced Heart Failure Clinic Note   Referring Physician: PCP: Azzie Glatter, FNP PCP-Cardiologist: Jenkins Rouge, MD  [facility]: Dr. Haroldine Laws   HPI:  Debbie Lee is a 52 year old with a history of OSA, SVT, A flutter, IDA, DMII, obesity, and chronic HFrEF. She has OSA but can't afford CPAP.    Admitted 11/13/22 with A flutter RVR. Placed on coreg and cardizem. Echo showed reduced EF 30-35%. 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 Diltiazem stopped and continued on coreg. Diuresed with IV lasix. TEE/DC-CV with shock x1. Converted to SR. Started on GDMT and eliquis. Discharged 11/20/22. D/c wt was 319 lb.    Had follow up with cardiology.  Started on jardiance and lasix 20 mg daily. Stable at that time.    Had 1st TOC clinic appt last week and complained of increasing SOB and found to be in atypical AFL/AT with HR 169. Denied palpitations. + SOB and fatigue. Was sent to the ED for IV Lasix and IV amiodarone but ultimately spontaneously converted back to NSR before getting started on amio gtt. She was given a dose of IV Lasix w/ good urinary response and symptomatic improvement. Was placed on PO amiodarone 400 mg bid. 7 day Zio was placed to assess burden. Home diuretic regimen was titrated, instructed to take 40 mg lasix x 3 days, then back to 20 mg daily.   She presents to clinic today for post ED f/u. Reports feeling well. EKG shows NSR, 65 bpm. Denies palpitations. Breathing improved. Able to do most ADLs w/o significant dyspnea, though ambulates slowly w/ use of a walker. Wt 315 lb, (4 lb below previous hospital d/c wt). Denies CP. Fully compliant w/ medications. Tolerating well w/o side effects. No positional dizziness. BP 108/80 after AM meds. Completed Zio yesterday, planing to mail in today.    Review of Systems: [y] = yes, [ ]  = no   General: Weight gain [ ] ; Weight loss [ ] ; Anorexia [ ] ; Fatigue [ ] ; Fever [ ] ; Chills [ ] ; Weakness [ ]   Cardiac: Chest pain/pressure [ ] ; Resting SOB [  ]; Exertional SOB [ ] ; Orthopnea [ ] ; Pedal Edema [ ] ; Palpitations [ ] ; Syncope [ ] ; Presyncope [ ] ; Paroxysmal nocturnal dyspnea[ ]   Pulmonary: Cough [ ] ; Wheezing[ ] ; Hemoptysis[ ] ; Sputum [ ] ; Snoring [ ]   GI: Vomiting[ ] ; Dysphagia[ ] ; Melena[ ] ; Hematochezia [ ] ; Heartburn[ ] ; Abdominal pain [ ] ; Constipation [ ] ; Diarrhea [ ] ; BRBPR [ ]   GU: Hematuria[ ] ; Dysuria [ ] ; Nocturia[ ]   Vascular: Pain in legs with walking [ ] ; Pain in feet with lying flat [ ] ; Non-healing sores [ ] ; Stroke [ ] ; TIA [ ] ; Slurred speech [ ] ;  Neuro: Headaches[ ] ; Vertigo[ ] ; Seizures[ ] ; Paresthesias[ ] ;Blurred vision [ ] ; Diplopia [ ] ; Vision changes [ ]   Ortho/Skin: Arthritis [Y ]; Joint pain [ Y]; Muscle pain [ ] ; Joint swelling [ ] ; Back Pain [ ] ; Rash [ ]   Psych: Depression[ ] ; Anxiety[ ]   Heme: Bleeding problems [ ] ; Clotting disorders [ ] ; Anemia [ ]   Endocrine: Diabetes [ Y]; Thyroid dysfunction[ ]    Past Medical History:  Diagnosis Date   Anemia    Arthritis    Depression    Hypertension    Panic attack     Current Outpatient Medications  Medication Sig Dispense Refill   albuterol (VENTOLIN HFA) 108 (90 Base) MCG/ACT inhaler Inhale 2 puffs into the lungs every 6 (six) hours as needed for wheezing or  shortness of breath. 18 g 2   amiodarone (PACERONE) 400 MG tablet Take 1 tablet (400 mg total) by mouth 2 (two) times daily. 14 tablet 0   apixaban (ELIQUIS) 5 MG TABS tablet Take 1 tablet (5 mg total) by mouth 2 (two) times daily. 60 tablet 2   Blood Glucose Monitoring Suppl DEVI 1 each by Does not apply route in the morning, at noon, and at bedtime. May substitute to any manufacturer covered by patient's insurance. 1 each 0   carvedilol (COREG) 12.5 MG tablet Take 1 tablet (12.5 mg total) by mouth 2 (two) times daily with a meal. 60 tablet 2   DULoxetine (CYMBALTA) 60 MG capsule Take 60 mg by mouth daily.     empagliflozin (JARDIANCE) 10 MG TABS tablet Take 1 tablet (10 mg total) by mouth daily  before breakfast. 30 tablet 3   furosemide (LASIX) 20 MG tablet Take 1 tablet (20 mg total) by mouth as needed for edema or fluid (WEIGHT GAIN OF 2 LBS IN A DAY OR 5 LBS IN A WEEK). 30 tablet 1   insulin aspart (NOVOLOG) 100 UNIT/ML FlexPen CBG 70 - 120: 0 units  CBG 121 - 150: 1 unit  CBG 151 - 200: 2 units  CBG 201 - 250: 3 units  CBG 251 - 300: 5 units  CBG 301 - 350: 7 units  CBG 351 - 400: 9 units 15 mL 11   insulin isophane & regular human KwikPen (NOVOLIN 70/30 KWIKPEN) (70-30) 100 UNIT/ML KwikPen Inject 15 Units into the skin 2 (two) times daily. 15 mL 11   Insulin Pen Needle (PEN NEEDLES 3/16") 31G X 5 MM MISC Inject 15 Units into the skin 2 (two) times daily. 1000 each 2   metFORMIN (GLUCOPHAGE) 500 MG tablet Take 1 tablet (500 mg total) by mouth 2 (two) times daily with a meal. 60 tablet 2   omeprazole (PRILOSEC) 20 MG capsule Take 20 mg by mouth daily as needed (indigestion).     sacubitril-valsartan (ENTRESTO) 24-26 MG Take 1 tablet by mouth 2 (two) times daily. 60 tablet 2   spironolactone (ALDACTONE) 25 MG tablet Take 0.5 tablets (12.5 mg total) by mouth daily. 30 tablet 2   No current facility-administered medications for this encounter.    Allergies  Allergen Reactions   Codeine Other (See Comments)    "Hallucinations. Narcotics cause vomiting."   Sulfa Antibiotics Other (See Comments)    "lifelong allergy"      Social History   Socioeconomic History   Marital status: Divorced    Spouse name: Not on file   Number of children: 1   Years of education: Not on file   Highest education level: Associate degree: occupational, Hotel manager, or vocational program  Occupational History   Not on file  Tobacco Use   Smoking status: Former    Packs/day: 1.00    Years: 16.00    Additional pack years: 0.00    Total pack years: 16.00    Types: Cigarettes    Start date: 09/26/1986    Quit date: 09/26/2002    Years since quitting: 20.2   Smokeless tobacco: Never   Tobacco  comments:    Age [age]  Vaping Use   Vaping Use: Never used  Substance and Sexual Activity   Alcohol use: Not Currently    Comment: 4/year   Drug use: No   Sexual activity: Not Currently    Birth control/protection: Implant  Other Topics Concern   Not on file  Social History Narrative   Not on file   Social Determinants of Health   Financial Resource Strain: High Risk (11/16/2022)   Overall Financial Resource Strain (CARDIA)    Difficulty of Paying Living Expenses: Hard  Food Insecurity: No Food Insecurity (11/17/2022)   Hunger Vital Sign    Worried About Running Out of Food in the Last Year: Never true    Ran Out of Food in the Last Year: Never true  Recent Concern: Food Insecurity - Food Insecurity Present (11/16/2022)   Hunger Vital Sign    Worried About Running Out of Food in the Last Year: Sometimes true    Ran Out of Food in the Last Year: Sometimes true  Transportation Needs: No Transportation Needs (11/16/2022)   PRAPARE - Hydrologist (Medical): No    Lack of Transportation (Non-Medical): No  Recent Concern: Transportation Needs - Unmet Transportation Needs (11/14/2022)   PRAPARE - Hydrologist (Medical): Yes    Lack of Transportation (Non-Medical): No  Physical Activity: Not on file  Stress: Not on file  Social Connections: Not on file  Intimate Partner Violence: Not At Risk (11/14/2022)   Humiliation, Afraid, Rape, and Kick questionnaire    Fear of Current or Ex-Partner: No    Emotionally Abused: No    Physically Abused: No    Sexually Abused: No      Family History  Adopted: Yes    Vitals:   12/27/22 0847  Pulse: 68  SpO2: 95%  Weight: (!) 142.9 kg (315 lb)     PHYSICAL EXAM: General:  Well appearing, obese. No respiratory difficulty HEENT: normal Neck: supple. Thick neck, JVD not well visualized. Carotids 2+ bilat; no bruits. No lymphadenopathy or thyromegaly appreciated. Cor: PMI  nondisplaced. Regular rate & rhythm. No rubs, gallops or murmurs. Lungs: clear Abdomen: soft, nontender, nondistended. No hepatosplenomegaly. No bruits or masses. Good bowel sounds. Extremities: no cyanosis, clubbing, rash, edema Neuro: alert & oriented x 3, cranial nerves grossly intact. moves all 4 extremities w/o difficulty. Affect pleasant.  ECG: NSR 65 bpm    ASSESSMENT & PLAN:  1. Chronic Systolic Heart Failure  - Echo 2/24 w/ newly reduced EF 30-35%, RV moderately reduced.  - Suspect tachy-mediated. She has not had an ischemic work up, though no h/o CP. May need cath if EF does not recover w/ mantanence of NSR - NYHA II, confounded by obesity and deconditioning. Not grossly volume overloaded on exam, though difficult due to body habitus. Wt below previous d/c dry wt.  - check BMP and BNP today  - continue Lasix 20 mg daily   - continue Coreg 12.5 mg bid  - continue Entresto 24-26 mg twice a day. BP too soft for titration  - increase spironolactone to 25 mg daily  - continue jardiance 10 mg daily    2. PAF  - Had TEE/Cardioversion 10/2022 --> NSR. Back in Afib w/ RVR 3/24, spontaneously converted back to NSR - now on amiodarone, 400 mg bid. EKG today shows NSR. Denies breakthrough afib symptoms. Awaiting result of 7 day Zio to assess burden   - further titrate amiodarone down to 200 mg bid x 2 wks>>200 mg daily  (check TSH and HFTs next visit)   - long term therapy w/ amiodarone not ideal given young age. Will refer to Afib Clinic/EP for eval for other options. Weight/ BMI may prohibit ablation. ? Tikosyn  - Continue Eliquis BID. Denies abnormal bleeding. Check  CBC today   3. H/o SVT - denies recent symptoms - now on amiodarone  - Zio pending     4. PFO  - Small PFA noted on TEE   5. DMII  -Hgb A1C 9.3  -on insulin, Metformin + Jardiance -followed by PCP    6. OSA - Untreated sleep apnea. Unable to afford CPAP.  - this will make control of Afib more difficult - wt  loss would be beneficial   7. Obesity (Body mass index is 57.61 kg/m.) - has T2DM  - refer to pharmacy for GLP-1. We discussed and she is agreeable to this   F/u w/ APP in 4 wks     Sammantha Mehlhaff Rosita Fire, PA-C 12/27/22

## 2022-12-27 ENCOUNTER — Encounter (HOSPITAL_COMMUNITY): Payer: Self-pay

## 2022-12-27 ENCOUNTER — Ambulatory Visit (HOSPITAL_COMMUNITY)
Admit: 2022-12-27 | Discharge: 2022-12-27 | Disposition: A | Discharge status: Home / Self Care | Payer: BLUE CROSS/BLUE SHIELD | Attending: Cardiology | Admitting: Cardiology

## 2022-12-27 VITALS — BP 108/80 | HR 68 | Wt 315.0 lb

## 2022-12-27 DIAGNOSIS — Z79899 Other long term (current) drug therapy: Secondary | ICD-10-CM | POA: Insufficient documentation

## 2022-12-27 DIAGNOSIS — Z794 Long term (current) use of insulin: Secondary | ICD-10-CM | POA: Insufficient documentation

## 2022-12-27 DIAGNOSIS — Z7901 Long term (current) use of anticoagulants: Secondary | ICD-10-CM | POA: Insufficient documentation

## 2022-12-27 DIAGNOSIS — E119 Type 2 diabetes mellitus without complications: Secondary | ICD-10-CM | POA: Insufficient documentation

## 2022-12-27 DIAGNOSIS — Z7984 Long term (current) use of oral hypoglycemic drugs: Secondary | ICD-10-CM | POA: Diagnosis not present

## 2022-12-27 DIAGNOSIS — I110 Hypertensive heart disease with heart failure: Secondary | ICD-10-CM | POA: Diagnosis not present

## 2022-12-27 DIAGNOSIS — G4733 Obstructive sleep apnea (adult) (pediatric): Secondary | ICD-10-CM | POA: Diagnosis not present

## 2022-12-27 DIAGNOSIS — Q2112 Patent foramen ovale: Secondary | ICD-10-CM | POA: Insufficient documentation

## 2022-12-27 DIAGNOSIS — E669 Obesity, unspecified: Secondary | ICD-10-CM | POA: Insufficient documentation

## 2022-12-27 DIAGNOSIS — Z6841 Body Mass Index (BMI) 40.0 and over, adult: Secondary | ICD-10-CM | POA: Diagnosis not present

## 2022-12-27 DIAGNOSIS — I480 Paroxysmal atrial fibrillation: Secondary | ICD-10-CM | POA: Insufficient documentation

## 2022-12-27 DIAGNOSIS — I10 Essential (primary) hypertension: Secondary | ICD-10-CM | POA: Diagnosis not present

## 2022-12-27 DIAGNOSIS — I5022 Chronic systolic (congestive) heart failure: Secondary | ICD-10-CM | POA: Diagnosis present

## 2022-12-27 LAB — BASIC METABOLIC PANEL
Anion gap: 13 (ref 5–15)
BUN: 15 mg/dL (ref 6–20)
CO2: 26 mmol/L (ref 22–32)
Calcium: 9.4 mg/dL (ref 8.9–10.3)
Chloride: 100 mmol/L (ref 98–111)
Creatinine, Ser: 0.92 mg/dL (ref 0.44–1.00)
GFR, Estimated: >60 mL/min (ref >60)
Glucose, Bld: 124 mg/dL — ABNORMAL HIGH (ref 70–99)
Potassium: 4.7 mmol/L (ref 3.5–5.1)
Sodium: 139 mmol/L (ref 135–145)

## 2022-12-27 LAB — CBC
HCT: 41.3 % (ref 36.0–46.0)
Hemoglobin: 12.2 g/dL (ref 12.0–15.0)
MCH: 23.5 pg — ABNORMAL LOW (ref 26.0–34.0)
MCHC: 29.5 g/dL — ABNORMAL LOW (ref 30.0–36.0)
MCV: 79.6 fL — ABNORMAL LOW (ref 80.0–100.0)
Platelets: 299 10*3/uL (ref 150–400)
RBC: 5.19 MIL/uL — ABNORMAL HIGH (ref 3.87–5.11)
RDW: 23.8 % — ABNORMAL HIGH (ref 11.5–15.5)
WBC: 10.6 10*3/uL — ABNORMAL HIGH (ref 4.0–10.5)
nRBC: 0 % (ref 0.0–0.2)

## 2022-12-27 LAB — BRAIN NATRIURETIC PEPTIDE: B Natriuretic Peptide: 53 pg/mL (ref 0.0–100.0)

## 2022-12-27 MED ORDER — SPIRONOLACTONE 25 MG PO TABS: 25.0000 mg | ORAL_TABLET | Freq: Every day | ORAL | 3 refills | Status: DC

## 2022-12-27 MED ORDER — AMIODARONE HCL 400 MG PO TABS: ORAL_TABLET | ORAL | 1 refills | Status: DC

## 2022-12-27 NOTE — Patient Instructions (Signed)
Decrease Amiodarone to 200 mg twice daily for two weeks, then take 200 mg daily. Increase Spiro to 25 mg (one whole tablet) daily. Referral sent to [facility] - they should contact you; if not you may call number below. Will ask Pharmacy to contact you regarding starting Ozempic. Return to [facility] in 4 weeks. Please call us at [phone] if any questions or concerns before your next appointment.

## 2022-12-28 ENCOUNTER — Other Ambulatory Visit (HOSPITAL_COMMUNITY): Payer: Self-pay

## 2022-12-28 DIAGNOSIS — I10 Essential (primary) hypertension: Secondary | ICD-10-CM

## 2022-12-28 DIAGNOSIS — I480 Paroxysmal atrial fibrillation: Secondary | ICD-10-CM

## 2022-12-28 MED ORDER — AMIODARONE HCL 200 MG PO TABS: 200.0000 mg | ORAL_TABLET | Freq: Every day | ORAL | 0 refills | Status: DC

## 2023-01-06 ENCOUNTER — Other Ambulatory Visit: Payer: Self-pay | Admitting: Nurse Practitioner

## 2023-01-11 NOTE — Addendum Note (Signed)
Encounter addended by: Andee Lineman A on: 01/11/2023 9:23 AM  Actions taken: Imaging Exam ended

## 2023-01-24 ENCOUNTER — Encounter (HOSPITAL_COMMUNITY): Payer: BLUE CROSS/BLUE SHIELD

## 2023-01-26 ENCOUNTER — Encounter (HOSPITAL_COMMUNITY): Payer: Self-pay

## 2023-01-26 ENCOUNTER — Ambulatory Visit (HOSPITAL_COMMUNITY)
Admission: RE | Admit: 2023-01-26 | Discharge: 2023-01-26 | Disposition: A | Discharge status: Home / Self Care | Payer: BLUE CROSS/BLUE SHIELD | Source: Ambulatory Visit | Attending: Adult Health | Admitting: Adult Health

## 2023-01-26 VITALS — BP 124/82 | HR 69 | Wt 308.8 lb

## 2023-01-26 DIAGNOSIS — Z5941 Food insecurity: Secondary | ICD-10-CM | POA: Diagnosis not present

## 2023-01-26 DIAGNOSIS — I493 Ventricular premature depolarization: Secondary | ICD-10-CM | POA: Diagnosis not present

## 2023-01-26 DIAGNOSIS — Z87891 Personal history of nicotine dependence: Secondary | ICD-10-CM | POA: Diagnosis not present

## 2023-01-26 DIAGNOSIS — E119 Type 2 diabetes mellitus without complications: Secondary | ICD-10-CM | POA: Diagnosis not present

## 2023-01-26 DIAGNOSIS — I472 Ventricular tachycardia, unspecified: Secondary | ICD-10-CM | POA: Insufficient documentation

## 2023-01-26 DIAGNOSIS — Z6841 Body Mass Index (BMI) 40.0 and over, adult: Secondary | ICD-10-CM | POA: Insufficient documentation

## 2023-01-26 DIAGNOSIS — E611 Iron deficiency: Secondary | ICD-10-CM | POA: Diagnosis not present

## 2023-01-26 DIAGNOSIS — I5022 Chronic systolic (congestive) heart failure: Secondary | ICD-10-CM | POA: Diagnosis not present

## 2023-01-26 DIAGNOSIS — Z79899 Other long term (current) drug therapy: Secondary | ICD-10-CM | POA: Insufficient documentation

## 2023-01-26 DIAGNOSIS — Z5986 Financial insecurity: Secondary | ICD-10-CM | POA: Diagnosis not present

## 2023-01-26 DIAGNOSIS — Z794 Long term (current) use of insulin: Secondary | ICD-10-CM | POA: Diagnosis not present

## 2023-01-26 DIAGNOSIS — Z7984 Long term (current) use of oral hypoglycemic drugs: Secondary | ICD-10-CM | POA: Insufficient documentation

## 2023-01-26 DIAGNOSIS — G4733 Obstructive sleep apnea (adult) (pediatric): Secondary | ICD-10-CM | POA: Insufficient documentation

## 2023-01-26 DIAGNOSIS — Z7901 Long term (current) use of anticoagulants: Secondary | ICD-10-CM | POA: Diagnosis not present

## 2023-01-26 DIAGNOSIS — I480 Paroxysmal atrial fibrillation: Secondary | ICD-10-CM | POA: Diagnosis not present

## 2023-01-26 DIAGNOSIS — I471 Supraventricular tachycardia, unspecified: Secondary | ICD-10-CM

## 2023-01-26 DIAGNOSIS — E669 Obesity, unspecified: Secondary | ICD-10-CM | POA: Diagnosis not present

## 2023-01-26 DIAGNOSIS — Q2112 Patent foramen ovale: Secondary | ICD-10-CM | POA: Diagnosis not present

## 2023-01-26 DIAGNOSIS — R5383 Other fatigue: Secondary | ICD-10-CM | POA: Diagnosis not present

## 2023-01-26 DIAGNOSIS — I110 Hypertensive heart disease with heart failure: Secondary | ICD-10-CM | POA: Diagnosis present

## 2023-01-26 LAB — COMPREHENSIVE METABOLIC PANEL
ALT: 23 U/L (ref 0–44)
AST: 23 U/L (ref 15–41)
Albumin: 3.5 g/dL (ref 3.5–5.0)
Alkaline Phosphatase: 60 U/L (ref 38–126)
Anion gap: 10 (ref 5–15)
BUN: 12 mg/dL (ref 6–20)
CO2: 26 mmol/L (ref 22–32)
Calcium: 9 mg/dL (ref 8.9–10.3)
Chloride: 100 mmol/L (ref 98–111)
Creatinine, Ser: 0.89 mg/dL (ref 0.44–1.00)
GFR, Estimated: >60 mL/min (ref >60)
Glucose, Bld: 128 mg/dL — ABNORMAL HIGH (ref 70–99)
Potassium: 4.3 mmol/L (ref 3.5–5.1)
Sodium: 136 mmol/L (ref 135–145)
Total Bilirubin: 0.8 mg/dL (ref 0.3–1.2)
Total Protein: 7.8 g/dL (ref 6.5–8.1)

## 2023-01-26 LAB — CBC
HCT: 39.8 % (ref 36.0–46.0)
Hemoglobin: 12.1 g/dL (ref 12.0–15.0)
MCH: 24.7 pg — ABNORMAL LOW (ref 26.0–34.0)
MCHC: 30.4 g/dL (ref 30.0–36.0)
MCV: 81.4 fL (ref 80.0–100.0)
Platelets: 301 10*3/uL (ref 150–400)
RBC: 4.89 MIL/uL (ref 3.87–5.11)
RDW: 22 % — ABNORMAL HIGH (ref 11.5–15.5)
WBC: 9.9 10*3/uL (ref 4.0–10.5)
nRBC: 0 % (ref 0.0–0.2)

## 2023-01-26 LAB — TSH: TSH: 23.311 u[IU]/mL — ABNORMAL HIGH (ref 0.350–4.500)

## 2023-01-26 LAB — IRON AND TIBC
Iron: 40 ug/dL (ref 28–170)
Saturation Ratios: 10 % — ABNORMAL LOW (ref 10.4–31.8)
TIBC: 388 ug/dL (ref 250–450)
UIBC: 348 ug/dL

## 2023-01-26 LAB — FERRITIN: Ferritin: 40 ng/mL (ref 11–307)

## 2023-01-26 MED ORDER — AMIODARONE HCL 200 MG PO TABS: 200.0000 mg | ORAL_TABLET | Freq: Every day | ORAL | 3 refills | Status: DC

## 2023-01-26 NOTE — Progress Notes (Signed)
Advanced Heart Failure Clinic Note   Referring Physician: PCP: Kallie Locks, FNP PCP-Cardiologist: Charlton Haws, MD  [facility]: Dr. Gala Romney   HPI: Ms Pendley is a 52 year old with a history of OSA, SVT, A flutter, IDA, DMII, obesity, and chronic HFrEF. She has OSA but can't afford CPAP.    Admitted 11/13/22 with A flutter RVR. Placed on coreg and cardizem. Echo showed reduced EF 30-35%. 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 Diltiazem stopped and continued on coreg. Diuresed with IV lasix. TEE/DC-CV with shock x1. Converted to SR. Started on GDMT and eliquis. Discharged 11/20/22. D/c wt was 319 lb.    Had follow up with cardiology.  Started on jardiance and lasix 20 mg daily. Stable at that time.    During her 1st [facility] clinic appt she complained of increasing SOB and found to be in atypical AFL/AT with HR 169. Denied palpitations. + SOB and fatigue. Was sent to the ED for IV Lasix and IV amiodarone but ultimately spontaneously converted back to NSR before getting started on amio gtt. She was given a dose of IV Lasix w/ good urinary response and symptomatic improvement. Was placed on PO amiodarone 400 mg bid. 7 day Zio was placed to assess burden. Home diuretic regimen was titrated, instructed to take 40 mg lasix x 3 days, then back to 20 mg daily.   7 day Zio with NSR, avg HR 78. Short bursts NSVT, longest 9.9 seconds. PVCs <1%, PACs 1.5%.  Today she returns for AHF follow up. Overall feeling ok, slightly fatigued, called out of work Tuesday. Suspects this is transient and just needed a break from work. Denies palpitations and CP. Dizziness only if she gets up too fast, mild edema in BLE. No PND/Orthopnea. Intermittent SOB, suspects 2/2 allergies. Appetite fair, eats a lot at [employer] (as she works there) but does limit herself to low sodium/low sugar options. No fever or chills. Weight at home 308-310 pounds. Last day of work 01/27/23, off for the summer. Taking all medications.    Past  Medical History:  Diagnosis Date   Anemia    Arthritis    Depression    Hypertension    Panic attack     Current Outpatient Medications  Medication Sig Dispense Refill   albuterol (VENTOLIN HFA) 108 (90 Base) MCG/ACT inhaler Inhale 2 puffs into the lungs every 6 (six) hours as needed for wheezing or shortness of breath. 18 g 2   apixaban (ELIQUIS) 5 MG TABS tablet Take 1 tablet (5 mg total) by mouth 2 (two) times daily. 60 tablet 2   Blood Glucose Monitoring Suppl DEVI 1 each by Does not apply route in the morning, at noon, and at bedtime. May substitute to any manufacturer covered by patient's insurance. 1 each 0   carvedilol (COREG) 12.5 MG tablet Take 1 tablet (12.5 mg total) by mouth 2 (two) times daily with a meal. 60 tablet 2   DULoxetine (CYMBALTA) 60 MG capsule Take 60 mg by mouth daily.     empagliflozin (JARDIANCE) 10 MG TABS tablet Take 1 tablet (10 mg total) by mouth daily before breakfast. 30 tablet 3   furosemide (LASIX) 20 MG tablet TAKE 1 TABLET AS NEEDED FOR EDEMA OR FLUID (WEIGHT GAIN OF 2 LBS IN A DAY OR 5 LBS IN A WEEK). 90 tablet 3   insulin aspart (NOVOLOG) 100 UNIT/ML FlexPen CBG 70 - 120: 0 units  CBG 121 - 150: 1 unit  CBG 151 - 200: 2 units  CBG  201 - 250: 3 units  CBG 251 - 300: 5 units  CBG 301 - 350: 7 units  CBG 351 - 400: 9 units 15 mL 11   insulin isophane & regular human KwikPen (NOVOLIN 70/30 KWIKPEN) (70-30) 100 UNIT/ML KwikPen Inject 15 Units into the skin 2 (two) times daily. 15 mL 11   Insulin Pen Needle (PEN NEEDLES 3/16") 31G X 5 MM MISC Inject 15 Units into the skin 2 (two) times daily. 1000 each 2   metFORMIN (GLUCOPHAGE) 500 MG tablet Take 1 tablet (500 mg total) by mouth 2 (two) times daily with a meal. 60 tablet 2   omeprazole (PRILOSEC) 20 MG capsule Take 20 mg by mouth daily as needed (indigestion).     sacubitril-valsartan (ENTRESTO) 24-26 MG Take 1 tablet by mouth 2 (two) times daily. 60 tablet 2   spironolactone (ALDACTONE) 25 MG tablet  Take 1 tablet (25 mg total) by mouth daily. 90 tablet 3   amiodarone (PACERONE) 200 MG tablet Take 1 tablet (200 mg total) by mouth daily. 90 tablet 3   No current facility-administered medications for this encounter.   Allergies  Allergen Reactions   Codeine Other (See Comments)    "Hallucinations. Narcotics cause vomiting."   Sulfa Antibiotics Other (See Comments)    "lifelong allergy"   Social History   Socioeconomic History   Marital status: Divorced    Spouse name: Not on file   Number of children: 1   Years of education: Not on file   Highest education level: Associate degree: occupational, Scientist, product/process development, or vocational program  Occupational History   Not on file  Tobacco Use   Smoking status: Former    Packs/day: 1.00    Years: 16.00    Additional pack years: 0.00    Total pack years: 16.00    Types: Cigarettes    Start date: 09/26/1986    Quit date: 09/26/2002    Years since quitting: 20.3   Smokeless tobacco: Never   Tobacco comments:    Age [age]  Vaping Use   Vaping Use: Never used  Substance and Sexual Activity   Alcohol use: Not Currently    Comment: 4/year   Drug use: No   Sexual activity: Not Currently    Birth control/protection: Implant  Other Topics Concern   Not on file  Social History Narrative   Not on file   Social Determinants of Health   Financial Resource Strain: High Risk (11/16/2022)   Overall Financial Resource Strain (CARDIA)    Difficulty of Paying Living Expenses: Hard  Food Insecurity: No Food Insecurity (11/17/2022)   Hunger Vital Sign    Worried About Running Out of Food in the Last Year: Never true    Ran Out of Food in the Last Year: Never true  Recent Concern: Food Insecurity - Food Insecurity Present (11/16/2022)   Hunger Vital Sign    Worried About Running Out of Food in the Last Year: Sometimes true    Ran Out of Food in the Last Year: Sometimes true  Transportation Needs: No Transportation Needs (11/16/2022)   PRAPARE -  Administrator, Civil Service (Medical): No    Lack of Transportation (Non-Medical): No  Recent Concern: Transportation Needs - Unmet Transportation Needs (11/14/2022)   PRAPARE - Administrator, Civil Service (Medical): Yes    Lack of Transportation (Non-Medical): No  Physical Activity: Not on file  Stress: Not on file  Social Connections: Not on file  Intimate  Partner Violence: Not At Risk (11/14/2022)   Humiliation, Afraid, Rape, and Kick questionnaire    Fear of Current or Ex-Partner: No    Emotionally Abused: No    Physically Abused: No    Sexually Abused: No    Family History  Adopted: Yes    Vitals:   01/26/23 0847  BP: 124/82  Pulse: 69  SpO2: 97%  Weight: (!) 140.1 kg (308 lb 12.8 oz)    Wt Readings from Last 3 Encounters:  01/26/23 (!) 140.1 kg (308 lb 12.8 oz)  12/27/22 (!) 142.9 kg (315 lb)  12/20/22 (!) 143.2 kg (315 lb 11.2 oz)     PHYSICAL EXAM: General:  well appearing.  No respiratory difficulty. Walked in with walker HEENT: normal Neck: supple. JVD flat. Carotids 2+ bilat; no bruits. No lymphadenopathy or thyromegaly appreciated. Cor: PMI nondisplaced. Regular rate & rhythm. No rubs, gallops or murmurs. Lungs: clear Abdomen: soft, nontender, nondistended. No hepatosplenomegaly. No bruits or masses. Good bowel sounds. Extremities: no cyanosis, clubbing, rash, trace BLE edema  Neuro: alert & oriented x 3, cranial nerves grossly intact. moves all 4 extremities w/o difficulty. Affect pleasant.   ECG: No EKG today  ASSESSMENT & PLAN: 1. Chronic Systolic Heart Failure  - Echo 2/24 w/ newly reduced EF 30-35%, RV moderately reduced.  - Suspect tachy-mediated. She has not had an ischemic work up, though no h/o CP. May need cath if EF does not recover w/ mantanence of NSR - NYHA II, confounded by obesity and deconditioning. Not volume overloaded on exam today, weight lower today - continue Lasix 20 mg daily   - Continue Coreg 12.5 mg  bid, will not increase today with fatigue - Continue Entresto 24-26 mg twice a day. Will hold off on titration with intt dizziness.  - Continue spironolactone to 25 mg daily  - continue jardiance 10 mg daily, no GU symptoms - BMET today, echo with next visit, if EF <35% will need to consider referral to EP for ICD   2. PAF  - Had TEE/Cardioversion 10/2022 --> NSR. Back in Afib w/ RVR 3/24, spontaneously converted back to NSR - now on amiodarone, 200 mg bid. Check LFTs, TSH, Free T3 Free T4   - Will need yearly CXR, TSH Free T3 Free T4, LFTS, and eye exams.  - Discussed with patient. - long term therapy w/ amiodarone not ideal given young age. Will refer to Afib Clinic/EP for eval for other options. Weight/ BMI may prohibit ablation. ? Tikosyn  - Continue Eliquis BID. Denies abnormal bleeding. Check CBC today   3. H/o SVT - denies recent symptoms - Continue amiodarone + coreg - 7 day Zio with NSR, avg HR 78. Short bursts NSVT, longest 9.9 seconds. PVCs <1%, PACs 1.5%.  4. PFO  - Small PFA noted on TEE   5. DMII  -Hgb A1C 9.3  -on insulin, Metformin + Jardiance -followed by PCP    6. OSA - Untreated sleep apnea. Unable to afford CPAP.  - this will make control of Afib more difficult - wt loss would be beneficial   7. Obesity (Body mass index is 56.48 kg/m.) - has T2DM  - last visit referred to pharmacy for GLP-1, were not able to reach patient x3  8. Iron deficiency - reports recent fatigue. - Got IV iron 2/24, felt much better after that - check anemia panel today  F/u w/ Dr. Gala Romney in 6 weeks + echo  Alen Bleacher, NP 01/26/23

## 2023-01-26 NOTE — Patient Instructions (Addendum)
Thank you for coming in today  Labs were done today, if any labs are abnormal the clinic will call you No news is good news  Medications: No changes    Follow up appointments:  Your physician recommends that you schedule a follow-up appointment in:  6 weeks with echocardiogram with Dr. Gala Romney  Your physician has requested that you have an echocardiogram. Echocardiography is a painless test that uses sound waves to create images of your heart. It provides your doctor with information about the size and shape of your heart and how well your heart's chambers and valves are working. This procedure takes approximately one hour. There are no restrictions for this procedure.      Do the following things EVERYDAY: Weigh yourself in the morning before breakfast. Write it down and keep it in a log. Take your medicines as prescribed Eat low salt foods--Limit salt (sodium) to 2000 mg per day.  Stay as active as you can everyday Limit all fluids for the day to less than 2 liters   At the [facility], you and your health needs are our priority. As part of our continuing mission to provide you with exceptional heart care, we have created designated Provider Care Teams. These Care Teams include your primary Cardiologist (physician) and Advanced Practice Providers (APPs- Physician Assistants and Nurse Practitioners) who all work together to provide you with the care you need, when you need it.   You may see any of the following providers on your designated Care Team at your next follow up: Dr Arvilla Meres Dr Marca Ancona Dr. Marcos Eke, NP Robbie Lis, [place] [facility] [place], [place] Brynda Peon, NP Karle Plumber, PharmD   Please be sure to bring in all your medications bottles to every appointment.    Thank you for choosing  [facility]  If you have any questions or concerns before your next  appointment please send [place] a message through [place] or call our office at [phone].    TO LEAVE A MESSAGE FOR THE NURSE SELECT OPTION 2, PLEASE LEAVE A MESSAGE INCLUDING: YOUR NAME DATE OF BIRTH CALL BACK NUMBER REASON FOR CALL**this is important as we prioritize the call backs  YOU WILL RECEIVE A CALL BACK THE SAME DAY AS LONG AS YOU CALL BEFORE 4:00 PM

## 2023-01-27 LAB — T4: T4, Total: 5.6 ug/dL (ref 4.5–12.0)

## 2023-01-27 LAB — T3: T3, Total: 113 ng/dL (ref 71–180)

## 2023-01-30 ENCOUNTER — Telehealth (HOSPITAL_COMMUNITY): Payer: Self-pay | Admitting: *Deleted

## 2023-01-30 ENCOUNTER — Other Ambulatory Visit (HOSPITAL_COMMUNITY): Payer: Self-pay | Admitting: *Deleted

## 2023-01-30 ENCOUNTER — Telehealth: Payer: Self-pay | Admitting: Nurse Practitioner

## 2023-01-30 DIAGNOSIS — E064 Drug-induced thyroiditis: Secondary | ICD-10-CM

## 2023-01-30 DIAGNOSIS — I5022 Chronic systolic (congestive) heart failure: Secondary | ICD-10-CM

## 2023-01-30 DIAGNOSIS — E611 Iron deficiency: Secondary | ICD-10-CM

## 2023-01-30 MED ORDER — LEVOTHYROXINE SODIUM 25 MCG PO TABS: 25.0000 ug | ORAL_TABLET | Freq: Every day | ORAL | 6 refills | Status: DC

## 2023-01-30 NOTE — Telephone Encounter (Signed)
  Pt c/o medication issue:  1. Name of Medication:   DULoxetine (CYMBALTA) 60 MG capsule    2. How are you currently taking this medication (dosage and times per day)? Take 60 mg by mouth daily.   3. Are you having a reaction (difficulty breathing--STAT)? No   4. What is your medication issue? Pt said, her pcp left the practice and she in a process of getting a new pcp. She would like to request if Alden Server can send the prescription for her for the mean time. Her insurance covers 90 days supply

## 2023-01-30 NOTE — Telephone Encounter (Signed)
Left message for the patient to call the clinic.

## 2023-01-30 NOTE — Telephone Encounter (Signed)
Spoke to the patient, her PCP left the practice and pt will like to know if Alden Server will send a refill for Cymbalta. Asked pt if there is another MD covering previous PCP patients, she replied yes but " I was looking for another PCP before this one left". Pt mentioned she is completely out of Cymbalta. Will forward to APP for advise.

## 2023-01-30 NOTE — Telephone Encounter (Signed)
Patient returned LPN's call. Advised her per NP she would have to get the medication filled through her PCP office. Patient verbalized understanding.

## 2023-01-30 NOTE — Telephone Encounter (Signed)
Called patient per Brynda Peon, PA with following lab results and instructions:   "tSat low, please arrange for IV iron. TSH elevated, she is on amiodarone. Please start levothyroxine 25 mcg daily before breakfast. She will need close monitoring of her thyroid studies, I will forward labs to her PCP. If her PCP is unable to see her she will need endocrinology."   Per Karle Plumber, PharmD, based on her iron panel results, she will need 2 doses of IV Feraheme (510 mg each).   Pt verbalized understanding of above and agrees to 2 visits in our Infusion Clinic for IV iron infusions. Orders placed, will ask Philicia Branch, CMA to schedule first visit and obtain prior auth if necessary.   New Rx sent for synthroid. Pt says her current PCP has retired. Asked Lasandra Beech or Rosetta Posner, LCSW to reach out to pt to help with new PCP. Will refer to endocrinology if unable to set her up with PCP.

## 2023-01-31 ENCOUNTER — Telehealth (HOSPITAL_COMMUNITY): Payer: Self-pay | Admitting: Licensed Clinical Social Worker

## 2023-01-31 NOTE — Telephone Encounter (Signed)
CSW contacted patient to follow up on request regarding obtaining a PCP. Patient states that she has BC/BS and she was told that her insurance is mandating that she will need to seek an [facility] provider. CSW suggested that she contact [facility] to inquire about  PCP providers within their network. Patient verbalizes understanding and denies any further assistance at this time. Lasandra Beech, LCSW, CCSW-MCS [phone]

## 2023-02-08 ENCOUNTER — Other Ambulatory Visit: Payer: Self-pay

## 2023-02-08 MED ORDER — APIXABAN 5 MG PO TABS: 5.0000 mg | ORAL_TABLET | Freq: Two times a day (BID) | ORAL | 3 refills | Status: AC

## 2023-02-12 NOTE — Progress Notes (Deleted)
Office Visit    Patient Name: Debbie Lee Date of Encounter: 02/12/2023  Primary Care Provider:  Kallie Locks, FNP Primary Cardiologist:  Charlton Haws, MD Primary Electrophysiologist: None   Past Medical History    Past Medical History:  Diagnosis Date   Anemia    Arthritis    Depression    Hypertension    Panic attack    Past Surgical History:  Procedure Laterality Date   CARDIOVERSION N/A 11/18/2022   Procedure: CARDIOVERSION;  Surgeon: Chilton Si, MD;  Location: [facility] ENDOSCOPY;  Service: Cardiovascular;  Laterality: N/A;   ESOPHAGOGASTRODUODENOSCOPY N/A 11/15/2022   Procedure: ESOPHAGOGASTRODUODENOSCOPY (EGD);  Surgeon: Kathi Der, MD;  Location: [facility] ENDOSCOPY;  Service: Gastroenterology;  Laterality: N/A;   NO PAST SURGERIES     ORIF ANKLE FRACTURE Right 10/29/2019   Procedure: OPEN REDUCTION INTERNAL FIXATION (ORIF) ANKLE FRACTURE;  Surgeon: Sheral Apley, MD;  Location: [facility] OR;  Service: Orthopedics;  Laterality: Right;   TEE WITHOUT CARDIOVERSION N/A 11/18/2022   Procedure: TRANSESOPHAGEAL ECHOCARDIOGRAM (TEE);  Surgeon: Chilton Si, MD;  Location: [facility] ENDOSCOPY;  Service: Cardiovascular;  Laterality: N/A;    Allergies  Allergies  Allergen Reactions   Codeine Other (See Comments)    "Hallucinations. Narcotics cause vomiting."   Sulfa Antibiotics Other (See Comments)    "lifelong allergy"     History of Present Illness    Debbie Lee is a 52 y.o. female with PMH of atrial flutter with RVR s/p TEE/DCCV now on Eliquis, SVT, combined systolic and diastolic CHF, DM type II, HTN, anemia, morbid obesity, OSA (unable to afford CPAP )who presents today for 66-month follow-up.  Ms.Shannon was seen previously in 2022 with SVT which converted with adenosine.  She was seen in 07/2022 with complaint of shortness of breath and atrial flutter.  Patient was discharged with p.o. Cardizem with plan to follow-up with cardiology.   She presented to [facility] on  11/13/2022 with 2-1 atrial flutter and was placed on Cardizem drip with reduction in heart rate.  TSH was elevated and hemoglobin A1c was 9.3.  She converted from atrial flutter to atrial fibrillation and recommendation was made to start Eliquis and consider inpatient TEE with DCCV. Procedure was delayed due to EGD/colonoscopy and holding Eliquis prior to procedure for evaluation of anemia.  She underwent TEE/DCCV on 2/23 by Dr. Duke Salvia with TEE showing reduced EF of 30-35% with no left atrial thrombus and trivial MR/PR.  She converted to sinus rhythm with 1 shock at 200 J.  She was placed on GDMT with Entresto, spironolactone, carvedilol.  She was seen in follow-up on 12/02/2022 and was stable at that time but volume was still up.  She was started on Jardiance 10 mg and Lasix was increased for 3 days.  She was seen by Dr. Gala Romney on 12/20/2022 for [facility] visit.  During visit patient was complaining of shortness of breath with exertion and found to be in AF with RVR with a rate of 169.  She was also found to be volume overloaded and started on 80 mg of IV Lasix and was referred to the ED for further evaluation.  During her ED visit vital signs stabilized and patient was started on 40 of amiodarone twice daily x 7 days and 7-day ZIO monitor placed with follow-up in the AHF clinic.  She was seen by Boyce Medici, PA on 12/27/2022 and showed improvement with no significant dyspnea.  Volume status was difficult to ascertain due to body habitus.  Spironolactone was increased to 25 mg daily with plan to further titrate amiodarone down to 200 twice a day x 2 weeks then 20 mg daily.  She was referred to the AF clinic for further AAD options.   Since last being seen in the office patient reports***.  Patient denies chest pain, palpitations, dyspnea, PND, orthopnea, nausea, vomiting, dizziness, syncope, edema, weight gain, or early satiety.     ***Notes: -Patient may need left heart cath if EF does not increase with  maintaining sinus rhythm. -Patient has 6-week follow-up on 03/14/2023. Home Medications    Current Outpatient Medications  Medication Sig Dispense Refill   albuterol (VENTOLIN HFA) 108 (90 Base) MCG/ACT inhaler Inhale 2 puffs into the lungs every 6 (six) hours as needed for wheezing or shortness of breath. 18 g 2   amiodarone (PACERONE) 200 MG tablet Take 1 tablet (200 mg total) by mouth daily. 90 tablet 3   apixaban (ELIQUIS) 5 MG TABS tablet Take 1 tablet (5 mg total) by mouth 2 (two) times daily. 180 tablet 3   Blood Glucose Monitoring Suppl DEVI 1 each by Does not apply route in the morning, at noon, and at bedtime. May substitute to any manufacturer covered by patient's insurance. 1 each 0   carvedilol (COREG) 12.5 MG tablet Take 1 tablet (12.5 mg total) by mouth 2 (two) times daily with a meal. 60 tablet 2   DULoxetine (CYMBALTA) 60 MG capsule Take 60 mg by mouth daily.     empagliflozin (JARDIANCE) 10 MG TABS tablet Take 1 tablet (10 mg total) by mouth daily before breakfast. 30 tablet 3   furosemide (LASIX) 20 MG tablet TAKE 1 TABLET AS NEEDED FOR EDEMA OR FLUID (WEIGHT GAIN OF 2 LBS IN A DAY OR 5 LBS IN A WEEK). 90 tablet 3   insulin aspart (NOVOLOG) 100 UNIT/ML FlexPen CBG 70 - 120: 0 units  CBG 121 - 150: 1 unit  CBG 151 - 200: 2 units  CBG 201 - 250: 3 units  CBG 251 - 300: 5 units  CBG 301 - 350: 7 units  CBG 351 - 400: 9 units 15 mL 11   insulin isophane & regular human KwikPen (NOVOLIN 70/30 KWIKPEN) (70-30) 100 UNIT/ML KwikPen Inject 15 Units into the skin 2 (two) times daily. 15 mL 11   Insulin Pen Needle (PEN NEEDLES 3/16") 31G X 5 MM MISC Inject 15 Units into the skin 2 (two) times daily. 1000 each 2   levothyroxine (SYNTHROID) 25 MCG tablet Take 1 tablet (25 mcg total) by mouth daily before breakfast. 30 tablet 6   metFORMIN (GLUCOPHAGE) 500 MG tablet Take 1 tablet (500 mg total) by mouth 2 (two) times daily with a meal. 60 tablet 2   omeprazole (PRILOSEC) 20 MG capsule  Take 20 mg by mouth daily as needed (indigestion).     sacubitril-valsartan (ENTRESTO) 24-26 MG Take 1 tablet by mouth 2 (two) times daily. 60 tablet 2   spironolactone (ALDACTONE) 25 MG tablet Take 1 tablet (25 mg total) by mouth daily. 90 tablet 3   No current facility-administered medications for this visit.     Review of Systems  Please see the history of present illness.    (+)*** (+)***  All other systems reviewed and are otherwise negative except as noted above.  Physical Exam    Wt Readings from Last 3 Encounters:  01/26/23 (!) 308 lb 12.8 oz (140.1 kg)  12/27/22 (!) 315 lb (142.9 kg)  12/20/22 (!) 315 lb  11.2 oz (143.2 kg)   ZO:XWRUE were no vitals filed for this visit.,There is no height or weight on file to calculate BMI.  Constitutional:      Appearance: Healthy appearance. Not in distress.  Neck:     Vascular: JVD normal.  Pulmonary:     Effort: Pulmonary effort is normal.     Breath sounds: No wheezing. No rales. Diminished in the bases Cardiovascular:     Normal rate. Regular rhythm. Normal S1. Normal S2.      Murmurs: There is no murmur.  Edema:    Peripheral edema absent.  Abdominal:     Palpations: Abdomen is soft non tender. There is no hepatomegaly.  Skin:    General: Skin is warm and dry.  Neurological:     General: No focal deficit present.     Mental Status: Alert and oriented to person, place and time.     Cranial Nerves: Cranial nerves are intact.  EKG/LABS/ Recent Cardiac Studies    ECG personally reviewed by me today - ***  Cardiac Studies & Procedures       ECHOCARDIOGRAM  ECHOCARDIOGRAM COMPLETE 11/14/2022  Narrative ECHOCARDIOGRAM REPORT    Patient Name:   Debbie Lee Date of Exam: 11/14/2022 Medical Rec #:  [id number]   Height:       62.0 in Accession #:    [id number]  Weight:       310.0 lb Date of Birth:  1970/10/13   BSA:          2.303 m Patient Age:    51 years    BP:           122/72 mmHg Patient Gender: F           HR:            74 bpm. Exam Location:  Inpatient  Procedure: 2D Echo, Color Doppler and Cardiac Doppler  Indications:    I48.92* Unspecified atrial flutter  History:        Patient has no prior history of Echocardiogram examinations. Risk Factors:Hypertension.  Sonographer:    Mike Gip Referring Phys: [id number] ANASTASSIA DOUTOVA  IMPRESSIONS   1. Left ventricular ejection fraction, by estimation, is 30 to 35%. The left ventricle has moderately decreased function. The left ventricle demonstrates global hypokinesis. Left ventricular diastolic function could not be evaluated. 2. Right ventricular systolic function is moderately reduced. The right ventricular size is normal. There is normal pulmonary artery systolic pressure. 3. Left atrial size was mildly dilated. 4. Right atrial size was mildly dilated. 5. The mitral valve is normal in structure. No evidence of mitral valve regurgitation. No evidence of mitral stenosis. 6. The aortic valve is tricuspid. Aortic valve regurgitation is not visualized. No aortic stenosis is present. 7. The inferior vena cava is dilated in size with >50% respiratory variability, suggesting right atrial pressure of 8 mmHg.  FINDINGS Left Ventricle: Left ventricular ejection fraction, by estimation, is 30 to 35%. The left ventricle has moderately decreased function. The left ventricle demonstrates global hypokinesis. The left ventricular internal cavity size was normal in size. There is no left ventricular hypertrophy. Left ventricular diastolic function could not be evaluated due to atrial fibrillation. Left ventricular diastolic function could not be evaluated.  Right Ventricle: The right ventricular size is normal. No increase in right ventricular wall thickness. Right ventricular systolic function is moderately reduced. There is normal pulmonary artery systolic pressure. The tricuspid regurgitant velocity is 2.08 m/s, and with an assumed right  atrial pressure of  15 mmHg, the estimated right ventricular systolic pressure is 32.3 mmHg.  Left [facility]: Left atrial size was mildly dilated.  Right [facility]: Right atrial size was mildly dilated.  Pericardium: There is no evidence of pericardial effusion.  Mitral Valve: The mitral valve is normal in structure. No evidence of mitral valve regurgitation. No evidence of mitral valve stenosis.  Tricuspid Valve: The tricuspid valve is normal in structure. Tricuspid valve regurgitation is mild . No evidence of tricuspid stenosis.  Aortic Valve: The aortic valve is tricuspid. Aortic valve regurgitation is not visualized. No aortic stenosis is present. Aortic valve mean gradient measures 6.0 mmHg. Aortic valve peak gradient measures 9.5 mmHg. Aortic valve area, by VTI measures 2.38 cm.  Pulmonic Valve: The pulmonic valve was normal in structure. Pulmonic valve regurgitation is not visualized. No evidence of pulmonic stenosis.  Aorta: The aortic root is normal in size and structure.  Venous: The inferior vena cava is dilated in size with greater than 50% respiratory variability, suggesting right atrial pressure of 8 mmHg.  IAS/Shunts: No atrial level shunt detected by color flow Doppler.   LEFT VENTRICLE PLAX 2D LVIDd:         4.00 cm      Diastology LVIDs:         3.00 cm      LV e' medial:    10.80 cm/s LV PW:         1.10 cm      LV E/e' medial:  12.0 LV IVS:        1.20 cm      LV e' lateral:   15.20 cm/s LVOT diam:     2.10 cm      LV E/e' lateral: 8.6 LV SV:         70 LV SV Index:   30 LVOT Area:     3.46 cm  LV Volumes (MOD) LV vol d, MOD A2C: 112.0 ml LV vol d, MOD A4C: 96.0 ml LV vol s, MOD A2C: 53.6 ml LV vol s, MOD A4C: 46.9 ml LV SV MOD A2C:     58.4 ml LV SV MOD A4C:     96.0 ml LV SV MOD BP:      54.3 ml  RIGHT VENTRICLE             IVC RV Basal diam:  4.20 cm     IVC diam: 2.50 cm RV S prime:     10.30 cm/s TAPSE (M-mode): 1.7 cm  LEFT ATRIUM             Index        RIGHT  ATRIUM           Index LA diam:        3.80 cm 1.65 cm/m   RA Area:     19.60 cm LA Vol (A2C):   65.1 ml 28.27 ml/m  RA Volume:   52.10 ml  22.62 ml/m LA Vol (A4C):   57.5 ml 24.97 ml/m LA Biplane Vol: 62.1 ml 26.96 ml/m AORTIC VALVE AV Area (Vmax):    2.21 cm AV Area (Vmean):   2.16 cm AV Area (VTI):     2.38 cm AV Vmax:           154.00 cm/s AV Vmean:          114.000 cm/s AV VTI:            0.292 m AV Peak Grad:  9.5 mmHg AV Mean Grad:      6.0 mmHg LVOT Vmax:         98.30 cm/s LVOT Vmean:        71.100 cm/s LVOT VTI:          0.201 m LVOT/AV VTI ratio: 0.69  AORTA Ao Root diam: 2.90 cm Ao Asc diam:  3.00 cm  MITRAL VALVE                TRICUSPID VALVE MV Area (PHT): 5.16 cm     TR Peak grad:   17.3 mmHg MV Decel Time: 147 msec     TR Vmax:        208.00 cm/s MV E velocity: 130.00 cm/s SHUNTS Systemic VTI:  0.20 m Systemic Diam: 2.10 cm  Arvilla Meres MD Electronically signed by Arvilla Meres MD Signature Date/Time: 11/14/2022/4:01:22 PM    Final   TEE  ECHO TEE 11/18/2022  Narrative TRANSESOPHOGEAL ECHO REPORT    Patient Name:   Debbie Lee Date of Exam: 11/18/2022 Medical Rec #:  [id number]   Height:       62.0 in Accession #:    [id number]  Weight:       327.6 lb Date of Birth:  06-06-1971   BSA:          2.358 m Patient Age:    51 years    BP:           112/74 mmHg Patient Gender: F           HR:           146 bpm. Exam Location:  Inpatient  Procedure: Transesophageal Echo, Cardiac Doppler and Color Doppler  Indications:     ; I48.92* Unspecified atrial flutter  History:         Patient has prior history of Echocardiogram examinations, most recent 11/14/2022. Abnormal ECG, Arrythmias:Atrial Flutter; Risk Factors:Diabetes and Hypertension.  Sonographer:     Sheralyn Boatman RDCS Referring Phys:  [id number] HAO MENG Diagnosing Phys: Chilton Si MD  PROCEDURE: After discussion of the risks and benefits of a TEE, an informed consent  was obtained from the patient. The transesophogeal probe was passed without difficulty through the esophogus of the patient. Imaged were obtained with the patient in a left lateral decubitus position. Sedation performed by different physician. The patient was monitored while under deep sedation. Anesthestetic sedation was provided intravenously by Anesthesiology: 905mg  of Propofol. The patient's vital signs; including heart rate, blood pressure, and oxygen saturation; remained stable throughout the procedure. The patient developed no complications during the procedure. A successful direct current cardioversion was performed at 200 joules with 1 attempt.  IMPRESSIONS   1. Left ventricular ejection fraction, by estimation, is 30 to 35%. The left ventricle has moderately decreased function. The left ventricle demonstrates global hypokinesis. 2. Right ventricular systolic function is normal. The right ventricular size is normal. 3. No left atrial/left atrial appendage thrombus was detected. 4. The mitral valve is normal in structure. Trivial mitral valve regurgitation. No evidence of mitral stenosis. 5. The aortic valve is normal in structure. Aortic valve regurgitation is not visualized. No aortic stenosis is present. 6. The inferior vena cava is normal in size with greater than 50% respiratory variability, suggesting right atrial pressure of 3 mmHg. 7. Small PFO with left to right shunting at baseline. Bubble study was not obtained due to tenuous IV and patient respiratory status. There is a small patent foramen ovale with predominantly left  to right shunting across the atrial septum.  FINDINGS Left Ventricle: Left ventricular ejection fraction, by estimation, is 30 to 35%. The left ventricle has moderately decreased function. The left ventricle demonstrates global hypokinesis. The left ventricular internal cavity size was normal in size. There is no left ventricular hypertrophy.  Right Ventricle:  The right ventricular size is normal. No increase in right ventricular wall thickness. Right ventricular systolic function is normal.  Left [facility]: Left atrial size was normal in size. No left atrial/left atrial appendage thrombus was detected.  Right [facility]: Right atrial size was normal in size.  Pericardium: There is no evidence of pericardial effusion.  Mitral Valve: The mitral valve is normal in structure. Trivial mitral valve regurgitation. No evidence of mitral valve stenosis.  Tricuspid Valve: The tricuspid valve is normal in structure. Tricuspid valve regurgitation is trivial. No evidence of tricuspid stenosis.  Aortic Valve: The aortic valve is normal in structure. Aortic valve regurgitation is not visualized. No aortic stenosis is present.  Pulmonic Valve: The pulmonic valve was normal in structure. Pulmonic valve regurgitation is not visualized. No evidence of pulmonic stenosis.  Aorta: The aortic root is normal in size and structure.  Venous: The inferior vena cava is normal in size with greater than 50% respiratory variability, suggesting right atrial pressure of 3 mmHg.  IAS/Shunts: No atrial level shunt detected by color flow Doppler. A small patent foramen ovale is detected with predominantly left to right shunting across the atrial septum. Small PFO with left to right shunting at baseline. Bubble study was not obtained due to tenuous IV and patient respiratory status.  Additional Comments: Spectral Doppler performed.  TRICUSPID VALVE TR Peak grad:   14.9 mmHg TR Vmax:        193.00 cm/s  Chilton Si MD Electronically signed by Chilton Si MD Signature Date/Time: 11/18/2022/2:25:17 PM    Final            Risk Assessment/Calculations:   {Does this patient have ATRIAL FIBRILLATION?:[phone]}        Lab Results  Component Value Date   WBC 9.9 01/26/2023   HGB 12.1 01/26/2023   HCT 39.8 01/26/2023   MCV 81.4 01/26/2023   PLT 301 01/26/2023    Lab Results  Component Value Date   CREATININE 0.89 01/26/2023   BUN 12 01/26/2023   NA 136 01/26/2023   K 4.3 01/26/2023   CL 100 01/26/2023   CO2 26 01/26/2023   Lab Results  Component Value Date   ALT 23 01/26/2023   AST 23 01/26/2023   ALKPHOS 60 01/26/2023   BILITOT 0.8 01/26/2023   No results found for: "CHOL", "HDL", "LDLCALC", "LDLDIRECT", "TRIG", "CHOLHDL"  Lab Results  Component Value Date   HGBA1C 9.3 (H) 11/14/2022     Assessment & Plan    1.  Combined systolic and diastolic CHF: -Patient had TEE/DCCV performed due to atrial flutter that revealed EF of 30-35% with moderately decreased LV function and global hypokinesis -Continue GDMT with carvedilol 12.5 mg twice daily, Entresto 24/26 mg daily, Aldactone 25 mg daily and Jardiance as noted above.  -Low sodium diet, fluid restriction <2L, and daily weights encouraged. Educated to contact our office for weight gain of 2 lbs overnight or 5 lbs in one week.    2.  Atrial flutter/atrial fibrillation: -Patient recently seen in the ED with AF with RVR and volume overloaded.  Treated with Lasix and started on amiodarone with the monitor placed x 7 days.  Patient seen in  TOC for follow-up and maintaining sinus rhythm with ZIO showing predominantly sinus rhythm. -Today patient is*** -She is scheduled for follow-up on 6/*** -Continue Eliquis 5 mg twice daily   3.  Essential hypertension: -Patient's blood pressures today is***  4.  DM type II: -Newly diagnosed during hospitalization with hemoglobin A1c of 9.3%  -Jardiance 10 mg daily added to current GDMT   5.  Morbid obesity: -Patient's BMI is 58.71 -Patient was advised to reduce calorie intake and increase physical activity as tolerate      Disposition: Follow-up with Charlton Haws, MD or APP in *** months {Are you ordering a CV Procedure (e.g. stress test, cath, DCCV, TEE, etc)?   Press F2        :[id number]}   Medication Adjustments/Labs and Tests  Ordered: Current medicines are reviewed at length with the patient today.  Concerns regarding medicines are outlined above.   Signed, Napoleon Form, Leodis Rains, NP 02/12/2023, 12:19 PM [facility]

## 2023-02-13 ENCOUNTER — Ambulatory Visit: Payer: BLUE CROSS/BLUE SHIELD | Admitting: Nurse Practitioner

## 2023-02-13 DIAGNOSIS — I4892 Unspecified atrial flutter: Secondary | ICD-10-CM

## 2023-02-13 DIAGNOSIS — I10 Essential (primary) hypertension: Secondary | ICD-10-CM

## 2023-02-13 DIAGNOSIS — I5043 Acute on chronic combined systolic (congestive) and diastolic (congestive) heart failure: Secondary | ICD-10-CM

## 2023-02-13 DIAGNOSIS — E119 Type 2 diabetes mellitus without complications: Secondary | ICD-10-CM

## 2023-02-16 ENCOUNTER — Other Ambulatory Visit (HOSPITAL_COMMUNITY): Payer: Self-pay | Admitting: *Deleted

## 2023-02-22 MED ORDER — SACUBITRIL-VALSARTAN 24-26 MG PO TABS: 1.0000 | ORAL_TABLET | Freq: Two times a day (BID) | ORAL | 3 refills | Status: DC

## 2023-02-23 ENCOUNTER — Other Ambulatory Visit: Payer: Self-pay

## 2023-02-23 ENCOUNTER — Telehealth: Payer: Self-pay

## 2023-02-23 MED ORDER — CARVEDILOL 12.5 MG PO TABS: 12.5000 mg | ORAL_TABLET | Freq: Two times a day (BID) | ORAL | 3 refills | Status: DC

## 2023-02-23 NOTE — Telephone Encounter (Signed)
Coreg refilled per Inda Coke, NP.

## 2023-02-24 ENCOUNTER — Encounter (HOSPITAL_COMMUNITY): Payer: BLUE CROSS/BLUE SHIELD

## 2023-02-27 ENCOUNTER — Encounter (HOSPITAL_COMMUNITY)
Admission: RE | Admit: 2023-02-27 | Discharge: 2023-02-27 | Disposition: A | Discharge status: Home / Self Care | Payer: BLUE CROSS/BLUE SHIELD | Source: Ambulatory Visit | Attending: Internal Medicine | Admitting: Internal Medicine

## 2023-02-27 DIAGNOSIS — E611 Iron deficiency: Secondary | ICD-10-CM | POA: Insufficient documentation

## 2023-02-27 DIAGNOSIS — I5022 Chronic systolic (congestive) heart failure: Secondary | ICD-10-CM | POA: Insufficient documentation

## 2023-02-27 MED ORDER — SODIUM CHLORIDE 0.9 % IV SOLN
510.0000 mg | INTRAVENOUS | Status: DC
  Administered 2023-02-27: 510 mg via INTRAVENOUS
  Filled 2023-02-27: qty 510

## 2023-03-06 ENCOUNTER — Ambulatory Visit (HOSPITAL_COMMUNITY)
Admission: RE | Admit: 2023-03-06 | Discharge: 2023-03-06 | Disposition: A | Discharge status: Home / Self Care | Payer: BLUE CROSS/BLUE SHIELD | Source: Ambulatory Visit | Attending: Internal Medicine | Admitting: Internal Medicine

## 2023-03-06 DIAGNOSIS — E611 Iron deficiency: Secondary | ICD-10-CM | POA: Diagnosis present

## 2023-03-06 DIAGNOSIS — I5022 Chronic systolic (congestive) heart failure: Secondary | ICD-10-CM | POA: Insufficient documentation

## 2023-03-06 MED ORDER — SODIUM CHLORIDE 0.9 % IV SOLN
510.0000 mg | INTRAVENOUS | Status: DC
  Administered 2023-03-06: 510 mg via INTRAVENOUS
  Filled 2023-03-06: qty 510

## 2023-03-07 ENCOUNTER — Ambulatory Visit: Payer: BLUE CROSS/BLUE SHIELD | Admitting: Physician Assistant

## 2023-03-13 ENCOUNTER — Other Ambulatory Visit: Payer: Self-pay

## 2023-03-13 ENCOUNTER — Emergency Department (HOSPITAL_COMMUNITY)
Admission: EM | Admit: 2023-03-13 | Discharge: 2023-03-13 | Disposition: A | Discharge status: Home / Self Care | Payer: BLUE CROSS/BLUE SHIELD | Attending: Emergency Medicine | Admitting: Emergency Medicine

## 2023-03-13 DIAGNOSIS — R112 Nausea with vomiting, unspecified: Secondary | ICD-10-CM | POA: Diagnosis present

## 2023-03-13 DIAGNOSIS — R7989 Other specified abnormal findings of blood chemistry: Secondary | ICD-10-CM | POA: Insufficient documentation

## 2023-03-13 DIAGNOSIS — D72829 Elevated white blood cell count, unspecified: Secondary | ICD-10-CM | POA: Insufficient documentation

## 2023-03-13 DIAGNOSIS — Z79899 Other long term (current) drug therapy: Secondary | ICD-10-CM | POA: Diagnosis not present

## 2023-03-13 DIAGNOSIS — I10 Essential (primary) hypertension: Secondary | ICD-10-CM | POA: Insufficient documentation

## 2023-03-13 DIAGNOSIS — A084 Viral intestinal infection, unspecified: Secondary | ICD-10-CM | POA: Diagnosis not present

## 2023-03-13 DIAGNOSIS — Z794 Long term (current) use of insulin: Secondary | ICD-10-CM | POA: Diagnosis not present

## 2023-03-13 LAB — COMPREHENSIVE METABOLIC PANEL
ALT: 23 U/L (ref 0–44)
AST: 18 U/L (ref 15–41)
Albumin: 3.9 g/dL (ref 3.5–5.0)
Alkaline Phosphatase: 58 U/L (ref 38–126)
Anion gap: 9 (ref 5–15)
BUN: 19 mg/dL (ref 6–20)
CO2: 28 mmol/L (ref 22–32)
Calcium: 8.9 mg/dL (ref 8.9–10.3)
Chloride: 102 mmol/L (ref 98–111)
Creatinine, Ser: 1.09 mg/dL — ABNORMAL HIGH (ref 0.44–1.00)
GFR, Estimated: >60 mL/min (ref >60)
Glucose, Bld: 138 mg/dL — ABNORMAL HIGH (ref 70–99)
Potassium: 4.1 mmol/L (ref 3.5–5.1)
Sodium: 139 mmol/L (ref 135–145)
Total Bilirubin: 1.2 mg/dL (ref 0.3–1.2)
Total Protein: 8 g/dL (ref 6.5–8.1)

## 2023-03-13 LAB — I-STAT BETA HCG BLOOD, ED (MC, WL, AP ONLY): I-stat hCG, quantitative: <5 m[IU]/mL (ref <5)

## 2023-03-13 LAB — CBC
HCT: 46.9 % — ABNORMAL HIGH (ref 36.0–46.0)
Hemoglobin: 14.3 g/dL (ref 12.0–15.0)
MCH: 27.2 pg (ref 26.0–34.0)
MCHC: 30.5 g/dL (ref 30.0–36.0)
MCV: 89.2 fL (ref 80.0–100.0)
Platelets: 272 10*3/uL (ref 150–400)
RBC: 5.26 MIL/uL — ABNORMAL HIGH (ref 3.87–5.11)
RDW: 18.1 % — ABNORMAL HIGH (ref 11.5–15.5)
WBC: 12.5 10*3/uL — ABNORMAL HIGH (ref 4.0–10.5)
nRBC: 0 % (ref 0.0–0.2)

## 2023-03-13 LAB — URINALYSIS, ROUTINE W REFLEX MICROSCOPIC
Bilirubin Urine: NEGATIVE
Glucose, UA: >=500 mg/dL — AB
Ketones, ur: 5 mg/dL — AB
Leukocytes,Ua: NEGATIVE
Nitrite: NEGATIVE
Protein, ur: NEGATIVE mg/dL
Specific Gravity, Urine: 1.026 (ref 1.005–1.030)
pH: 6 (ref 5.0–8.0)

## 2023-03-13 LAB — LIPASE, BLOOD: Lipase: 37 U/L (ref 11–51)

## 2023-03-13 MED ORDER — SODIUM CHLORIDE 0.9 % IV BOLUS
1000.0000 mL | Freq: Once | INTRAVENOUS | Status: AC
  Administered 2023-03-13: 1000 mL via INTRAVENOUS

## 2023-03-13 MED ORDER — ONDANSETRON HCL 4 MG/2ML IJ SOLN
4.0000 mg | Freq: Once | INTRAMUSCULAR | Status: AC
  Administered 2023-03-13: 4 mg via INTRAVENOUS
  Filled 2023-03-13: qty 2

## 2023-03-13 MED ORDER — SODIUM CHLORIDE 0.9 % IV SOLN
12.5000 mg | Freq: Once | INTRAVENOUS | Status: AC
  Administered 2023-03-13: 12.5 mg via INTRAVENOUS
  Filled 2023-03-13: qty 12.5

## 2023-03-13 MED ORDER — PROMETHAZINE HCL 25 MG PO TABS: 25.0000 mg | ORAL_TABLET | Freq: Four times a day (QID) | ORAL | 0 refills | Status: DC | PRN

## 2023-03-13 NOTE — ED Notes (Signed)
Patient was able to tolerate crackers and sprite. Upon discharge

## 2023-03-13 NOTE — ED Provider Notes (Signed)
[place] EMERGENCY DEPARTMENT AT [facility] Provider Note   CSN: [id number] Arrival date & time: 03/13/23  4098     History  Chief Complaint  Patient presents with   Nausea    Debbie Lee is a 52 y.o. female with history of anemia, depression, panic attacks, HTN, and arthritis who presents to the ER complaining of nausea, vomiting, and diarrhea starting last night. Reports 3 episodes of nonbloody nonbilious emesis. Concerned for food poisoning. She thinks it could have been from an impossible burger she ate last night but she's not sure. Reports hx of similar. Denies any fevers. Has some generalized abdominal soreness after vomiting.   HPI     Home Medications Prior to Admission medications   Medication Sig Start Date End Date Taking? Authorizing Provider  promethazine (PHENERGAN) 25 MG tablet Take 1 tablet (25 mg total) by mouth every 6 (six) hours as needed for nausea or vomiting. 03/13/23  Yes Bary Limbach T, PA-C  albuterol (VENTOLIN HFA) 108 (90 Base) MCG/ACT inhaler Inhale 2 puffs into the lungs every 6 (six) hours as needed for wheezing or shortness of breath. 11/20/22   Kathlen Mody, MD  amiodarone (PACERONE) 200 MG tablet Take 1 tablet (200 mg total) by mouth daily. 01/26/23   Alen Bleacher, NP  apixaban (ELIQUIS) 5 MG TABS tablet Take 1 tablet (5 mg total) by mouth 2 (two) times daily. 02/08/23   Gaston Islam., NP  Blood Glucose Monitoring Suppl DEVI 1 each by Does not apply route in the morning, at noon, and at bedtime. May substitute to any manufacturer covered by patient's insurance. 11/20/22   Kathlen Mody, MD  carvedilol (COREG) 12.5 MG tablet Take 1 tablet (12.5 mg total) by mouth 2 (two) times daily with a meal. 02/23/23   Gaston Islam., NP  DULoxetine (CYMBALTA) 60 MG capsule Take 60 mg by mouth daily. 10/22/20   [provider]  empagliflozin (JARDIANCE) 10 MG TABS tablet Take 1 tablet (10 mg total) by mouth daily before breakfast.  12/02/22   Gaston Islam., NP  furosemide (LASIX) 20 MG tablet TAKE 1 TABLET AS NEEDED FOR EDEMA OR FLUID (WEIGHT GAIN OF 2 LBS IN A DAY OR 5 LBS IN A WEEK). 01/06/23   Gaston Islam., NP  insulin aspart (NOVOLOG) 100 UNIT/ML FlexPen CBG 70 - 120: 0 units  CBG 121 - 150: 1 unit  CBG 151 - 200: 2 units  CBG 201 - 250: 3 units  CBG 251 - 300: 5 units  CBG 301 - 350: 7 units  CBG 351 - 400: 9 units 11/20/22   Kathlen Mody, MD  insulin isophane & regular human KwikPen (NOVOLIN 70/30 KWIKPEN) (70-30) 100 UNIT/ML KwikPen Inject 15 Units into the skin 2 (two) times daily. 11/20/22   Kathlen Mody, MD  Insulin Pen Needle (PEN NEEDLES 3/16") 31G X 5 MM MISC Inject 15 Units into the skin 2 (two) times daily. 12/15/22   Russella Dar, NP  levothyroxine (SYNTHROID) 25 MCG tablet Take 1 tablet (25 mcg total) by mouth daily before breakfast. 01/30/23   Alen Bleacher, NP  metFORMIN (GLUCOPHAGE) 500 MG tablet Take 1 tablet (500 mg total) by mouth 2 (two) times daily with a meal. 11/20/22   Kathlen Mody, MD  omeprazole (PRILOSEC) 20 MG capsule Take 20 mg by mouth daily as needed (indigestion).    [provider]  sacubitril-valsartan (ENTRESTO) 24-26 MG Take 1 tablet by mouth  2 (two) times daily. 02/22/23   Gaston Islam., NP  spironolactone (ALDACTONE) 25 MG tablet Take 1 tablet (25 mg total) by mouth daily. 12/27/22   Allayne Butcher, PA-C      Allergies    Codeine and Sulfa antibiotics    Review of Systems   Review of Systems  Constitutional:  Negative for fever.  Gastrointestinal:  Positive for abdominal pain, diarrhea, nausea and vomiting. Negative for blood in stool.  All other systems reviewed and are negative.   Physical Exam Updated Vital Signs BP (!) 168/86 (BP Location: Right Arm)   Pulse 85   Temp 98.4 F (36.9 C) (Oral)   Resp 18   Ht 5\' 2"  (1.575 m)   Wt 136.1 kg   SpO2 100%   BMI 54.87 kg/m  Physical Exam Vitals and nursing note reviewed.  Constitutional:       Appearance: Normal appearance.  HENT:     Head: Normocephalic and atraumatic.  Eyes:     Conjunctiva/sclera: Conjunctivae normal.  Cardiovascular:     Rate and Rhythm: Normal rate and regular rhythm.  Pulmonary:     Effort: Pulmonary effort is normal. No respiratory distress.     Breath sounds: Normal breath sounds.  Abdominal:     General: There is no distension.     Palpations: Abdomen is soft.     Tenderness: There is generalized abdominal tenderness.  Skin:    General: Skin is warm and dry.  Neurological:     General: No focal deficit present.     Mental Status: She is alert.     ED Results / Procedures / Treatments   Labs (all labs ordered are listed, but only abnormal results are displayed) Labs Reviewed  CBC - Abnormal; Notable for the following components:      Result Value   WBC 12.5 (*)    RBC 5.26 (*)    HCT 46.9 (*)    RDW 18.1 (*)    All other components within normal limits  URINALYSIS, ROUTINE W REFLEX MICROSCOPIC - Abnormal; Notable for the following components:   Glucose, UA >=500 (*)    Hgb urine dipstick MODERATE (*)    Ketones, ur 5 (*)    Bacteria, UA RARE (*)    All other components within normal limits  COMPREHENSIVE METABOLIC PANEL - Abnormal; Notable for the following components:   Glucose, Bld 138 (*)    Creatinine, Ser 1.09 (*)    All other components within normal limits  LIPASE, BLOOD  I-STAT BETA HCG BLOOD, ED ([facility], [facility], [facility] ONLY)    EKG None  Radiology No results found.  Procedures Procedures    Medications Ordered in ED Medications  sodium chloride 0.9 % bolus 1,000 mL (0 mLs Intravenous Stopped 03/13/23 0900)  ondansetron (ZOFRAN) injection 4 mg (4 mg Intravenous Given 03/13/23 0744)  promethazine (PHENERGAN) 12.5 mg in sodium chloride 0.9 % 50 mL IVPB (12.5 mg Intravenous New Bag/Given 03/13/23 1057)    ED Course/ Medical Decision Making/ A&P                             Medical Decision Making Risk Prescription drug  management.   This patient is a 52 y.o. female  who presents to the ED for concern of nausea, vomiting, diarrhea since last night.   Differential diagnoses prior to evaluation: The emergent differential diagnosis includes, but is not limited to,  ACS/MI, Boerhaave's,  DKA, elevated ICP, Ischemic bowel, Sepsis, Drug-related (toxicity, THC hyperemesis, ETOH, withdrawal), Appendicitis, Bowel obstruction, Electrolyte abnormalities, Pancreatitis, Biliary colic, Gastroenteritis, Gastroparesis, Hepatitis, Migraine, Thyroid disease, Renal colic, GERD/PUD, UTI. This is not an exhaustive differential.   Past Medical History / Co-morbidities / Social History: anemia, depression, panic attacks, HTN, and arthritis  Physical Exam: Physical exam performed. The pertinent findings include: Normal vital signs, no distress. Abdomen soft with generalized tenderness, overall benign exam.  Lab Tests/Imaging studies: I personally interpreted labs/imaging and the pertinent results include: Mild leukocytosis of 12.5, normal hemoglobin.  CMP grossly unremarkable, creatinine mildly elevated compared to prior, and not at level of AKI.  UA with moderate hemoglobin, negative for infection.  Normal lipase.  Negative pregnancy.   Patient is clinically well-appearing with a benign abdominal exam, reassuring laboratory evaluation, so we will defer abdominal imaging at this time.  Medications: I ordered medication including IV fluids, Zofran, Phenergan.  I have reviewed the patients home medicines and have made adjustments as needed.  Upon reevaluation patient has passed p.o. challenge, is able to keep down fluids and crackers.   Disposition: After consideration of the diagnostic results and the patients response to treatment, I feel that emergency department workup does not suggest an emergent condition requiring admission or immediate intervention beyond what has been performed at this time. The plan is: Discharge home with  symptomatic management of likely viral gastroenteritis.  Now tolerating p.o.  Will send prescription for Phenergan to the pharmacy.    The patient is safe for discharge and has been instructed to return immediately for worsening symptoms, change in symptoms or any other concerns.  Final Clinical Impression(s) / ED Diagnoses Final diagnoses:  Nausea and vomiting, unspecified vomiting type  Viral gastroenteritis    Rx / DC Orders ED Discharge Orders          Ordered    promethazine (PHENERGAN) 25 MG tablet  Every 6 hours PRN        03/13/23 1258           Portions of this report may have been transcribed using voice recognition software. Every effort was made to ensure accuracy; however, inadvertent computerized transcription errors may be present.    Jeanella Flattery 03/13/23 1303    Gerhard Munch, MD 03/13/23 1350

## 2023-03-13 NOTE — ED Triage Notes (Signed)
Pt BIB [facility] for n/v/d that began last night. Pt reports 3 episodes of vomiting.

## 2023-03-13 NOTE — ED Notes (Signed)
Sent two Lt green and one Dark geen to lab

## 2023-03-13 NOTE — Discharge Instructions (Addendum)
You were seen in the ER for nausea and vomiting.  Your blood work was reassuring today. We have given you IV fluids and nausea medication. I have sent a prescription for the same nausea medication you received in the ER to your pharmacy on file.  I recommend sticking to a bland diet as tolerated to continue to give your stomach time to rest.   Continue to monitor how you're doing and return to the ER for new or worsening symptoms.

## 2023-03-13 NOTE — ED Notes (Signed)
Pt not able to give urine sample at this time  

## 2023-03-14 ENCOUNTER — Ambulatory Visit (HOSPITAL_COMMUNITY)
Admission: RE | Admit: 2023-03-14 | Discharge: 2023-03-14 | Disposition: A | Discharge status: Home / Self Care | Payer: BLUE CROSS/BLUE SHIELD | Source: Ambulatory Visit | Attending: Internal Medicine | Admitting: Internal Medicine

## 2023-03-14 ENCOUNTER — Ambulatory Visit (HOSPITAL_BASED_OUTPATIENT_CLINIC_OR_DEPARTMENT_OTHER)
Admission: RE | Admit: 2023-03-14 | Discharge: 2023-03-14 | Disposition: A | Discharge status: Home / Self Care | Payer: BLUE CROSS/BLUE SHIELD | Source: Ambulatory Visit | Attending: Internal Medicine | Admitting: Internal Medicine

## 2023-03-14 ENCOUNTER — Encounter (HOSPITAL_COMMUNITY): Payer: Self-pay | Admitting: Internal Medicine

## 2023-03-14 VITALS — BP 110/70 | HR 69 | Wt 297.6 lb

## 2023-03-14 DIAGNOSIS — Z5982 Transportation insecurity: Secondary | ICD-10-CM | POA: Diagnosis not present

## 2023-03-14 DIAGNOSIS — Z6841 Body Mass Index (BMI) 40.0 and over, adult: Secondary | ICD-10-CM | POA: Diagnosis not present

## 2023-03-14 DIAGNOSIS — I480 Paroxysmal atrial fibrillation: Secondary | ICD-10-CM

## 2023-03-14 DIAGNOSIS — I471 Supraventricular tachycardia, unspecified: Secondary | ICD-10-CM | POA: Diagnosis not present

## 2023-03-14 DIAGNOSIS — Z87891 Personal history of nicotine dependence: Secondary | ICD-10-CM | POA: Diagnosis not present

## 2023-03-14 DIAGNOSIS — Z7984 Long term (current) use of oral hypoglycemic drugs: Secondary | ICD-10-CM | POA: Insufficient documentation

## 2023-03-14 DIAGNOSIS — G4733 Obstructive sleep apnea (adult) (pediatric): Secondary | ICD-10-CM

## 2023-03-14 DIAGNOSIS — Z794 Long term (current) use of insulin: Secondary | ICD-10-CM | POA: Diagnosis not present

## 2023-03-14 DIAGNOSIS — E669 Obesity, unspecified: Secondary | ICD-10-CM | POA: Insufficient documentation

## 2023-03-14 DIAGNOSIS — I5022 Chronic systolic (congestive) heart failure: Secondary | ICD-10-CM | POA: Diagnosis present

## 2023-03-14 DIAGNOSIS — Z7901 Long term (current) use of anticoagulants: Secondary | ICD-10-CM | POA: Insufficient documentation

## 2023-03-14 DIAGNOSIS — Z79899 Other long term (current) drug therapy: Secondary | ICD-10-CM | POA: Diagnosis not present

## 2023-03-14 DIAGNOSIS — I110 Hypertensive heart disease with heart failure: Secondary | ICD-10-CM | POA: Insufficient documentation

## 2023-03-14 DIAGNOSIS — E119 Type 2 diabetes mellitus without complications: Secondary | ICD-10-CM | POA: Insufficient documentation

## 2023-03-14 DIAGNOSIS — Z5986 Financial insecurity: Secondary | ICD-10-CM | POA: Diagnosis not present

## 2023-03-14 DIAGNOSIS — Q2112 Patent foramen ovale: Secondary | ICD-10-CM | POA: Diagnosis not present

## 2023-03-14 LAB — ECHOCARDIOGRAM COMPLETE
Area-P 1/2: 3.83 cm2
Calc EF: 53.3 %
S' Lateral: 2.8 cm
Single Plane A2C EF: 59.6 %
Single Plane A4C EF: 52 %

## 2023-03-14 MED ORDER — AMIODARONE HCL 200 MG PO TABS
200.0000 mg | ORAL_TABLET | Freq: Every day | ORAL | 3 refills | Status: DC
Start: 2023-03-14 — End: 2024-03-06

## 2023-03-14 NOTE — Progress Notes (Signed)
[facility] Clinic Note   Referring Physician: PCP: Patient, No Pcp Per PCP-Cardiologist: Charlton Haws, MD  [facility]: Dr. Gala Romney   HPI:  Ms Kniep is a 52 year old with a history of OSA, SVT, A flutter, IDA, DM2, obesity, and chronic HFrEF.    Admitted 11/13/22 with A flutter RVR. Echo showed reduced EF 30-35%. 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 TEE/DC-CV with shock x1. Converted to SR. Started on GDMT and eliquis. Discharged 11/20/22. D/c wt was 319 lb.   Had 1st TOC clinic 3/26 and complained of increasing SOB and found to be in atypical AFL/AT with HR 169. . Was sent to the ED for IV Lasix and IV amiodarone but ultimately spontaneously converted back to NSR before getting started on amio gtt. Had F/u in [facility] and was stable  Went to ER yesterday with n/v/ Work-up unremarkable. Felt to have viral gastroenteritis   Here for f/u. Feeling better. Says overall doing OK. Not overly active. SOB with moderate exertion. No edema, orthopnea or PND. No palpitations (doesn't feel AF). Compliant with meds. Takes extra lasix as needed.   Echo today 6/18 EF  60-65% Personally reviewed    Past Medical History:  Diagnosis Date   Anemia    Arthritis    Depression    Hypertension    Panic attack     Current Outpatient Medications  Medication Sig Dispense Refill   albuterol (VENTOLIN HFA) 108 (90 Base) MCG/ACT inhaler Inhale 2 puffs into the lungs every 6 (six) hours as needed for wheezing or shortness of breath. 18 g 2   amiodarone (PACERONE) 200 MG tablet Take 1 tablet (200 mg total) by mouth daily. 90 tablet 3   apixaban (ELIQUIS) 5 MG TABS tablet Take 1 tablet (5 mg total) by mouth 2 (two) times daily. 180 tablet 3   Blood Glucose Monitoring Suppl DEVI 1 each by Does not apply route in the morning, at noon, and at bedtime. May substitute to any manufacturer covered by patient's insurance. 1 each 0   carvedilol (COREG) 12.5 MG tablet Take 1 tablet (12.5 mg total) by mouth  2 (two) times daily with a meal. 180 tablet 3   DULoxetine (CYMBALTA) 60 MG capsule Take 60 mg by mouth daily.     empagliflozin (JARDIANCE) 25 MG TABS tablet Take 25 mg by mouth daily.     furosemide (LASIX) 20 MG tablet TAKE 1 TABLET AS NEEDED FOR EDEMA OR FLUID (WEIGHT GAIN OF 2 LBS IN A DAY OR 5 LBS IN A WEEK). 90 tablet 3   insulin aspart (NOVOLOG) 100 UNIT/ML FlexPen CBG 70 - 120: 0 units  CBG 121 - 150: 1 unit  CBG 151 - 200: 2 units  CBG 201 - 250: 3 units  CBG 251 - 300: 5 units  CBG 301 - 350: 7 units  CBG 351 - 400: 9 units 15 mL 11   insulin isophane & regular human KwikPen (NOVOLIN 70/30 KWIKPEN) (70-30) 100 UNIT/ML KwikPen Inject 15 Units into the skin 2 (two) times daily. 15 mL 11   Insulin Pen Needle (PEN NEEDLES 3/16") 31G X 5 MM MISC Inject 15 Units into the skin 2 (two) times daily. 1000 each 2   levothyroxine (SYNTHROID) 75 MCG tablet Take 75 mcg by mouth daily before breakfast.     metFORMIN (GLUCOPHAGE) 500 MG tablet Take 1 tablet (500 mg total) by mouth 2 (two) times daily with a meal. 60 tablet 2   omeprazole (PRILOSEC) 20 MG capsule Take  20 mg by mouth daily as needed (indigestion).     promethazine (PHENERGAN) 25 MG tablet Take 1 tablet (25 mg total) by mouth every 6 (six) hours as needed for nausea or vomiting. 14 tablet 0   sacubitril-valsartan (ENTRESTO) 24-26 MG Take 1 tablet by mouth 2 (two) times daily. 180 tablet 3   spironolactone (ALDACTONE) 25 MG tablet Take 1 tablet (25 mg total) by mouth daily. 90 tablet 3   No current facility-administered medications for this encounter.    Allergies  Allergen Reactions   Codeine Other (See Comments)    "Hallucinations. Narcotics cause vomiting."   Sulfa Antibiotics Other (See Comments)    "lifelong allergy"      Social History   Socioeconomic History   Marital status: Divorced    Spouse name: Not on file   Number of children: 1   Years of education: Not on file   Highest education level: Associate degree:  occupational, Scientist, product/process development, or vocational program  Occupational History   Not on file  Tobacco Use   Smoking status: Former    Packs/day: 1.00    Years: 16.00    Additional pack years: 0.00    Total pack years: 16.00    Types: Cigarettes    Start date: 09/26/1986    Quit date: 09/26/2002    Years since quitting: 20.4   Smokeless tobacco: Never   Tobacco comments:    Age [age]  Vaping Use   Vaping Use: Never used  Substance and Sexual Activity   Alcohol use: Not Currently    Comment: 4/year   Drug use: No   Sexual activity: Not Currently    Birth control/protection: Implant  Other Topics Concern   Not on file  Social History Narrative   Not on file   Social Determinants of Health   Financial Resource Strain: High Risk (11/16/2022)   Overall Financial Resource Strain (CARDIA)    Difficulty of Paying Living Expenses: Hard  Food Insecurity: No Food Insecurity (11/17/2022)   Hunger Vital Sign    Worried About Running Out of Food in the Last Year: Never true    Ran Out of Food in the Last Year: Never true  Recent Concern: Food Insecurity - Food Insecurity Present (11/16/2022)   Hunger Vital Sign    Worried About Running Out of Food in the Last Year: Sometimes true    Ran Out of Food in the Last Year: Sometimes true  Transportation Needs: No Transportation Needs (11/16/2022)   PRAPARE - Administrator, Civil Service (Medical): No    Lack of Transportation (Non-Medical): No  Recent Concern: Transportation Needs - Unmet Transportation Needs (11/14/2022)   PRAPARE - Administrator, Civil Service (Medical): Yes    Lack of Transportation (Non-Medical): No  Physical Activity: Not on file  Stress: Not on file  Social Connections: Not on file  Intimate Partner Violence: Not At Risk (11/14/2022)   Humiliation, Afraid, Rape, and Kick questionnaire    Fear of Current or Ex-Partner: No    Emotionally Abused: No    Physically Abused: No    Sexually Abused: No       Family History  Adopted: Yes    Vitals:   03/14/23 1016  BP: 110/70  Pulse: 69  SpO2: 96%  Weight: 135 kg (297 lb 9.6 oz)      PHYSICAL EXAM: General:  Obese walks with walker. No resp difficulty HEENT: normal Neck: supple. no JVD. Carotids 2+ bilat; no bruits.  No lymphadenopathy or thryomegaly appreciated. Cor: PMI nondisplaced. Regular rate & rhythm. No rubs, gallops or murmurs. Lungs: clear Abdomen: obese soft, nontender, nondistended. No hepatosplenomegaly. No bruits or masses. Good bowel sounds. Extremities: no cyanosis, clubbing, rash, edema Neuro: alert & orientedx3, cranial nerves grossly intact. moves all 4 extremities w/o difficulty. Affect pleasant  ASSESSMENT & PLAN:  1. Chronic Systolic Heart Failure  - Echo 2/24 w/ newly reduced EF 30-35%, RV moderately reduced.  - Suspect tachy-mediated.  - Echo today 6/18 EF  60-65% Personally reviewed  (complete recovery with restoration of sinus rhythm) - NYHA II - continue Lasix 20 mg daily   - continue Coreg 12.5 mg bid  - continue Entresto 24-26 mg twice a day. BP too soft for titration  - continue spironolactone to 25 mg daily  - continue jardiance 10 mg daily    2. PAF  - Had TEE/Cardioversion 10/2022 --> NSR. Back in Afib w/ RVR 3/24, spontaneously converted back to NSR - now on amiodarone, 400 mg bid. EKG today shows NSR. Denies breakthrough afib symptoms. Awaiting result of 7 day Zio to assess burden   - further titrate amiodarone down to 200 mg bid x 2 wks>>200 mg daily  (check TSH and HFTs next visit)   - long term therapy w/ amiodarone not ideal given young age. Will refer to Afib Clinic/EP to consider ablation versus switching amio to flecainide given recovery of EF  - -Zio 5/24: 21 runs of brief SVT. No AF  3. H/o SVT - denies recent symptoms - now on amiodarone  - Zio 5/24: 21 runs of brief SVT. No AF   4. PFO  - Small PFA noted on TEE   5. DMII  -Hgb A1C 9.3  -on insulin, Metformin +  Jardiance -followed by PCP    6. OSA - Untreated sleep apnea. Unable to afford CPAP.  - this will make control of Afib more difficult - wt loss would be beneficial   7. Obesity (Body mass index is 54.43 kg/m.) - has T2DM  - refer to pharmacy for GLP-1. We discussed and she is agreeable to this   Arvilla Meres, MD 03/14/23

## 2023-03-14 NOTE — Patient Instructions (Signed)
No changes to medications.  You have been referred to the Electrophysiologist. They will call you to arrange your appointment.  Your physician recommends that you schedule a follow-up appointment in: 4 months ( October) ** please call the office in August to arrange your follow up appointment. ** If you have any questions or concerns before your next appointment please send [place] a message through [place] or call our office at [phone].    TO LEAVE A MESSAGE FOR THE NURSE SELECT OPTION 2, PLEASE LEAVE A MESSAGE INCLUDING: YOUR NAME DATE OF BIRTH CALL BACK NUMBER REASON FOR CALL**this is important as we prioritize the call backs  YOU WILL RECEIVE A CALL BACK THE SAME DAY AS LONG AS YOU CALL BEFORE 4:00 PM  At the [facility], you and your health needs are our priority. As part of our continuing mission to provide you with exceptional heart care, we have created designated Provider Care Teams. These Care Teams include your primary Cardiologist (physician) and Advanced Practice Providers (APPs- Physician Assistants and Nurse Practitioners) who all work together to provide you with the care you need, when you need it.   You may see any of the following providers on your designated Care Team at your next follow up: Dr Arvilla Meres Dr Marca Ancona Dr. Marcos Eke, NP Robbie Lis, [place] [facility] [place], [place] Brynda Peon, NP Karle Plumber, PharmD   Please be sure to bring in all your medications bottles to every appointment.    Thank you for choosing [facility]

## 2023-04-05 ENCOUNTER — Ambulatory Visit (HOSPITAL_COMMUNITY)
Admission: RE | Admit: 2023-04-05 | Discharge: 2023-04-05 | Disposition: A | Discharge status: Home / Self Care | Payer: BLUE CROSS/BLUE SHIELD | Source: Ambulatory Visit | Attending: Internal Medicine | Admitting: Internal Medicine

## 2023-04-05 VITALS — BP 140/92 | HR 65 | Ht 62.0 in | Wt 294.0 lb

## 2023-04-05 DIAGNOSIS — E119 Type 2 diabetes mellitus without complications: Secondary | ICD-10-CM | POA: Diagnosis not present

## 2023-04-05 DIAGNOSIS — G4733 Obstructive sleep apnea (adult) (pediatric): Secondary | ICD-10-CM | POA: Insufficient documentation

## 2023-04-05 DIAGNOSIS — Z7984 Long term (current) use of oral hypoglycemic drugs: Secondary | ICD-10-CM | POA: Diagnosis not present

## 2023-04-05 DIAGNOSIS — I110 Hypertensive heart disease with heart failure: Secondary | ICD-10-CM | POA: Insufficient documentation

## 2023-04-05 DIAGNOSIS — Z6841 Body Mass Index (BMI) 40.0 and over, adult: Secondary | ICD-10-CM | POA: Diagnosis not present

## 2023-04-05 DIAGNOSIS — I5022 Chronic systolic (congestive) heart failure: Secondary | ICD-10-CM | POA: Insufficient documentation

## 2023-04-05 DIAGNOSIS — E669 Obesity, unspecified: Secondary | ICD-10-CM | POA: Insufficient documentation

## 2023-04-05 DIAGNOSIS — Z7901 Long term (current) use of anticoagulants: Secondary | ICD-10-CM | POA: Insufficient documentation

## 2023-04-05 DIAGNOSIS — Z79899 Other long term (current) drug therapy: Secondary | ICD-10-CM | POA: Diagnosis not present

## 2023-04-05 DIAGNOSIS — Z794 Long term (current) use of insulin: Secondary | ICD-10-CM | POA: Diagnosis not present

## 2023-04-05 DIAGNOSIS — D6869 Other thrombophilia: Secondary | ICD-10-CM | POA: Diagnosis not present

## 2023-04-05 DIAGNOSIS — I4892 Unspecified atrial flutter: Secondary | ICD-10-CM

## 2023-04-05 DIAGNOSIS — I480 Paroxysmal atrial fibrillation: Secondary | ICD-10-CM | POA: Insufficient documentation

## 2023-04-05 NOTE — Progress Notes (Signed)
Primary Care Physician: Patient, No Pcp Per Primary Cardiologist: Charlton Haws, MD Electrophysiologist: None     Referring Physician: Dr. Tressie Ellis is a 52 y.o. female with a history of OSA, SVT, atrial flutter, T2DM, obesity, chronic HFrEF, atrial fibrillation who presents for consultation in the [facility]. Seen by Dr. Gala Romney on 03/14/23 and noted to have improved EF; noted to be on amiodarone and recommended transition to different AAD due to age. Cardiac monitor 3/26-12/26/22 showed no Afib. Patient is on Eliquis for a CHADS2VASC score of 3.  On evaluation today, she is currently in NSR. She is currently taking amiodarone 200 mg daily. Review of labs show abnormal TSH 2 months ago; patient will be seeing endocrinology for T2DM and thyroid conditions. She is scheduled with pulmonary to obtain repeat sleep study; she is sure she has sleep apnea which is currently untreated. She had a sleep study years ago but treatment was cost-prohibitive. No missed doses of Eliquis.   Today, she denies symptoms of palpitations, chest pain, shortness of breath, orthopnea, PND, lower extremity edema, dizziness, presyncope, syncope, snoring, daytime somnolence, bleeding, or neurologic sequela. The patient is tolerating medications without difficulties and is otherwise without complaint today.    Atrial Fibrillation Risk Factors:  she does have symptoms or diagnosis of sleep apnea.   she has a BMI of Body mass index is 53.77 kg/m.Marland Kitchen Filed Weights   04/05/23 0920  Weight: 133.4 kg    Current Outpatient Medications  Medication Sig Dispense Refill   albuterol (VENTOLIN HFA) 108 (90 Base) MCG/ACT inhaler Inhale 2 puffs into the lungs every 6 (six) hours as needed for wheezing or shortness of breath. 18 g 2   amiodarone (PACERONE) 200 MG tablet Take 1 tablet (200 mg total) by mouth daily. 90 tablet 3   apixaban (ELIQUIS) 5 MG TABS tablet Take 1 tablet (5 mg  total) by mouth 2 (two) times daily. 180 tablet 3   Blood Glucose Monitoring Suppl DEVI 1 each by Does not apply route in the morning, at noon, and at bedtime. May substitute to any manufacturer covered by patient's insurance. 1 each 0   carvedilol (COREG) 12.5 MG tablet Take 1 tablet (12.5 mg total) by mouth 2 (two) times daily with a meal. 180 tablet 3   DULoxetine (CYMBALTA) 60 MG capsule Take 60 mg by mouth daily.     empagliflozin (JARDIANCE) 25 MG TABS tablet Take 25 mg by mouth daily.     furosemide (LASIX) 20 MG tablet TAKE 1 TABLET AS NEEDED FOR EDEMA OR FLUID (WEIGHT GAIN OF 2 LBS IN A DAY OR 5 LBS IN A WEEK). 90 tablet 3   insulin aspart (NOVOLOG) 100 UNIT/ML FlexPen CBG 70 - 120: 0 units  CBG 121 - 150: 1 unit  CBG 151 - 200: 2 units  CBG 201 - 250: 3 units  CBG 251 - 300: 5 units  CBG 301 - 350: 7 units  CBG 351 - 400: 9 units 15 mL 11   insulin isophane & regular human KwikPen (NOVOLIN 70/30 KWIKPEN) (70-30) 100 UNIT/ML KwikPen Inject 15 Units into the skin 2 (two) times daily. 15 mL 11   Insulin Pen Needle (PEN NEEDLES 3/16") 31G X 5 MM MISC Inject 15 Units into the skin 2 (two) times daily. 1000 each 2   levothyroxine (SYNTHROID) 75 MCG tablet Take 75 mcg by mouth daily before breakfast.     metFORMIN (GLUCOPHAGE) 500  MG tablet Take 1 tablet (500 mg total) by mouth 2 (two) times daily with a meal. 60 tablet 2   metFORMIN (GLUCOPHAGE) 500 MG tablet Take 500 mg by mouth 2 (two) times daily with a meal.     omeprazole (PRILOSEC) 20 MG capsule Take 20 mg by mouth daily as needed (indigestion).     sacubitril-valsartan (ENTRESTO) 24-26 MG Take 1 tablet by mouth 2 (two) times daily. 180 tablet 3   spironolactone (ALDACTONE) 25 MG tablet Take 1 tablet (25 mg total) by mouth daily. 90 tablet 3   No current facility-administered medications for this encounter.    Atrial Fibrillation Management history:  Previous antiarrhythmic drugs: amiodarone Previous cardioversions:  11/18/2022 Previous ablations: None Anticoagulation history: Eliquis   ROS- All systems are reviewed and negative except as per the HPI above.  Physical Exam: BP (!) 140/92   Pulse 65   Ht 5\' 2"  (1.575 m)   Wt 133.4 kg   BMI 53.77 kg/m   GEN: Well nourished, well developed in no acute distress NECK: No JVD; No carotid bruits CARDIAC: Regular rate and rhythm, no murmurs, rubs, gallops RESPIRATORY:  Clear to auscultation without rales, wheezing or rhonchi  ABDOMEN: Soft, non-tender, non-distended EXTREMITIES:  No edema; No deformity   EKG today demonstrates  Vent. rate 65 BPM PR interval 130 ms QRS duration 68 ms QT/QTcB 408/424 ms P-R-T axes 48 48 12 Normal sinus rhythm Normal ECG When compared with ECG of 27-Dec-2022 09:00, PREVIOUS ECG IS PRESENT  Echo 03/14/23 demonstrated   1. Left ventricular ejection fraction, by estimation, is 60 to 65%. The  left ventricle has normal function. The left ventricle has no regional  wall motion abnormalities. Left ventricular diastolic parameters were  normal.   2. Right ventricular systolic function is normal. The right ventricular  size is normal.   3. The mitral valve is normal in structure. Trivial mitral valve  regurgitation. No evidence of mitral stenosis.   4. The aortic valve is normal in structure. Aortic valve regurgitation is  not visualized. No aortic stenosis is present.   5. The inferior vena cava is normal in size with greater than 50%  respiratory variability, suggesting right atrial pressure of 3 mmHg.   ASSESSMENT & PLAN CHA2DS2-VASc Score = 3  The patient's score is based upon: CHF History: 0 HTN History: 1 Diabetes History: 1 Stroke History: 0 Vascular Disease History: 0 Age Score: 0 Gender Score: 1       ASSESSMENT AND PLAN: Paroxysmal Atrial Flutter(ICD10:  I48.0) The patient's CHA2DS2-VASc score is 3, indicating a 3.2% annual risk of stroke.    She is in NSR today. We discussed AAD options  including flecainide and Tikosyn. Her EF has normalized and it appears low EF likely secondary to arrhythmia so flecainide can still be considered. She is not a candidate for ablation until she gets to BMI 40. She requires a 3 month washout period due to half life of amiodarone. She will stop amiodarone so last dose is 04/04/23. She will call with decision on medication after speaks with insurance on pricing.   Secondary Hypercoagulable State (ICD10:  D68.69) The patient is at significant risk for stroke/thromboembolism based upon her CHA2DS2-VASc Score of 3.  Continue Apixaban (Eliquis).  No missed doses.  HFrEF Appears euvolemic today.   Follow up will be determined after patient calls with medication decision.   Lake Bells, PA-C  [facility] [address] [place], [place] [address] [phone]

## 2023-04-14 ENCOUNTER — Ambulatory Visit: Payer: BLUE CROSS/BLUE SHIELD | Admitting: Cardiology

## 2023-04-20 ENCOUNTER — Institutional Professional Consult (permissible substitution): Payer: BLUE CROSS/BLUE SHIELD | Admitting: Cardiovascular Disease

## 2023-10-04 ENCOUNTER — Emergency Department (HOSPITAL_COMMUNITY): Payer: BC Managed Care – PPO

## 2023-10-04 ENCOUNTER — Emergency Department (HOSPITAL_COMMUNITY)
Admission: EM | Admit: 2023-10-04 | Discharge: 2023-10-05 | Discharge status: Against Medical Advice | Payer: BC Managed Care – PPO | Attending: Emergency Medicine | Admitting: Emergency Medicine

## 2023-10-04 ENCOUNTER — Other Ambulatory Visit: Payer: Self-pay

## 2023-10-04 DIAGNOSIS — Z5321 Procedure and treatment not carried out due to patient leaving prior to being seen by health care provider: Secondary | ICD-10-CM | POA: Diagnosis not present

## 2023-10-04 DIAGNOSIS — R0602 Shortness of breath: Secondary | ICD-10-CM | POA: Diagnosis present

## 2023-10-04 LAB — BASIC METABOLIC PANEL
Anion gap: 10 (ref 5–15)
BUN: 24 mg/dL — ABNORMAL HIGH (ref 6–20)
CO2: 23 mmol/L (ref 22–32)
Calcium: 9.3 mg/dL (ref 8.9–10.3)
Chloride: 106 mmol/L (ref 98–111)
Creatinine, Ser: 0.7 mg/dL (ref 0.44–1.00)
GFR, Estimated: >60 mL/min (ref >60)
Glucose, Bld: 144 mg/dL — ABNORMAL HIGH (ref 70–99)
Potassium: 4.2 mmol/L (ref 3.5–5.1)
Sodium: 139 mmol/L (ref 135–145)

## 2023-10-04 LAB — TROPONIN I (HIGH SENSITIVITY): Troponin I (High Sensitivity): 2 ng/L (ref <18)

## 2023-10-04 LAB — CBC
HCT: 42.7 % (ref 36.0–46.0)
Hemoglobin: 13.8 g/dL (ref 12.0–15.0)
MCH: 29.1 pg (ref 26.0–34.0)
MCHC: 32.3 g/dL (ref 30.0–36.0)
MCV: 89.9 fL (ref 80.0–100.0)
Platelets: 291 10*3/uL (ref 150–400)
RBC: 4.75 MIL/uL (ref 3.87–5.11)
RDW: 14.3 % (ref 11.5–15.5)
WBC: 12.9 10*3/uL — ABNORMAL HIGH (ref 4.0–10.5)
nRBC: 0 % (ref 0.0–0.2)

## 2023-10-04 MED ORDER — ONDANSETRON 4 MG PO TBDP
4.0000 mg | ORAL_TABLET | Freq: Once | ORAL | Status: AC
  Administered 2023-10-04: 4 mg via ORAL
  Filled 2023-10-04: qty 1

## 2023-10-04 NOTE — ED Notes (Signed)
 Pt transported to xray

## 2023-10-04 NOTE — ED Triage Notes (Signed)
 Patient c/o SOBx1 day. Patient report she felt her HR is irregular. Patient denies N/V. Pt hx of A flutter.

## 2023-10-05 LAB — TROPONIN I (HIGH SENSITIVITY): Troponin I (High Sensitivity): 2 ng/L (ref <18)

## 2023-10-05 NOTE — ED Notes (Signed)
 Pt left the building and stated that it is due to the wait.

## 2023-11-24 ENCOUNTER — Emergency Department (HOSPITAL_COMMUNITY): Payer: BC Managed Care – PPO

## 2023-11-24 ENCOUNTER — Emergency Department (HOSPITAL_COMMUNITY)
Admission: EM | Admit: 2023-11-24 | Discharge: 2023-11-24 | Disposition: A | Discharge status: Home / Self Care | Payer: BC Managed Care – PPO | Attending: Emergency Medicine | Admitting: Emergency Medicine

## 2023-11-24 ENCOUNTER — Encounter (HOSPITAL_COMMUNITY): Payer: Self-pay

## 2023-11-24 ENCOUNTER — Other Ambulatory Visit: Payer: Self-pay

## 2023-11-24 DIAGNOSIS — R0602 Shortness of breath: Secondary | ICD-10-CM | POA: Insufficient documentation

## 2023-11-24 DIAGNOSIS — E119 Type 2 diabetes mellitus without complications: Secondary | ICD-10-CM | POA: Insufficient documentation

## 2023-11-24 DIAGNOSIS — Z7901 Long term (current) use of anticoagulants: Secondary | ICD-10-CM | POA: Diagnosis not present

## 2023-11-24 DIAGNOSIS — R059 Cough, unspecified: Secondary | ICD-10-CM | POA: Diagnosis present

## 2023-11-24 DIAGNOSIS — J069 Acute upper respiratory infection, unspecified: Secondary | ICD-10-CM | POA: Insufficient documentation

## 2023-11-24 DIAGNOSIS — Z794 Long term (current) use of insulin: Secondary | ICD-10-CM | POA: Insufficient documentation

## 2023-11-24 DIAGNOSIS — I509 Heart failure, unspecified: Secondary | ICD-10-CM | POA: Diagnosis not present

## 2023-11-24 DIAGNOSIS — Z7984 Long term (current) use of oral hypoglycemic drugs: Secondary | ICD-10-CM | POA: Insufficient documentation

## 2023-11-24 LAB — CBC WITH DIFFERENTIAL/PLATELET
Abs Immature Granulocytes: 0.06 10*3/uL (ref 0.00–0.07)
Basophils Absolute: 0 10*3/uL (ref 0.0–0.1)
Basophils Relative: 0 %
Eosinophils Absolute: 0 10*3/uL (ref 0.0–0.5)
Eosinophils Relative: 0 %
HCT: 44.2 % (ref 36.0–46.0)
Hemoglobin: 13.8 g/dL (ref 12.0–15.0)
Immature Granulocytes: 0 %
Lymphocytes Relative: 12 %
Lymphs Abs: 1.6 10*3/uL (ref 0.7–4.0)
MCH: 28.3 pg (ref 26.0–34.0)
MCHC: 31.2 g/dL (ref 30.0–36.0)
MCV: 90.6 fL (ref 80.0–100.0)
Monocytes Absolute: 0.6 10*3/uL (ref 0.1–1.0)
Monocytes Relative: 4 %
Neutro Abs: 11.4 10*3/uL — ABNORMAL HIGH (ref 1.7–7.7)
Neutrophils Relative %: 84 %
Platelets: 269 10*3/uL (ref 150–400)
RBC: 4.88 MIL/uL (ref 3.87–5.11)
RDW: 14.6 % (ref 11.5–15.5)
WBC: 13.7 10*3/uL — ABNORMAL HIGH (ref 4.0–10.5)
nRBC: 0 % (ref 0.0–0.2)

## 2023-11-24 LAB — RESP PANEL BY RT-PCR (RSV, FLU A&B, COVID)  RVPGX2
Influenza A by PCR: NEGATIVE
Influenza B by PCR: NEGATIVE
Resp Syncytial Virus by PCR: NEGATIVE
SARS Coronavirus 2 by RT PCR: NEGATIVE

## 2023-11-24 LAB — COMPREHENSIVE METABOLIC PANEL
ALT: 19 U/L (ref 0–44)
AST: 19 U/L (ref 15–41)
Albumin: 3.8 g/dL (ref 3.5–5.0)
Alkaline Phosphatase: 65 U/L (ref 38–126)
Anion gap: 10 (ref 5–15)
BUN: 17 mg/dL (ref 6–20)
CO2: 25 mmol/L (ref 22–32)
Calcium: 9.3 mg/dL (ref 8.9–10.3)
Chloride: 105 mmol/L (ref 98–111)
Creatinine, Ser: 0.9 mg/dL (ref 0.44–1.00)
GFR, Estimated: >60 mL/min (ref >60)
Glucose, Bld: 133 mg/dL — ABNORMAL HIGH (ref 70–99)
Potassium: 4.1 mmol/L (ref 3.5–5.1)
Sodium: 140 mmol/L (ref 135–145)
Total Bilirubin: 0.7 mg/dL (ref 0.0–1.2)
Total Protein: 7.9 g/dL (ref 6.5–8.1)

## 2023-11-24 LAB — TROPONIN I (HIGH SENSITIVITY): Troponin I (High Sensitivity): 3 ng/L (ref <18)

## 2023-11-24 LAB — BRAIN NATRIURETIC PEPTIDE: B Natriuretic Peptide: 61 pg/mL (ref 0.0–100.0)

## 2023-11-24 MED ORDER — ALBUTEROL SULFATE HFA 108 (90 BASE) MCG/ACT IN AERS: 2.0000 | INHALATION_SPRAY | RESPIRATORY_TRACT | Status: DC | PRN

## 2023-11-24 NOTE — ED Triage Notes (Signed)
 Pt. Arrives POV c/o SOB states that she has also had nasal congestion and chills. Hx of chf and diabetes. Decided to come into the ED after doing a google search of her symptoms.

## 2023-11-24 NOTE — ED Notes (Signed)
 AVS provided to and discussed with patient. Pt verbalizes understanding of discharge instructions and denies any questions or concerns at this time. Pt ambulated out of department independently with steady gait.

## 2023-11-24 NOTE — ED Provider Notes (Signed)
 [place] EMERGENCY DEPARTMENT AT [facility] Provider Note   CSN: [id number] Arrival date & time: 11/24/23  0208     History  Chief Complaint  Patient presents with   Shortness of Breath    Debbie Lee is a 53 y.o. female.  Patient presents to the emergency department for evaluation of nasal congestion, cough, chills.  She reports that she has a history of diabetes and congestive heart failure as well as a flutter and SVT.  She has been feeling like her heart has been racing at times which concerned her.  She is on Eliquis.       Home Medications Prior to Admission medications   Medication Sig Start Date End Date Taking? Authorizing Provider  albuterol (VENTOLIN HFA) 108 (90 Base) MCG/ACT inhaler Inhale 2 puffs into the lungs every 6 (six) hours as needed for wheezing or shortness of breath. 11/20/22   Kathlen Mody, MD  amiodarone (PACERONE) 200 MG tablet Take 1 tablet (200 mg total) by mouth daily. 03/14/23   [name], Bevelyn Buckles, MD  apixaban (ELIQUIS) 5 MG TABS tablet Take 1 tablet (5 mg total) by mouth 2 (two) times daily. 02/08/23   Gaston Islam., NP  Blood Glucose Monitoring Suppl DEVI 1 each by Does not apply route in the morning, at noon, and at bedtime. May substitute to any manufacturer covered by patient's insurance. 11/20/22   Kathlen Mody, MD  carvedilol (COREG) 12.5 MG tablet Take 1 tablet (12.5 mg total) by mouth 2 (two) times daily with a meal. 02/23/23   Gaston Islam., NP  DULoxetine (CYMBALTA) 60 MG capsule Take 60 mg by mouth daily. 10/22/20   [provider]  empagliflozin (JARDIANCE) 25 MG TABS tablet Take 25 mg by mouth daily.    [provider]  furosemide (LASIX) 20 MG tablet TAKE 1 TABLET AS NEEDED FOR EDEMA OR FLUID (WEIGHT GAIN OF 2 LBS IN A DAY OR 5 LBS IN A WEEK). 01/06/23   Gaston Islam., NP  insulin aspart (NOVOLOG) 100 UNIT/ML FlexPen CBG 70 - 120: 0 units  CBG 121 - 150: 1 unit  CBG 151 - 200: 2 units  CBG  201 - 250: 3 units  CBG 251 - 300: 5 units  CBG 301 - 350: 7 units  CBG 351 - 400: 9 units 11/20/22   Kathlen Mody, MD  insulin isophane & regular human KwikPen (NOVOLIN 70/30 KWIKPEN) (70-30) 100 UNIT/ML KwikPen Inject 15 Units into the skin 2 (two) times daily. 11/20/22   Kathlen Mody, MD  Insulin Pen Needle (PEN NEEDLES 3/16") 31G X 5 MM MISC Inject 15 Units into the skin 2 (two) times daily. 12/15/22   Russella Dar, NP  levothyroxine (SYNTHROID) 75 MCG tablet Take 75 mcg by mouth daily before breakfast.    [provider]  metFORMIN (GLUCOPHAGE) 500 MG tablet Take 1 tablet (500 mg total) by mouth 2 (two) times daily with a meal. 11/20/22   Kathlen Mody, MD  metFORMIN (GLUCOPHAGE) 500 MG tablet Take 500 mg by mouth 2 (two) times daily with a meal.    [provider]  omeprazole (PRILOSEC) 20 MG capsule Take 20 mg by mouth daily as needed (indigestion).    [provider]  sacubitril-valsartan (ENTRESTO) 24-26 MG Take 1 tablet by mouth 2 (two) times daily. 02/22/23   Gaston Islam., NP  spironolactone (ALDACTONE) 25 MG tablet Take 1 tablet (25 mg total) by mouth daily. 12/27/22  Robbie Lis M, PA-C      Allergies    Codeine and Sulfa antibiotics    Review of Systems   Review of Systems  Physical Exam Updated Vital Signs BP 112/66   Pulse 78   Temp 98 F (36.7 C) (Oral)   Resp 18   SpO2 98%  Physical Exam Vitals and nursing note reviewed.  Constitutional:      General: She is not in acute distress.    Appearance: She is well-developed.  HENT:     Head: Normocephalic and atraumatic.     Mouth/Throat:     Mouth: Mucous membranes are moist.  Eyes:     General: Vision grossly intact. Gaze aligned appropriately.     Extraocular Movements: Extraocular movements intact.     Conjunctiva/sclera: Conjunctivae normal.  Cardiovascular:     Rate and Rhythm: Normal rate and regular rhythm.     Pulses: Normal pulses.     Heart sounds: Normal heart  sounds, S1 normal and S2 normal. No murmur heard.    No friction rub. No gallop.  Pulmonary:     Effort: Pulmonary effort is normal. No respiratory distress.     Breath sounds: Normal breath sounds.  Abdominal:     General: Bowel sounds are normal.     Palpations: Abdomen is soft.     Tenderness: There is no abdominal tenderness. There is no guarding or rebound.     Hernia: No hernia is present.  Musculoskeletal:        General: No swelling.     Cervical back: Full passive range of motion without pain, normal range of motion and neck supple. No spinous process tenderness or muscular tenderness. Normal range of motion.     Right lower leg: No edema.     Left lower leg: No edema.  Skin:    General: Skin is warm and dry.     Capillary Refill: Capillary refill takes less than 2 seconds.     Findings: No ecchymosis, erythema, rash or wound.  Neurological:     General: No focal deficit present.     Mental Status: She is alert and oriented to person, place, and time.     GCS: GCS eye subscore is 4. GCS verbal subscore is 5. GCS motor subscore is 6.     Cranial Nerves: Cranial nerves 2-12 are intact.     Sensory: Sensation is intact.     Motor: Motor function is intact.     Coordination: Coordination is intact.  Psychiatric:        Attention and Perception: Attention normal.        Mood and Affect: Mood normal.        Speech: Speech normal.        Behavior: Behavior normal.     ED Results / Procedures / Treatments   Labs (all labs ordered are listed, but only abnormal results are displayed) Labs Reviewed  CBC WITH DIFFERENTIAL/PLATELET - Abnormal; Notable for the following components:      Result Value   WBC 13.7 (*)    Neutro Abs 11.4 (*)    All other components within normal limits  COMPREHENSIVE METABOLIC PANEL - Abnormal; Notable for the following components:   Glucose, Bld 133 (*)    All other components within normal limits  RESP PANEL BY RT-PCR (RSV, FLU A&B, COVID)   [id number]  BRAIN NATRIURETIC PEPTIDE  TROPONIN I (HIGH SENSITIVITY)  TROPONIN I (HIGH SENSITIVITY)    EKG EKG Interpretation  Date/Time:  Friday November 24 2023 02:25:39 EST Ventricular Rate:  74 PR Interval:  131 QRS Duration:  77 QT Interval:  357 QTC Calculation: 396 R Axis:   46  Text Interpretation: Sinus rhythm Normal ECG Confirmed by Gilda Crease [phone]) on 11/24/2023 3:07:22 AM  Radiology DG Chest 2 View Result Date: 11/24/2023 CLINICAL DATA:  Shortness of breath. Nasal congestion, chills and CHF. EXAM: CHEST - 2 VIEW COMPARISON:  10/05/23 FINDINGS: The heart size and mediastinal contours are within normal limits. Both lungs are clear. The visualized skeletal structures are unremarkable. IMPRESSION: No active cardiopulmonary disease. Electronically Signed   By: Signa Kell M.D.   On: 11/24/2023 05:05    Procedures Procedures    Medications Ordered in ED Medications  albuterol (VENTOLIN HFA) 108 (90 Base) MCG/ACT inhaler 2 puff (has no administration in time range)    ED Course/ Medical Decision Making/ A&P                                 Medical Decision Making Amount and/or Complexity of Data Reviewed Labs: ordered. Radiology: ordered.  Risk Prescription drug management.   Differential Diagnosis considered includes, but not limited to: COVID-19; influenza; RSV; simple viral URI; pneumonia  COVID and influenza swabs negative.  Chest x-ray without evidence of pneumonia or heart failure.  Workup has been reassuring.  Normal troponin, normal BNP.  Remainder of labs unremarkable.  Patient reassured, treat for viral URI symptomatically.  No arrhythmia noted here.  She is wearing a Zio patch, follow-up with cardiology.        Final Clinical Impression(s) / ED Diagnoses Final diagnoses:  Upper respiratory tract infection, unspecified type    Rx / DC Orders ED Discharge Orders     None         Kinsie Belford, Canary Brim, MD 11/24/23 [phone]

## 2023-12-15 ENCOUNTER — Encounter (HOSPITAL_COMMUNITY): Payer: Self-pay

## 2023-12-15 ENCOUNTER — Emergency Department (HOSPITAL_COMMUNITY)

## 2023-12-15 ENCOUNTER — Emergency Department (HOSPITAL_COMMUNITY)
Admission: EM | Admit: 2023-12-15 | Discharge: 2023-12-15 | Disposition: A | Discharge status: Home / Self Care | Attending: Emergency Medicine | Admitting: Emergency Medicine

## 2023-12-15 DIAGNOSIS — I110 Hypertensive heart disease with heart failure: Secondary | ICD-10-CM | POA: Insufficient documentation

## 2023-12-15 DIAGNOSIS — E119 Type 2 diabetes mellitus without complications: Secondary | ICD-10-CM | POA: Diagnosis not present

## 2023-12-15 DIAGNOSIS — Z794 Long term (current) use of insulin: Secondary | ICD-10-CM | POA: Diagnosis not present

## 2023-12-15 DIAGNOSIS — Z7901 Long term (current) use of anticoagulants: Secondary | ICD-10-CM | POA: Diagnosis not present

## 2023-12-15 DIAGNOSIS — R059 Cough, unspecified: Secondary | ICD-10-CM | POA: Insufficient documentation

## 2023-12-15 DIAGNOSIS — R0602 Shortness of breath: Secondary | ICD-10-CM | POA: Diagnosis present

## 2023-12-15 DIAGNOSIS — I509 Heart failure, unspecified: Secondary | ICD-10-CM | POA: Insufficient documentation

## 2023-12-15 DIAGNOSIS — I483 Typical atrial flutter: Secondary | ICD-10-CM | POA: Insufficient documentation

## 2023-12-15 LAB — CBC WITH DIFFERENTIAL/PLATELET
Abs Immature Granulocytes: 0.05 10*3/uL (ref 0.00–0.07)
Basophils Absolute: 0 10*3/uL (ref 0.0–0.1)
Basophils Relative: 0 %
Eosinophils Absolute: 0.1 10*3/uL (ref 0.0–0.5)
Eosinophils Relative: 1 %
HCT: 47 % — ABNORMAL HIGH (ref 36.0–46.0)
Hemoglobin: 14.5 g/dL (ref 12.0–15.0)
Immature Granulocytes: 0 %
Lymphocytes Relative: 17 %
Lymphs Abs: 2.2 10*3/uL (ref 0.7–4.0)
MCH: 27.8 pg (ref 26.0–34.0)
MCHC: 30.9 g/dL (ref 30.0–36.0)
MCV: 90.2 fL (ref 80.0–100.0)
Monocytes Absolute: 0.7 10*3/uL (ref 0.1–1.0)
Monocytes Relative: 6 %
Neutro Abs: 9.5 10*3/uL — ABNORMAL HIGH (ref 1.7–7.7)
Neutrophils Relative %: 76 %
Platelets: 304 10*3/uL (ref 150–400)
RBC: 5.21 MIL/uL — ABNORMAL HIGH (ref 3.87–5.11)
RDW: 14.7 % (ref 11.5–15.5)
WBC: 12.5 10*3/uL — ABNORMAL HIGH (ref 4.0–10.5)
nRBC: 0 % (ref 0.0–0.2)

## 2023-12-15 LAB — RESP PANEL BY RT-PCR (RSV, FLU A&B, COVID)  RVPGX2
Influenza A by PCR: NEGATIVE
Influenza B by PCR: NEGATIVE
Resp Syncytial Virus by PCR: NEGATIVE
SARS Coronavirus 2 by RT PCR: NEGATIVE

## 2023-12-15 LAB — HEPATIC FUNCTION PANEL
ALT: 20 U/L (ref 0–44)
AST: 16 U/L (ref 15–41)
Albumin: 3.5 g/dL (ref 3.5–5.0)
Alkaline Phosphatase: 54 U/L (ref 38–126)
Bilirubin, Direct: 0.1 mg/dL (ref 0.0–0.2)
Indirect Bilirubin: 1 mg/dL — ABNORMAL HIGH (ref 0.3–0.9)
Total Bilirubin: 1.1 mg/dL (ref 0.0–1.2)
Total Protein: 7.5 g/dL (ref 6.5–8.1)

## 2023-12-15 LAB — BASIC METABOLIC PANEL
Anion gap: 10 (ref 5–15)
BUN: 16 mg/dL (ref 6–20)
CO2: 20 mmol/L — ABNORMAL LOW (ref 22–32)
Calcium: 8.9 mg/dL (ref 8.9–10.3)
Chloride: 107 mmol/L (ref 98–111)
Creatinine, Ser: 0.97 mg/dL (ref 0.44–1.00)
GFR, Estimated: >60 mL/min (ref >60)
Glucose, Bld: 140 mg/dL — ABNORMAL HIGH (ref 70–99)
Potassium: 3.9 mmol/L (ref 3.5–5.1)
Sodium: 137 mmol/L (ref 135–145)

## 2023-12-15 LAB — MAGNESIUM: Magnesium: 2 mg/dL (ref 1.7–2.4)

## 2023-12-15 LAB — HCG, SERUM, QUALITATIVE: Preg, Serum: NEGATIVE

## 2023-12-15 LAB — BRAIN NATRIURETIC PEPTIDE: B Natriuretic Peptide: 204 pg/mL — ABNORMAL HIGH (ref 0.0–100.0)

## 2023-12-15 MED ORDER — APIXABAN 5 MG PO TABS
5.0000 mg | ORAL_TABLET | Freq: Once | ORAL | Status: AC
  Administered 2023-12-15: 5 mg via ORAL
  Filled 2023-12-15: qty 1

## 2023-12-15 MED ORDER — PROPOFOL 10 MG/ML IV BOLUS
0.5000 mg/kg | Freq: Once | INTRAVENOUS | Status: AC
  Administered 2023-12-15: 64.7 mg via INTRAVENOUS
  Filled 2023-12-15: qty 20

## 2023-12-15 MED ORDER — METOPROLOL TARTRATE 5 MG/5ML IV SOLN
5.0000 mg | Freq: Once | INTRAVENOUS | Status: AC
  Administered 2023-12-15: 5 mg via INTRAVENOUS
  Filled 2023-12-15: qty 5

## 2023-12-15 NOTE — ED Notes (Signed)
 Pt consented for cardioversion. Time out at 0951. Procedure began [phone] with administration of 5 ml of propofol. Vitals taken. Pt converted to NSR. Procedure end 0955. Pt returned to baseline LOC.

## 2023-12-15 NOTE — Progress Notes (Signed)
   12/15/23 0935  Spiritual Encounters  Type of Visit Attempt (pt unavailable)   Per concern regarding earlier MEWS score, I sought to check in on patient to offer support. Spoke with RN to let staff know of my availability and she explained cardioversion had been done.  May follow-up later or please page if you think patient may like some supportive presence.  Khylei Wilms L. Sophronia Simas, M.Div [phone]

## 2023-12-15 NOTE — Progress Notes (Signed)
 Respiratory Therapist at [facility] in room number __WA03__ during procedure.  Suction with [place] at [facility] set up and ready to use. Ambu bag at [facility] and ready to use.  Patient placed on ETCO2 Nasal Cannula at __2__ LPM.  Vitals at conclusion of procedure:  ETCO2 __35__ mmHg HR  66 RR  23 SPO2 98%  Patient awake and able to verbalize name.

## 2023-12-15 NOTE — ED Provider Notes (Signed)
 [place] [facility] DEPARTMENT AT [facility] Provider Note   CSN: [id number] Arrival date & time: 12/15/23  0865     History  Chief Complaint  Patient presents with   Chest Pain    Debbie Lee is a 53 y.o. female.  Pt is a 52y/o female with hx of afib on eliquis, HTN, DM, CHF with EF of 60-65% who is presenting with a few weeks now pt reports having diarrhea at least 1 time a day, general abdominal upset, occasional cough and a little bit of congestion.  She denies any fevers, lower extremity edema but does notice occasional shortness of breath with ambulating.  She reports over the last week she has had times where her heart rate has been elevated but has gone back to normal.  However starting yesterday she noticed her heart rate was very high however it did improve she went to work but since 8 PM last night after she ate dinner her heart rate has been elevated.  Based on her Apple Watch it was elevated all night long.  She reports she is feeling generally fatigued but denies any significant chest pain.  She has been compliant with her medications and there have been no recent change in her medications.  She is takes Entresto, spironolactone, Lasix, carvedilol.  She is also on Eliquis and has not missed any doses in the last month.  She has not had her medications this morning.  The history is provided by the patient.  Chest Pain      Home Medications Prior to Admission medications   Medication Sig Start Date End Date Taking? Authorizing Provider  albuterol (VENTOLIN HFA) 108 (90 Base) MCG/ACT inhaler Inhale 2 puffs into the lungs every 6 (six) hours as needed for wheezing or shortness of breath. 11/20/22   Kathlen Mody, MD  amiodarone (PACERONE) 200 MG tablet Take 1 tablet (200 mg total) by mouth daily. 03/14/23   [name], Bevelyn Buckles, MD  apixaban (ELIQUIS) 5 MG TABS tablet Take 1 tablet (5 mg total) by mouth 2 (two) times daily. 02/08/23   Gaston Islam., NP  Blood  Glucose Monitoring Suppl DEVI 1 each by Does not apply route in the morning, at noon, and at bedtime. May substitute to any manufacturer covered by patient's insurance. 11/20/22   Kathlen Mody, MD  carvedilol (COREG) 12.5 MG tablet Take 1 tablet (12.5 mg total) by mouth 2 (two) times daily with a meal. 02/23/23   Gaston Islam., NP  DULoxetine (CYMBALTA) 60 MG capsule Take 60 mg by mouth daily. 10/22/20   [provider]  empagliflozin (JARDIANCE) 25 MG TABS tablet Take 25 mg by mouth daily.    [provider]  furosemide (LASIX) 20 MG tablet TAKE 1 TABLET AS NEEDED FOR EDEMA OR FLUID (WEIGHT GAIN OF 2 LBS IN A DAY OR 5 LBS IN A WEEK). 01/06/23   Gaston Islam., NP  insulin aspart (NOVOLOG) 100 UNIT/ML FlexPen CBG 70 - 120: 0 units  CBG 121 - 150: 1 unit  CBG 151 - 200: 2 units  CBG 201 - 250: 3 units  CBG 251 - 300: 5 units  CBG 301 - 350: 7 units  CBG 351 - 400: 9 units 11/20/22   Kathlen Mody, MD  insulin isophane & regular human KwikPen (NOVOLIN 70/30 KWIKPEN) (70-30) 100 UNIT/ML KwikPen Inject 15 Units into the skin 2 (two) times daily. 11/20/22   Kathlen Mody, MD  Insulin Pen Needle (  PEN NEEDLES 3/16") 31G X 5 MM MISC Inject 15 Units into the skin 2 (two) times daily. 12/15/22   Russella Dar, NP  levothyroxine (SYNTHROID) 75 MCG tablet Take 75 mcg by mouth daily before breakfast.    [provider]  metFORMIN (GLUCOPHAGE) 500 MG tablet Take 1 tablet (500 mg total) by mouth 2 (two) times daily with a meal. 11/20/22   Kathlen Mody, MD  metFORMIN (GLUCOPHAGE) 500 MG tablet Take 500 mg by mouth 2 (two) times daily with a meal.    [provider]  omeprazole (PRILOSEC) 20 MG capsule Take 20 mg by mouth daily as needed (indigestion).    [provider]  sacubitril-valsartan (ENTRESTO) 24-26 MG Take 1 tablet by mouth 2 (two) times daily. 02/22/23   Gaston Islam., NP  spironolactone (ALDACTONE) 25 MG tablet Take 1 tablet (25 mg total) by mouth  daily. 12/27/22   Allayne Butcher, PA-C      Allergies    Codeine and Sulfa antibiotics    Review of Systems   Review of Systems  Cardiovascular:  Positive for chest pain.    Physical Exam Updated Vital Signs BP 104/77   Pulse 73   Temp 98 F (36.7 C)   Resp (!) 23   Wt 129.3 kg   SpO2 98%   BMI 52.13 kg/m  Physical Exam Vitals and nursing note reviewed.  Constitutional:      General: She is not in acute distress.    Appearance: She is well-developed.  HENT:     Head: Normocephalic and atraumatic.  Eyes:     Pupils: Pupils are equal, round, and reactive to light.  Cardiovascular:     Rate and Rhythm: Regular rhythm. Tachycardia present.     Pulses: Normal pulses.     Heart sounds: Normal heart sounds. No murmur heard.    No friction rub.  Pulmonary:     Effort: Pulmonary effort is normal.     Breath sounds: Normal breath sounds. No wheezing or rales.  Abdominal:     General: Bowel sounds are normal. There is no distension.     Palpations: Abdomen is soft.     Tenderness: There is no abdominal tenderness. There is no guarding or rebound.  Musculoskeletal:        General: No tenderness. Normal range of motion.     Right lower leg: No edema.     Left lower leg: No edema.     Comments: No edema  Skin:    General: Skin is warm and dry.     Findings: No rash.  Neurological:     Mental Status: She is alert and oriented to person, place, and time.     Cranial Nerves: No cranial nerve deficit.  Psychiatric:        Behavior: Behavior normal.     ED Results / Procedures / Treatments   Labs (all labs ordered are listed, but only abnormal results are displayed) Labs Reviewed  BASIC METABOLIC PANEL - Abnormal; Notable for the following components:      Result Value   CO2 20 (*)    Glucose, Bld 140 (*)    All other components within normal limits  BRAIN NATRIURETIC PEPTIDE - Abnormal; Notable for the following components:   B Natriuretic Peptide 204.0 (*)     All other components within normal limits  HEPATIC FUNCTION PANEL - Abnormal; Notable for the following components:   Indirect Bilirubin 1.0 (*)  All other components within normal limits  CBC WITH DIFFERENTIAL/PLATELET - Abnormal; Notable for the following components:   WBC 12.5 (*)    RBC 5.21 (*)    HCT 47.0 (*)    Neutro Abs 9.5 (*)    All other components within normal limits  RESP PANEL BY RT-PCR (RSV, FLU A&B, COVID)  RVPGX2  HCG, SERUM, QUALITATIVE  MAGNESIUM    EKG EKG Interpretation Date/Time:  Friday December 15 2023 06:55:03 EDT Ventricular Rate:  148 PR Interval:    QRS Duration:  92 QT Interval:  317 QTC Calculation: 496 R Axis:   39  Text Interpretation: Atrial flutter Abnormal T, consider ischemia, diffuse leads When compared with ECG of 11/24/2023, Atrial flutter has replaced Sinus rhythm T wave abnormality NOW PRESENT  - likely rate-related Confirmed by Dione Booze ([id number]) on 12/15/2023 6:58:10 AM  Radiology DG Chest 2 View Result Date: 12/15/2023 CLINICAL DATA:  Chest pain and shortness of breath. EXAM: CHEST - 2 VIEW COMPARISON:  PA and lateral chest 11/24/2023. FINDINGS: The heart size and mediastinal contours are within normal limits. Both lungs are clear. The visualized skeletal structures are unremarkable. IMPRESSION: No active cardiopulmonary disease.  Stable chest. Electronically Signed   By: Almira Bar M.D.   On: 12/15/2023 07:40    Procedures .Sedation  Date/Time: 12/15/2023 11:12 AM  Performed by: Gwyneth Sprout, MD Authorized by: Gwyneth Sprout, MD   Consent:    Consent obtained:  Verbal   Consent given by:  Patient   Risks discussed:  Allergic reaction, dysrhythmia, inadequate sedation, nausea, prolonged hypoxia resulting in organ damage, prolonged sedation necessitating reversal, respiratory compromise necessitating ventilatory assistance and intubation and vomiting   Alternatives discussed:  Analgesia without sedation, anxiolysis and  regional anesthesia Universal protocol:    Procedure explained and questions answered to patient or proxy's satisfaction: yes     Relevant documents present and verified: yes     Test results available: yes     Imaging studies available: yes     Required blood products, implants, devices, and special equipment available: yes     Site/side marked: yes     Immediately prior to procedure, a time out was called: yes     Patient identity confirmed:  Verbally with patient Indications:    Procedure performed:  Cardioversion   Procedure necessitating sedation performed by:  Physician performing sedation Pre-sedation assessment:    Time since last food or drink:  8 hours   ASA classification: class 3 - patient with severe systemic disease     Mouth opening:  2 finger widths   Thyromental distance:  3 finger widths   Mallampati score:  III - soft palate, base of uvula visible   Neck mobility: normal     Pre-sedation assessments completed and reviewed: airway patency, cardiovascular function, hydration status, mental status, nausea/vomiting, pain level, respiratory function and temperature   A pre-sedation assessment was completed prior to the start of the procedure Immediate pre-procedure details:    Reassessment: Patient reassessed immediately prior to procedure     Reviewed: vital signs, relevant labs/tests and NPO status     Verified: bag valve mask available, emergency equipment available, intubation equipment available, IV patency confirmed, oxygen available and suction available   Procedure details (see MAR for exact dosages):    Preoxygenation:  Nasal cannula   Sedation:  Propofol   Intended level of sedation: deep   Analgesia:  None   Intra-procedure monitoring:  Blood pressure monitoring, cardiac monitor, continuous  pulse oximetry, frequent LOC assessments, frequent vital sign checks and continuous capnometry   Intra-procedure events: none     Total Provider sedation time (minutes):   10 Post-procedure details:   A post-sedation assessment was completed following the completion of the procedure.   Attendance: Constant attendance by certified staff until patient recovered     Recovery: Patient returned to pre-procedure baseline     Post-sedation assessments completed and reviewed: airway patency, cardiovascular function, hydration status, mental status, nausea/vomiting, pain level, respiratory function and temperature     Patient is stable for discharge or admission: yes     Procedure completion:  Tolerated well, no immediate complications .Cardioversion  Date/Time: 12/15/2023 11:13 AM  Performed by: Gwyneth Sprout, MD Authorized by: Gwyneth Sprout, MD   Consent:    Consent obtained:  Verbal   Consent given by:  Patient   Risks discussed:  Death, induced arrhythmia and pain   Alternatives discussed:  No treatment Pre-procedure details:    Cardioversion basis:  Elective   Rhythm:  Atrial flutter   Electrode placement:  Anterior-posterior Patient sedated: Yes. Refer to sedation procedure documentation for details of sedation.  Attempt one:    Cardioversion mode:  Synchronous   Waveform:  Biphasic   Shock (Joules):  120   Shock outcome:  Conversion to normal sinus rhythm Post-procedure details:    Patient status:  Awake   Patient tolerance of procedure:  Tolerated well, no immediate complications     Medications Ordered in ED Medications  metoprolol tartrate (LOPRESSOR) injection 5 mg (5 mg Intravenous Given 12/15/23 0736)  apixaban (ELIQUIS) tablet 5 mg (5 mg Oral Given 12/15/23 0846)  propofol (DIPRIVAN) 10 mg/mL bolus/IV push 64.7 mg (64.7 mg Intravenous Given 12/15/23 1308)    ED Course/ Medical Decision Making/ A&P                                 Medical Decision Making Amount and/or Complexity of Data Reviewed External Data Reviewed: notes. Labs: ordered. Decision-making details documented in ED Course. Radiology: ordered and independent  interpretation performed. Decision-making details documented in ED Course. ECG/medicine tests: ordered and independent interpretation performed. Decision-making details documented in ED Course.  Risk Prescription drug management.   Pt with multiple medical problems and comorbidities and presenting today with a complaint that caries a high risk for morbidity and mortality.  Here today with multiple symptoms including GI upset for the last few weeks and now with elevated heart rates and fatigue.  Concern for electrolyte abnormalities versus anemia versus viral etiologies versus AKI versus dysrhythmia.  Patient does not look fluid overloaded on exam today and reports being compliant with her medications.  Low suspicion for PE, pericarditis, tamponade, dissection or ACS.  Patient was placed on continuous cardiac monitoring which I independently interpreted as atrial flutter with a rate between 130-140. I independently interpreted patient's labs and EKG. EKG today with atrial flutter with some T wave abnormality most likely related to right.  CBC with mild leukocytosis of 12.5 but normal hemoglobin and platelet count, BNP mildly elevated at 204, CMP without acute findings, pregnancy test is negative and viral swab is negative.  Magnesium is normal.  I have independently visualized and interpreted pt's images today. Chest x-ray without acute findings.  Given all normal blood work patient would be a candidate for cardioversion.  This was discussed with her and she would like to proceed.  Patient was cardioverted after  1 shock at 120 J.  She has remained in sinus rhythm reports she feels much better.  She has a planned follow-up with a electro cardiologist at [facility] within the month.  She will continue on her current medications.  At this time no indication for further testing or admission.  Patient is stable for discharge.  CRITICAL CARE Performed by: Jenni Thew Total critical care time: 30  minutes Critical care time was exclusive of separately billable procedures and treating other patients. Critical care was necessary to treat or prevent imminent or life-threatening deterioration. Critical care was time spent personally by me on the following activities: development of treatment plan with patient and/or surrogate as well as nursing, discussions with consultants, evaluation of patient's response to treatment, examination of patient, obtaining history from patient or surrogate, ordering and performing treatments and interventions, ordering and review of laboratory studies, ordering and review of radiographic studies, pulse oximetry and re-evaluation of patient's condition.        Final Clinical Impression(s) / ED Diagnoses Final diagnoses:  Typical atrial flutter [facility])    Rx / DC Orders ED Discharge Orders          Ordered    Amb referral to AFIB [facility]        12/15/23 0700              Gwyneth Sprout, MD 12/15/23 1117

## 2023-12-15 NOTE — ED Triage Notes (Signed)
 Pt states that she has been having central CP with SOB and nausea for the past few hours

## 2023-12-15 NOTE — Discharge Instructions (Signed)
 All the blood work today looks normal.  Continue all your home medications.  You did receive a dose of carvedilol and Eliquis here this morning but you can take all of other morning medications when you get home.  Return if you start having chest pain, shortness of breath or palpitations.

## 2024-01-14 ENCOUNTER — Emergency Department (HOSPITAL_COMMUNITY)
Admission: EM | Admit: 2024-01-14 | Discharge: 2024-01-15 | Disposition: A | Discharge status: Home / Self Care | Attending: Emergency Medicine | Admitting: Emergency Medicine

## 2024-01-14 DIAGNOSIS — Z79899 Other long term (current) drug therapy: Secondary | ICD-10-CM | POA: Diagnosis not present

## 2024-01-14 DIAGNOSIS — R002 Palpitations: Secondary | ICD-10-CM | POA: Diagnosis present

## 2024-01-14 DIAGNOSIS — D72829 Elevated white blood cell count, unspecified: Secondary | ICD-10-CM | POA: Insufficient documentation

## 2024-01-14 DIAGNOSIS — I10 Essential (primary) hypertension: Secondary | ICD-10-CM | POA: Insufficient documentation

## 2024-01-14 DIAGNOSIS — Z7901 Long term (current) use of anticoagulants: Secondary | ICD-10-CM | POA: Diagnosis not present

## 2024-01-14 DIAGNOSIS — I4892 Unspecified atrial flutter: Secondary | ICD-10-CM | POA: Insufficient documentation

## 2024-01-15 ENCOUNTER — Encounter (HOSPITAL_COMMUNITY): Payer: Self-pay

## 2024-01-15 ENCOUNTER — Emergency Department (HOSPITAL_COMMUNITY)

## 2024-01-15 ENCOUNTER — Other Ambulatory Visit: Payer: Self-pay

## 2024-01-15 LAB — COMPREHENSIVE METABOLIC PANEL WITH GFR
ALT: 16 U/L (ref 0–44)
AST: 17 U/L (ref 15–41)
Albumin: 3.5 g/dL (ref 3.5–5.0)
Alkaline Phosphatase: 65 U/L (ref 38–126)
Anion gap: 9 (ref 5–15)
BUN: 17 mg/dL (ref 6–20)
CO2: 23 mmol/L (ref 22–32)
Calcium: 9 mg/dL (ref 8.9–10.3)
Chloride: 106 mmol/L (ref 98–111)
Creatinine, Ser: 0.88 mg/dL (ref 0.44–1.00)
GFR, Estimated: >60 mL/min (ref >60)
Glucose, Bld: 129 mg/dL — ABNORMAL HIGH (ref 70–99)
Potassium: 4 mmol/L (ref 3.5–5.1)
Sodium: 138 mmol/L (ref 135–145)
Total Bilirubin: 0.7 mg/dL (ref 0.0–1.2)
Total Protein: 7.4 g/dL (ref 6.5–8.1)

## 2024-01-15 LAB — CBC
HCT: 43.2 % (ref 36.0–46.0)
Hemoglobin: 13.6 g/dL (ref 12.0–15.0)
MCH: 28.2 pg (ref 26.0–34.0)
MCHC: 31.5 g/dL (ref 30.0–36.0)
MCV: 89.6 fL (ref 80.0–100.0)
Platelets: 302 10*3/uL (ref 150–400)
RBC: 4.82 MIL/uL (ref 3.87–5.11)
RDW: 14.4 % (ref 11.5–15.5)
WBC: 16 10*3/uL — ABNORMAL HIGH (ref 4.0–10.5)
nRBC: 0 % (ref 0.0–0.2)

## 2024-01-15 LAB — TROPONIN I (HIGH SENSITIVITY): Troponin I (High Sensitivity): 7 ng/L (ref <18)

## 2024-01-15 LAB — HCG, SERUM, QUALITATIVE: Preg, Serum: NEGATIVE

## 2024-01-15 MED ORDER — DILTIAZEM HCL-DEXTROSE 125-5 MG/125ML-% IV SOLN (PREMIX)
5.0000 mg/h | INTRAVENOUS | Status: DC
  Filled 2024-01-15: qty 125

## 2024-01-15 NOTE — ED Notes (Signed)
 ED Provider at bedside.

## 2024-01-15 NOTE — ED Triage Notes (Signed)
 Pt. Arrives pov for palpitations and tachycardia. Hx of a-flutter. Endorses nausea and sob. Denies chest pain but endorses chest tightness.

## 2024-01-15 NOTE — ED Notes (Signed)
 [place] PIV placed.

## 2024-01-15 NOTE — ED Provider Notes (Signed)
  EMERGENCY DEPARTMENT AT [facility] Provider Note   CSN: [id number] Arrival date & time: 01/14/24  2356     History  Chief Complaint  Patient presents with   Palpitations    Debbie Lee is a 53 y.o. female.  The history is provided by the patient.  Patient with history of atrial flutter, obesity presents with palpitations.  Patient reports around 3 hours ago she noted her heart rate was increased and felt irregular.  She has had this previously.  She reports over the past several days she has not felt well with nausea and malaise.  She also reports chest tightness.  She has nausea but no vomiting.  No shortness of breath.  No syncope.  Patient reports she did miss her recent morning dose of Eliquis . She has had cardioversion previously Patient reports she has had intermittent episodes similar to this over the past month    Past Medical History:  Diagnosis Date   Anemia    Arthritis    Depression    Hypertension    Panic attack     Home Medications Prior to Admission medications   Medication Sig Start Date End Date Taking? Authorizing Provider  albuterol  (VENTOLIN  HFA) 108 (90 Base) MCG/ACT inhaler Inhale 2 puffs into the lungs every 6 (six) hours as needed for wheezing or shortness of breath. 11/20/22   [name], MD  amiodarone  (PACERONE ) 200 MG tablet Take 1 tablet (200 mg total) by mouth daily. 03/14/23   [name], [name], MD  apixaban  (ELIQUIS ) 5 MG TABS tablet Take 1 tablet (5 mg total) by mouth 2 (two) times daily. 02/08/23   [name], NP  Blood Glucose Monitoring Suppl DEVI 1 each by Does not apply route in the morning, at noon, and at bedtime. May substitute to any manufacturer covered by patient's insurance. 11/20/22   [name], [name], MD  carvedilol  (COREG ) 12.5 MG tablet Take 1 tablet (12.5 mg total) by mouth 2 (two) times daily with a meal. 02/23/23   [name], NP  DULoxetine  (CYMBALTA ) 60 MG capsule Take 60 mg by mouth  daily. 10/22/20   [provider]  empagliflozin  (JARDIANCE ) 25 MG TABS tablet Take 25 mg by mouth daily.    [provider]  furosemide  (LASIX ) 20 MG tablet TAKE 1 TABLET AS NEEDED FOR EDEMA OR FLUID (WEIGHT GAIN OF 2 LBS IN A DAY OR 5 LBS IN A WEEK). 01/06/23   [name], NP  insulin  aspart (NOVOLOG ) 100 UNIT/ML FlexPen CBG 70 - 120: 0 units  CBG 121 - 150: 1 unit  CBG 151 - 200: 2 units  CBG 201 - 250: 3 units  CBG 251 - 300: 5 units  CBG 301 - 350: 7 units  CBG 351 - 400: 9 units 11/20/22   [name], [name], MD  insulin  isophane & regular human KwikPen (NOVOLIN  70/30 KWIKPEN) (70-30) 100 UNIT/ML KwikPen Inject 15 Units into the skin 2 (two) times daily. 11/20/22   [name], [name], MD  Insulin  Pen Needle (PEN NEEDLES 3/16") 31G X 5 MM MISC Inject 15 Units into the skin 2 (two) times daily. 12/15/22   [name], NP  levothyroxine  (SYNTHROID ) 75 MCG tablet Take 75 mcg by mouth daily before breakfast.    [provider]  metFORMIN  (GLUCOPHAGE ) 500 MG tablet Take 1 tablet (500 mg total) by mouth 2 (two) times daily with a meal. 11/20/22   [name], MD  metFORMIN  (GLUCOPHAGE )  500 MG tablet Take 500 mg by mouth 2 (two) times daily with a meal.    [provider]  omeprazole (PRILOSEC) 20 MG capsule Take 20 mg by mouth daily as needed (indigestion).    [provider]  sacubitril -valsartan  (ENTRESTO ) 24-26 MG Take 1 tablet by mouth 2 (two) times daily. 02/22/23   [name], NP  spironolactone  (ALDACTONE ) 25 MG tablet Take 1 tablet (25 mg total) by mouth daily. 12/27/22   Horace Lye, PA-C      Allergies    Codeine and Sulfa antibiotics    Review of Systems   Review of Systems  Constitutional:  Positive for fatigue.  Neurological:  Negative for syncope.    Physical Exam Updated Vital Signs BP (!) 111/56   Pulse (!) 57   Temp (!) 97.5 F (36.4 C) (Oral)   Resp (!) 23   SpO2 95%  Physical Exam CONSTITUTIONAL:  Ill-appearing HEAD: Normocephalic/atraumatic EYES: EOMI/PERRL ENMT: Mucous membranes moist NECK: supple no meningeal signs CV: Tachycardic, no loud murmurs LUNGS: Lungs are clear to auscultation bilaterally, no apparent distress ABDOMEN: soft, nontender NEURO: Pt is awake/alert/appropriate, moves all extremitiesx4.  No facial droop.   EXTREMITIES: pulses normal/equal, full ROM SKIN: warm, color normal PSYCH: no abnormalities of mood noted, alert and oriented to situation  ED Results / Procedures / Treatments   Labs (all labs ordered are listed, but only abnormal results are displayed) Labs Reviewed  CBC - Abnormal; Notable for the following components:      Result Value   WBC 16.0 (*)    All other components within normal limits  COMPREHENSIVE METABOLIC PANEL WITH GFR - Abnormal; Notable for the following components:   Glucose, Bld 129 (*)    All other components within normal limits  HCG, SERUM, QUALITATIVE  TROPONIN I (HIGH SENSITIVITY)    EKG EKG Interpretation Date/Time:  Monday January 15 2024 02:30:31 EDT Ventricular Rate:  66 PR Interval:  144 QRS Duration:  77 QT Interval:  386 QTC Calculation: 405 R Axis:   50  Text Interpretation: Sinus arrhythmia Confirmed by Eldon Greenland ([id number]) on 01/15/2024 2:34:27 AM  Radiology DG Chest Port 1 View Result Date: 01/15/2024 CLINICAL DATA:  Palpitations and tachycardia EXAM: PORTABLE CHEST 1 VIEW COMPARISON:  12/15/2023 FINDINGS: The heart size and mediastinal contours are within normal limits. Both lungs are clear. The visualized skeletal structures are unremarkable. IMPRESSION: No active disease. Electronically Signed   By: Rozell Cornet M.D.   On: 01/15/2024 00:56    Procedures .Critical Care  Performed by: Eldon Greenland, MD Authorized by: Eldon Greenland, MD   Critical care provider statement:    Critical care time (minutes):  35   Critical care start time:  01/15/2024 12:55 AM   Critical care end time:   01/15/2024 1:30 AM   Critical care time was exclusive of:  Separately billable procedures and treating other patients   Critical care was necessary to treat or prevent imminent or life-threatening deterioration of the following conditions:  Cardiac failure   Critical care was time spent personally by me on the following activities:  Obtaining history from patient or surrogate, examination of patient, evaluation of patient's response to treatment, ordering and review of laboratory studies, ordering and review of radiographic studies, pulse oximetry, re-evaluation of patient's condition, review of old charts and development of treatment plan with patient or surrogate   I assumed direction of critical care for this patient from another provider in my specialty: no  Medications Ordered in ED Medications  diltiazem  (CARDIZEM ) 125 mg in dextrose  5% 125 mL (1 mg/mL) infusion ( Intravenous Not Given 01/15/24 0137)    ED Course/ Medical Decision Making/ A&P Clinical Course as of 01/15/24 0235  Mon Jan 15, 2024  0051 WBC(!): 16.0 Leukocytosis [DW]  0056 Patient known history of atrial flutter presents with recurrent episode.  Patient did recently miss a dose of Eliquis .  Also reports recent intermittent episodes.  Will start with Cardizem  drip. [DW]  1610 Patient has spontaneously converted back to atrial flutter.  Labs are overall reassuring.  She is in no acute distress. Patient is safe for discharge home.  I counseled her on ensuring she is taking all of her medicines including anticoagulation she will follow-up with her cardiologist [DW]    Clinical Course User Index [DW] Eldon Greenland, MD                                 Medical Decision Making Amount and/or Complexity of Data Reviewed Labs: ordered. Decision-making details documented in ED Course. Radiology: ordered.  Risk Prescription drug management.   This patient presents to the ED for concern of palpitations, this involves  an extensive number of treatment options, and is a complaint that carries with it a high risk of complications and morbidity.  The differential diagnosis includes but is not limited to atrial flutter, atrial fibrillation, SVT, sinus tachycardia, ventricular tachycardia, WPW  Comorbidities that complicate the patient evaluation: Patient's presentation is complicated by their history of obesity  Social Determinants of Health: Patient's  medication nonadherence   increases the complexity of managing their presentation  Additional history obtained: Records reviewed Care Everywhere/External Records  Lab Tests: I Ordered, and personally interpreted labs.  The pertinent results include: Leukocytosis  Imaging Studies ordered: I ordered imaging studies including X-ray chest   I independently visualized and interpreted imaging which showed no acute findings I agree with the radiologist interpretation  Cardiac Monitoring: The patient was maintained on a cardiac monitor.  I personally viewed and interpreted the cardiac monitor which showed an underlying rhythm of:  Atrial Flutter .  Critical Interventions:   patient monitored for uncontrolled atrial flutter   Reevaluation: After the interventions noted above, I reevaluated the patient and found that they have :improved  Complexity of problems addressed: Patient's presentation is most consistent with  acute presentation with potential threat to life or bodily function  Disposition: After consideration of the diagnostic results and the patient's response to treatment,  I feel that the patent would benefit from discharge   .           Final Clinical Impression(s) / ED Diagnoses Final diagnoses:  Palpitations  Atrial flutter, unspecified type [facility])    Rx / DC Orders ED Discharge Orders     None         Eldon Greenland, MD 01/15/24 0236

## 2024-01-21 ENCOUNTER — Other Ambulatory Visit: Payer: Self-pay

## 2024-01-21 ENCOUNTER — Emergency Department (HOSPITAL_COMMUNITY)

## 2024-01-21 ENCOUNTER — Encounter (HOSPITAL_COMMUNITY): Payer: Self-pay | Admitting: Emergency Medicine

## 2024-01-21 ENCOUNTER — Emergency Department (HOSPITAL_COMMUNITY)
Admission: EM | Admit: 2024-01-21 | Discharge: 2024-01-21 | Disposition: A | Discharge status: Home / Self Care | Attending: Emergency Medicine | Admitting: Emergency Medicine

## 2024-01-21 DIAGNOSIS — Z7901 Long term (current) use of anticoagulants: Secondary | ICD-10-CM | POA: Diagnosis not present

## 2024-01-21 DIAGNOSIS — I509 Heart failure, unspecified: Secondary | ICD-10-CM | POA: Insufficient documentation

## 2024-01-21 DIAGNOSIS — R002 Palpitations: Secondary | ICD-10-CM | POA: Insufficient documentation

## 2024-01-21 DIAGNOSIS — I110 Hypertensive heart disease with heart failure: Secondary | ICD-10-CM | POA: Insufficient documentation

## 2024-01-21 DIAGNOSIS — R42 Dizziness and giddiness: Secondary | ICD-10-CM | POA: Diagnosis not present

## 2024-01-21 DIAGNOSIS — R000 Tachycardia, unspecified: Secondary | ICD-10-CM | POA: Insufficient documentation

## 2024-01-21 DIAGNOSIS — I4891 Unspecified atrial fibrillation: Secondary | ICD-10-CM | POA: Diagnosis not present

## 2024-01-21 DIAGNOSIS — I10 Essential (primary) hypertension: Secondary | ICD-10-CM | POA: Diagnosis not present

## 2024-01-21 DIAGNOSIS — Z79899 Other long term (current) drug therapy: Secondary | ICD-10-CM | POA: Insufficient documentation

## 2024-01-21 DIAGNOSIS — Z794 Long term (current) use of insulin: Secondary | ICD-10-CM | POA: Diagnosis not present

## 2024-01-21 DIAGNOSIS — Z7984 Long term (current) use of oral hypoglycemic drugs: Secondary | ICD-10-CM | POA: Insufficient documentation

## 2024-01-21 LAB — URINALYSIS, W/ REFLEX TO CULTURE (INFECTION SUSPECTED)
Bilirubin Urine: NEGATIVE
Glucose, UA: >=500 mg/dL — AB
Ketones, ur: NEGATIVE mg/dL
Leukocytes,Ua: NEGATIVE
Nitrite: NEGATIVE
Protein, ur: NEGATIVE mg/dL
Specific Gravity, Urine: 1.008 (ref 1.005–1.030)
pH: 6 (ref 5.0–8.0)

## 2024-01-21 LAB — CBC WITH DIFFERENTIAL/PLATELET
Abs Immature Granulocytes: 0.06 10*3/uL (ref 0.00–0.07)
Basophils Absolute: 0 10*3/uL (ref 0.0–0.1)
Basophils Relative: 0 %
Eosinophils Absolute: 0.2 10*3/uL (ref 0.0–0.5)
Eosinophils Relative: 1 %
HCT: 44.5 % (ref 36.0–46.0)
Hemoglobin: 14 g/dL (ref 12.0–15.0)
Immature Granulocytes: 0 %
Lymphocytes Relative: 19 %
Lymphs Abs: 2.6 10*3/uL (ref 0.7–4.0)
MCH: 28.2 pg (ref 26.0–34.0)
MCHC: 31.5 g/dL (ref 30.0–36.0)
MCV: 89.7 fL (ref 80.0–100.0)
Monocytes Absolute: 0.8 10*3/uL (ref 0.1–1.0)
Monocytes Relative: 6 %
Neutro Abs: 10.1 10*3/uL — ABNORMAL HIGH (ref 1.7–7.7)
Neutrophils Relative %: 74 %
Platelets: 277 10*3/uL (ref 150–400)
RBC: 4.96 MIL/uL (ref 3.87–5.11)
RDW: 14.6 % (ref 11.5–15.5)
WBC: 13.8 10*3/uL — ABNORMAL HIGH (ref 4.0–10.5)
nRBC: 0 % (ref 0.0–0.2)

## 2024-01-21 LAB — COMPREHENSIVE METABOLIC PANEL WITH GFR
ALT: 16 U/L (ref 0–44)
AST: 16 U/L (ref 15–41)
Albumin: 3.4 g/dL — ABNORMAL LOW (ref 3.5–5.0)
Alkaline Phosphatase: 57 U/L (ref 38–126)
Anion gap: 10 (ref 5–15)
BUN: 11 mg/dL (ref 6–20)
CO2: 23 mmol/L (ref 22–32)
Calcium: 9.1 mg/dL (ref 8.9–10.3)
Chloride: 105 mmol/L (ref 98–111)
Creatinine, Ser: 0.89 mg/dL (ref 0.44–1.00)
GFR, Estimated: >60 mL/min (ref >60)
Glucose, Bld: 96 mg/dL (ref 70–99)
Potassium: 3.8 mmol/L (ref 3.5–5.1)
Sodium: 138 mmol/L (ref 135–145)
Total Bilirubin: 1 mg/dL (ref 0.0–1.2)
Total Protein: 7.3 g/dL (ref 6.5–8.1)

## 2024-01-21 LAB — TSH: TSH: 1.568 u[IU]/mL (ref 0.350–4.500)

## 2024-01-21 NOTE — ED Notes (Signed)
 Pt noted to be converted to NSR, EKG performed and given to Dr.Osta

## 2024-01-21 NOTE — ED Notes (Signed)
 Pt ambulated to bathroom with steady gait. No needs voiced at this time

## 2024-01-21 NOTE — ED Triage Notes (Signed)
 Pt BIB [facility] from home due to palpitations and change on heart rate in fitbit.  Pt reports that she felt lightheaded and shaky. 20 cardizem  and 500 NS given en route.   HR 130-180

## 2024-01-21 NOTE — ED Provider Notes (Signed)
 [place] EMERGENCY DEPARTMENT AT [facility] Provider Note   CSN: [id number] Arrival date & time: 01/21/24  4098     History  Chief Complaint  Patient presents with   Palpitations   HPI  Debbie Lee is a 53 y.o. female with PMHx afib on eliquis , HTN, DM, CHF with EF of 60-65% presents due to palpitations.  Patient states that around 4 PM today, she began feeling palpitations, and her Fitbit noted heart rate in 130s to 140s.  She also endorses lightheadedness that prompted her to come to the ER.  She denies any cough, ingestion, runny nose, chest pain, shortness of breath, dysuria.  She has been taking her carvedilol  and Eliquis  as prescribed.  Was previously on amiodarone  but has been off of this for a few months now.  HPI     Home Medications Prior to Admission medications   Medication Sig Start Date End Date Taking? Authorizing Provider  albuterol  (VENTOLIN  HFA) 108 (90 Base) MCG/ACT inhaler Inhale 2 puffs into the lungs every 6 (six) hours as needed for wheezing or shortness of breath. 11/20/22   [name], MD  amiodarone  (PACERONE ) 200 MG tablet Take 1 tablet (200 mg total) by mouth daily. 03/14/23   [name], [name], MD  apixaban  (ELIQUIS ) 5 MG TABS tablet Take 1 tablet (5 mg total) by mouth 2 (two) times daily. 02/08/23   [name], NP  Blood Glucose Monitoring Suppl DEVI 1 each by Does not apply route in the morning, at noon, and at bedtime. May substitute to any manufacturer covered by patient's insurance. 11/20/22   [name], [name], MD  carvedilol  (COREG ) 12.5 MG tablet Take 1 tablet (12.5 mg total) by mouth 2 (two) times daily with a meal. 02/23/23   [name], NP  DULoxetine  (CYMBALTA ) 60 MG capsule Take 60 mg by mouth daily. 10/22/20   [provider]  empagliflozin  (JARDIANCE ) 25 MG TABS tablet Take 25 mg by mouth daily.    [provider]  furosemide  (LASIX ) 20 MG tablet TAKE 1 TABLET AS NEEDED FOR EDEMA OR FLUID (WEIGHT  GAIN OF 2 LBS IN A DAY OR 5 LBS IN A WEEK). 01/06/23   [name], NP  insulin  aspart (NOVOLOG ) 100 UNIT/ML FlexPen CBG 70 - 120: 0 units  CBG 121 - 150: 1 unit  CBG 151 - 200: 2 units  CBG 201 - 250: 3 units  CBG 251 - 300: 5 units  CBG 301 - 350: 7 units  CBG 351 - 400: 9 units 11/20/22   [name], [name], MD  insulin  isophane & regular human KwikPen (NOVOLIN  70/30 KWIKPEN) (70-30) 100 UNIT/ML KwikPen Inject 15 Units into the skin 2 (two) times daily. 11/20/22   [name], [name], MD  Insulin  Pen Needle (PEN NEEDLES 3/16") 31G X 5 MM MISC Inject 15 Units into the skin 2 (two) times daily. 12/15/22   [name], NP  levothyroxine  (SYNTHROID ) 75 MCG tablet Take 75 mcg by mouth daily before breakfast.    [provider]  metFORMIN  (GLUCOPHAGE ) 500 MG tablet Take 1 tablet (500 mg total) by mouth 2 (two) times daily with a meal. 11/20/22   [name], MD  metFORMIN  (GLUCOPHAGE ) 500 MG tablet Take 500 mg by mouth 2 (two) times daily with a meal.    [provider]  omeprazole (PRILOSEC) 20 MG capsule Take 20 mg by mouth daily as needed (indigestion).    [provider]  sacubitril -valsartan  (ENTRESTO )  24-26 MG Take 1 tablet by mouth 2 (two) times daily. 02/22/23   [name], NP  spironolactone  (ALDACTONE ) 25 MG tablet Take 1 tablet (25 mg total) by mouth daily. 12/27/22   Horace Lye, PA-C      Allergies    Codeine and Sulfa antibiotics    Review of Systems   Review of Systems  Physical Exam Updated Vital Signs BP 115/70   Pulse 71   Temp 98.4 F (36.9 C) (Oral)   Resp 20   Ht 5\' 2"  (1.575 m)   Wt 128.8 kg   SpO2 97%   BMI 51.94 kg/m  Physical Exam Vitals and nursing note reviewed.  Constitutional:      General: She is not in acute distress.    Appearance: She is well-developed.  HENT:     Head: Normocephalic and atraumatic.     Right Ear: External ear normal.     Left Ear: External ear normal.     Nose: Nose normal.      Mouth/Throat:     Mouth: Mucous membranes are moist.  Eyes:     Conjunctiva/sclera: Conjunctivae normal.  Cardiovascular:     Rate and Rhythm: Tachycardia present. Rhythm irregular.     Heart sounds: No murmur heard. Pulmonary:     Effort: Pulmonary effort is normal. No respiratory distress.     Breath sounds: Normal breath sounds. No wheezing or rales.  Abdominal:     Palpations: Abdomen is soft.     Tenderness: There is no abdominal tenderness. There is no guarding or rebound.  Musculoskeletal:        General: No swelling.     Cervical back: Neck supple.     Right lower leg: No edema.     Left lower leg: No edema.  Skin:    General: Skin is warm and dry.     Capillary Refill: Capillary refill takes less than 2 seconds.  Neurological:     Mental Status: She is alert.  Psychiatric:        Mood and Affect: Mood normal.     ED Results / Procedures / Treatments   Labs (all labs ordered are listed, but only abnormal results are displayed) Labs Reviewed  CBC WITH DIFFERENTIAL/PLATELET - Abnormal; Notable for the following components:      Result Value   WBC 13.8 (*)    Neutro Abs 10.1 (*)    All other components within normal limits  COMPREHENSIVE METABOLIC PANEL WITH GFR - Abnormal; Notable for the following components:   Albumin 3.4 (*)    All other components within normal limits  URINALYSIS, W/ REFLEX TO CULTURE (INFECTION SUSPECTED) - Abnormal; Notable for the following components:   Color, Urine STRAW (*)    Glucose, UA >=500 (*)    Hgb urine dipstick MODERATE (*)    Bacteria, UA RARE (*)    All other components within normal limits  TSH    EKG EKG Interpretation Date/Time:  Sunday January 21 2024 18:30:21 EDT Ventricular Rate:  89 PR Interval:    QRS Duration:  78 QT Interval:  343 QTC Calculation: 429 R Axis:   41  Text Interpretation: Atrial fibrillation Low voltage, precordial leads Borderline T abnormalities, inferior leads Confirmed by Rafael Bun  [phone]) on 01/21/2024 7:27:47 PM  Radiology DG Chest Portable 1 View Result Date: 01/21/2024 EXAM: 1 VIEW(S) XRAY OF THE CHEST 01/21/2024 07:17:28 PM COMPARISON: 01/15/2024 CLINICAL HISTORY: Tachycardia. FINDINGS: LUNGS AND PLEURA: No consolidation. No  pulmonary edema. No pleural effusion. No pneumothorax. HEART AND MEDIASTINUM: No acute abnormality of the cardiac and mediastinal silhouettes. BONES AND SOFT TISSUES: No acute osseous abnormality. IMPRESSION: 1. No acute process. Electronically signed by: Zadie Herter MD 01/21/2024 08:36 PM EDT RP Workstation: [id number]    Procedures Procedures    Medications Ordered in ED Medications - No data to display  ED Course/ Medical Decision Making/ A&P                                 Medical Decision Making Amount and/or Complexity of Data Reviewed Labs: ordered. Radiology: ordered.   Patient is alert, afebrile, and hemodynamically stable in no acute distress.  Physical exam as noted above.  Differentials for patient's tachyarrhythmia includes anemia, electrolyte derangement, AKI, CHF exacerbation, pneumonia, UTI, amongst other diagnoses.  EKG was obtained through triage.  I personally interpreted patient's EKG, which demonstrated atrial fibrillation with rate of 89.  However, on my examination patient's heart rate goes up to 140s.  Will pend her clinical course while obtaining labs.  While labs resulted, patient spontaneously returned to normal sinus rhythm.  I personally interpreted patient's repeat EKG, which demonstrated sinus rhythm with no interval abnormalities, no dysrhythmias or acute ischemic changes.  Labs resulted with mild leukocytosis of 13.8, normal hemoglobin, CMP with no severe electrolyte derangements, no AKI, no transaminitis.  Urinalysis resulted with no infection.  TSH WNL.  I personally interpreted patient's chest x-ray, which demonstrated no focal consolidations, no pneumothorax.  Spoke with patient about reassuring  workup and assessment at this time.  We discussed following up with cardiology for reassessment and potential medication adjustments.  She is agreeable to this plan.  Strict return precautions were given, patient was discharged in stable condition.        Final Clinical Impression(s) / ED Diagnoses Final diagnoses:  Palpitations    Rx / DC Orders ED Discharge Orders     None         Lorain Robson, MD 01/21/24 2127    Sallyanne Creamer, DO 01/29/24 1131

## 2024-01-21 NOTE — Discharge Instructions (Signed)
 You were seen in the ED today for palpitations and arrhythmia.  This resolved on its own.  Please follow-up with your cardiologist for reevaluation.  Please return to the ED for any emergency medical symptoms, including return of palpitations.

## 2024-01-24 DIAGNOSIS — E039 Hypothyroidism, unspecified: Secondary | ICD-10-CM | POA: Diagnosis not present

## 2024-01-24 DIAGNOSIS — E064 Drug-induced thyroiditis: Secondary | ICD-10-CM | POA: Diagnosis not present

## 2024-01-24 DIAGNOSIS — T462X5A Adverse effect of other antidysrhythmic drugs, initial encounter: Secondary | ICD-10-CM | POA: Diagnosis not present

## 2024-02-11 ENCOUNTER — Other Ambulatory Visit: Payer: Self-pay | Admitting: Nurse Practitioner

## 2024-02-12 NOTE — Telephone Encounter (Signed)
This is a A-Fib clinic pt 

## 2024-02-13 ENCOUNTER — Other Ambulatory Visit (HOSPITAL_COMMUNITY): Payer: Self-pay | Admitting: Cardiology

## 2024-02-13 DIAGNOSIS — I10 Essential (primary) hypertension: Secondary | ICD-10-CM

## 2024-02-13 DIAGNOSIS — I480 Paroxysmal atrial fibrillation: Secondary | ICD-10-CM

## 2024-03-05 ENCOUNTER — Other Ambulatory Visit: Payer: Self-pay

## 2024-03-05 ENCOUNTER — Emergency Department (HOSPITAL_COMMUNITY)

## 2024-03-05 ENCOUNTER — Emergency Department (HOSPITAL_COMMUNITY)
Admission: EM | Admit: 2024-03-05 | Discharge: 2024-03-05 | Discharge status: Against Medical Advice | Source: Home / Self Care

## 2024-03-05 DIAGNOSIS — R1013 Epigastric pain: Secondary | ICD-10-CM | POA: Insufficient documentation

## 2024-03-05 DIAGNOSIS — R112 Nausea with vomiting, unspecified: Secondary | ICD-10-CM | POA: Insufficient documentation

## 2024-03-05 DIAGNOSIS — R55 Syncope and collapse: Secondary | ICD-10-CM | POA: Insufficient documentation

## 2024-03-05 DIAGNOSIS — Z5321 Procedure and treatment not carried out due to patient leaving prior to being seen by health care provider: Secondary | ICD-10-CM | POA: Insufficient documentation

## 2024-03-05 DIAGNOSIS — R110 Nausea: Secondary | ICD-10-CM | POA: Diagnosis not present

## 2024-03-05 DIAGNOSIS — R197 Diarrhea, unspecified: Secondary | ICD-10-CM | POA: Insufficient documentation

## 2024-03-05 DIAGNOSIS — R42 Dizziness and giddiness: Secondary | ICD-10-CM | POA: Diagnosis not present

## 2024-03-05 DIAGNOSIS — I959 Hypotension, unspecified: Secondary | ICD-10-CM | POA: Diagnosis not present

## 2024-03-05 DIAGNOSIS — Z7901 Long term (current) use of anticoagulants: Secondary | ICD-10-CM | POA: Insufficient documentation

## 2024-03-05 DIAGNOSIS — I4892 Unspecified atrial flutter: Secondary | ICD-10-CM | POA: Diagnosis not present

## 2024-03-05 LAB — CBC
HCT: 48.9 % — ABNORMAL HIGH (ref 36.0–46.0)
Hemoglobin: 15.1 g/dL — ABNORMAL HIGH (ref 12.0–15.0)
MCH: 27.9 pg (ref 26.0–34.0)
MCHC: 30.9 g/dL (ref 30.0–36.0)
MCV: 90.2 fL (ref 80.0–100.0)
Platelets: 321 10*3/uL (ref 150–400)
RBC: 5.42 MIL/uL — ABNORMAL HIGH (ref 3.87–5.11)
RDW: 14.5 % (ref 11.5–15.5)
WBC: 14.8 10*3/uL — ABNORMAL HIGH (ref 4.0–10.5)
nRBC: 0 % (ref 0.0–0.2)

## 2024-03-05 LAB — COMPREHENSIVE METABOLIC PANEL WITH GFR
ALT: 21 U/L (ref 0–44)
AST: 24 U/L (ref 15–41)
Albumin: 3.8 g/dL (ref 3.5–5.0)
Alkaline Phosphatase: 67 U/L (ref 38–126)
Anion gap: 14 (ref 5–15)
BUN: 16 mg/dL (ref 6–20)
CO2: 22 mmol/L (ref 22–32)
Calcium: 9 mg/dL (ref 8.9–10.3)
Chloride: 104 mmol/L (ref 98–111)
Creatinine, Ser: 1.06 mg/dL — ABNORMAL HIGH (ref 0.44–1.00)
GFR, Estimated: >60 mL/min (ref >60)
Glucose, Bld: 113 mg/dL — ABNORMAL HIGH (ref 70–99)
Potassium: 4.4 mmol/L (ref 3.5–5.1)
Sodium: 140 mmol/L (ref 135–145)
Total Bilirubin: 1.1 mg/dL (ref 0.0–1.2)
Total Protein: 8.4 g/dL — ABNORMAL HIGH (ref 6.5–8.1)

## 2024-03-05 LAB — HCG, SERUM, QUALITATIVE: Preg, Serum: NEGATIVE

## 2024-03-05 LAB — TROPONIN I (HIGH SENSITIVITY): Troponin I (High Sensitivity): <2 ng/L (ref <18)

## 2024-03-05 LAB — LIPASE, BLOOD: Lipase: 31 U/L (ref 11–51)

## 2024-03-05 NOTE — ED Provider Triage Note (Signed)
 Emergency Medicine Provider Triage Evaluation Note  Debbie Lee , a 53 y.o. female  was evaluated in triage.  Pt complains of near syncope, nausea, vomiting, diarrhea. Notes she had pneumococcal and hepatitis vaccines yesterday. Was having a bowel movement today with nausea and vomiting, started to feel lightheaded as well. States she has a history of afib/flutter, complaint with her Eliquis . Thought she was in afib, however EKG shows sinus. Notes that she has had watery diarrhea for months, has appointment for GI in August. Has some epigastric abdominal TTP that started today as well. No chest pain or shortness of breath.  Review of Systems  Positive:  Negative:   Physical Exam  BP 128/64 (BP Location: Left Arm)   Pulse 66   Temp 98.7 F (37.1 C) (Oral)   Resp 16   SpO2 100%  Gen:   Awake, no distress   Resp:  Normal effort  MSK:   Moves extremities without difficulty  Other:    Medical Decision Making  Medically screening exam initiated at 5:39 PM.  Appropriate orders placed.  Debbie Lee was informed that the remainder of the evaluation will be completed by another provider, this initial triage assessment does not replace that evaluation, and the importance of remaining in the ED until their evaluation is complete.     Sherra Dk, PA-C 03/05/24 1745

## 2024-03-05 NOTE — ED Triage Notes (Addendum)
 Pt arriving via [facility] for near syncopal episode with N/V/D. Pt received 4mg  Zofran  and 500mL NS. Hx Afib. Pt has had consistent episodes of diarrhea for months and has spoken with her primary provider about it. Pt did not fall and denies hitting her head. Pt A&O x4 and independently ambulatory.

## 2024-03-06 ENCOUNTER — Other Ambulatory Visit: Payer: Self-pay

## 2024-03-06 ENCOUNTER — Emergency Department (HOSPITAL_COMMUNITY)

## 2024-03-06 ENCOUNTER — Encounter (HOSPITAL_COMMUNITY): Payer: Self-pay

## 2024-03-06 ENCOUNTER — Inpatient Hospital Stay (HOSPITAL_COMMUNITY)
Admission: EM | Admit: 2024-03-06 | Discharge: 2024-03-07 | DRG: 308 | Disposition: A | Discharge status: Home / Self Care | Attending: Internal Medicine | Admitting: Internal Medicine

## 2024-03-06 DIAGNOSIS — D649 Anemia, unspecified: Secondary | ICD-10-CM | POA: Diagnosis present

## 2024-03-06 DIAGNOSIS — Z7901 Long term (current) use of anticoagulants: Secondary | ICD-10-CM

## 2024-03-06 DIAGNOSIS — I10 Essential (primary) hypertension: Secondary | ICD-10-CM | POA: Diagnosis not present

## 2024-03-06 DIAGNOSIS — R7989 Other specified abnormal findings of blood chemistry: Secondary | ICD-10-CM | POA: Diagnosis present

## 2024-03-06 DIAGNOSIS — Z79899 Other long term (current) drug therapy: Secondary | ICD-10-CM | POA: Diagnosis not present

## 2024-03-06 DIAGNOSIS — I110 Hypertensive heart disease with heart failure: Secondary | ICD-10-CM | POA: Diagnosis present

## 2024-03-06 DIAGNOSIS — G4733 Obstructive sleep apnea (adult) (pediatric): Secondary | ICD-10-CM | POA: Diagnosis present

## 2024-03-06 DIAGNOSIS — E8809 Other disorders of plasma-protein metabolism, not elsewhere classified: Secondary | ICD-10-CM | POA: Diagnosis present

## 2024-03-06 DIAGNOSIS — I4892 Unspecified atrial flutter: Secondary | ICD-10-CM | POA: Diagnosis present

## 2024-03-06 DIAGNOSIS — E1165 Type 2 diabetes mellitus with hyperglycemia: Secondary | ICD-10-CM

## 2024-03-06 DIAGNOSIS — R001 Bradycardia, unspecified: Secondary | ICD-10-CM | POA: Diagnosis present

## 2024-03-06 DIAGNOSIS — Z79890 Hormone replacement therapy (postmenopausal): Secondary | ICD-10-CM | POA: Diagnosis not present

## 2024-03-06 DIAGNOSIS — Z7984 Long term (current) use of oral hypoglycemic drugs: Secondary | ICD-10-CM | POA: Diagnosis not present

## 2024-03-06 DIAGNOSIS — Z794 Long term (current) use of insulin: Secondary | ICD-10-CM

## 2024-03-06 DIAGNOSIS — I2489 Other forms of acute ischemic heart disease: Secondary | ICD-10-CM | POA: Diagnosis present

## 2024-03-06 DIAGNOSIS — Z5986 Financial insecurity: Secondary | ICD-10-CM

## 2024-03-06 DIAGNOSIS — I4891 Unspecified atrial fibrillation: Secondary | ICD-10-CM | POA: Diagnosis present

## 2024-03-06 DIAGNOSIS — E119 Type 2 diabetes mellitus without complications: Secondary | ICD-10-CM | POA: Diagnosis present

## 2024-03-06 DIAGNOSIS — Z6841 Body Mass Index (BMI) 40.0 and over, adult: Secondary | ICD-10-CM | POA: Diagnosis not present

## 2024-03-06 DIAGNOSIS — G4730 Sleep apnea, unspecified: Secondary | ICD-10-CM | POA: Diagnosis not present

## 2024-03-06 DIAGNOSIS — K759 Inflammatory liver disease, unspecified: Secondary | ICD-10-CM | POA: Diagnosis present

## 2024-03-06 DIAGNOSIS — D72829 Elevated white blood cell count, unspecified: Secondary | ICD-10-CM | POA: Diagnosis present

## 2024-03-06 DIAGNOSIS — J189 Pneumonia, unspecified organism: Secondary | ICD-10-CM | POA: Diagnosis present

## 2024-03-06 DIAGNOSIS — E872 Acidosis, unspecified: Secondary | ICD-10-CM | POA: Diagnosis present

## 2024-03-06 DIAGNOSIS — I5032 Chronic diastolic (congestive) heart failure: Secondary | ICD-10-CM | POA: Diagnosis present

## 2024-03-06 DIAGNOSIS — Z87891 Personal history of nicotine dependence: Secondary | ICD-10-CM | POA: Diagnosis not present

## 2024-03-06 DIAGNOSIS — I214 Non-ST elevation (NSTEMI) myocardial infarction: Principal | ICD-10-CM

## 2024-03-06 DIAGNOSIS — E66813 Obesity, class 3: Secondary | ICD-10-CM | POA: Diagnosis present

## 2024-03-06 DIAGNOSIS — E782 Mixed hyperlipidemia: Secondary | ICD-10-CM | POA: Diagnosis present

## 2024-03-06 DIAGNOSIS — R002 Palpitations: Secondary | ICD-10-CM | POA: Diagnosis present

## 2024-03-06 DIAGNOSIS — Z885 Allergy status to narcotic agent status: Secondary | ICD-10-CM

## 2024-03-06 DIAGNOSIS — R918 Other nonspecific abnormal finding of lung field: Secondary | ICD-10-CM | POA: Diagnosis not present

## 2024-03-06 DIAGNOSIS — F32A Depression, unspecified: Secondary | ICD-10-CM | POA: Diagnosis present

## 2024-03-06 DIAGNOSIS — E039 Hypothyroidism, unspecified: Secondary | ICD-10-CM | POA: Diagnosis present

## 2024-03-06 HISTORY — DX: Unspecified atrial flutter: I48.92

## 2024-03-06 LAB — MRSA NEXT GEN BY PCR, NASAL: MRSA by PCR Next Gen: NOT DETECTED

## 2024-03-06 LAB — HCG, SERUM, QUALITATIVE: Preg, Serum: NEGATIVE

## 2024-03-06 LAB — COMPREHENSIVE METABOLIC PANEL WITH GFR
ALT: 16 U/L (ref 0–44)
AST: 21 U/L (ref 15–41)
Albumin: 3.7 g/dL (ref 3.5–5.0)
Alkaline Phosphatase: 55 U/L (ref 38–126)
Anion gap: 11 (ref 5–15)
BUN: 13 mg/dL (ref 6–20)
CO2: 23 mmol/L (ref 22–32)
Calcium: 8.8 mg/dL — ABNORMAL LOW (ref 8.9–10.3)
Chloride: 105 mmol/L (ref 98–111)
Creatinine, Ser: 1 mg/dL (ref 0.44–1.00)
GFR, Estimated: >60 mL/min (ref >60)
Glucose, Bld: 125 mg/dL — ABNORMAL HIGH (ref 70–99)
Potassium: 4.4 mmol/L (ref 3.5–5.1)
Sodium: 139 mmol/L (ref 135–145)
Total Bilirubin: 1.1 mg/dL (ref 0.0–1.2)
Total Protein: 7.9 g/dL (ref 6.5–8.1)

## 2024-03-06 LAB — STREP PNEUMONIAE URINARY ANTIGEN: Strep Pneumo Urinary Antigen: NEGATIVE

## 2024-03-06 LAB — MAGNESIUM: Magnesium: 2.1 mg/dL (ref 1.7–2.4)

## 2024-03-06 LAB — HEPARIN LEVEL (UNFRACTIONATED): Heparin Unfractionated: >1.1 [IU]/mL — ABNORMAL HIGH (ref 0.30–0.70)

## 2024-03-06 LAB — CBC
HCT: 47.2 % — ABNORMAL HIGH (ref 36.0–46.0)
Hemoglobin: 14.6 g/dL (ref 12.0–15.0)
MCH: 28.2 pg (ref 26.0–34.0)
MCHC: 30.9 g/dL (ref 30.0–36.0)
MCV: 91.1 fL (ref 80.0–100.0)
Platelets: 316 10*3/uL (ref 150–400)
RBC: 5.18 MIL/uL — ABNORMAL HIGH (ref 3.87–5.11)
RDW: 14.5 % (ref 11.5–15.5)
WBC: 14.4 10*3/uL — ABNORMAL HIGH (ref 4.0–10.5)
nRBC: 0 % (ref 0.0–0.2)

## 2024-03-06 LAB — GLUCOSE, CAPILLARY
Glucose-Capillary: 108 mg/dL — ABNORMAL HIGH (ref 70–99)
Glucose-Capillary: 125 mg/dL — ABNORMAL HIGH (ref 70–99)

## 2024-03-06 LAB — TROPONIN I (HIGH SENSITIVITY)
Troponin I (High Sensitivity): 363 ng/L (ref <18)
Troponin I (High Sensitivity): 469 ng/L (ref <18)

## 2024-03-06 LAB — TSH: TSH: 0.839 u[IU]/mL (ref 0.350–4.500)

## 2024-03-06 LAB — APTT: aPTT: 35 s (ref 24–36)

## 2024-03-06 MED ORDER — HEPARIN (PORCINE) 25000 UT/250ML-% IV SOLN
1000.0000 [IU]/h | INTRAVENOUS | Status: DC
  Administered 2024-03-06: 1000 [IU]/h via INTRAVENOUS
  Filled 2024-03-06: qty 250

## 2024-03-06 MED ORDER — ACETAMINOPHEN 650 MG RE SUPP: 650.0000 mg | Freq: Four times a day (QID) | RECTAL | Status: DC | PRN

## 2024-03-06 MED ORDER — SPIRONOLACTONE 25 MG PO TABS
25.0000 mg | ORAL_TABLET | Freq: Every day | ORAL | Status: DC
  Administered 2024-03-06 – 2024-03-07 (×2): 25 mg via ORAL
  Filled 2024-03-06 (×2): qty 1

## 2024-03-06 MED ORDER — CARVEDILOL 12.5 MG PO TABS: 12.5000 mg | ORAL_TABLET | Freq: Two times a day (BID) | ORAL | Status: DC

## 2024-03-06 MED ORDER — APIXABAN 5 MG PO TABS
5.0000 mg | ORAL_TABLET | Freq: Two times a day (BID) | ORAL | Status: DC
  Administered 2024-03-06 – 2024-03-07 (×3): 5 mg via ORAL
  Filled 2024-03-06 (×3): qty 1

## 2024-03-06 MED ORDER — METOPROLOL TARTRATE 25 MG PO TABS
25.0000 mg | ORAL_TABLET | Freq: Two times a day (BID) | ORAL | Status: DC
  Administered 2024-03-06 – 2024-03-07 (×2): 25 mg via ORAL
  Filled 2024-03-06 (×2): qty 1

## 2024-03-06 MED ORDER — ACETAMINOPHEN 325 MG PO TABS: 650.0000 mg | ORAL_TABLET | Freq: Four times a day (QID) | ORAL | Status: DC | PRN

## 2024-03-06 MED ORDER — EMPAGLIFLOZIN 25 MG PO TABS: 25.0000 mg | ORAL_TABLET | Freq: Every day | ORAL | Status: DC

## 2024-03-06 MED ORDER — CARVEDILOL 12.5 MG PO TABS
12.5000 mg | ORAL_TABLET | Freq: Once | ORAL | Status: AC
  Administered 2024-03-06: 12.5 mg via ORAL
  Filled 2024-03-06: qty 1

## 2024-03-06 MED ORDER — SODIUM CHLORIDE 0.9 % IV SOLN: 1.0000 g | Freq: Once | INTRAVENOUS | Status: DC

## 2024-03-06 MED ORDER — METOPROLOL TARTRATE 5 MG/5ML IV SOLN
5.0000 mg | Freq: Once | INTRAVENOUS | Status: AC
  Administered 2024-03-06: 5 mg via INTRAVENOUS
  Filled 2024-03-06: qty 5

## 2024-03-06 MED ORDER — METFORMIN HCL 500 MG PO TABS: 500.0000 mg | ORAL_TABLET | Freq: Two times a day (BID) | ORAL | Status: DC

## 2024-03-06 MED ORDER — CHLORHEXIDINE GLUCONATE CLOTH 2 % EX PADS
6.0000 | MEDICATED_PAD | Freq: Every day | CUTANEOUS | Status: DC
  Administered 2024-03-06 – 2024-03-07 (×2): 6 via TOPICAL

## 2024-03-06 MED ORDER — ONDANSETRON HCL 4 MG/2ML IJ SOLN: 4.0000 mg | Freq: Four times a day (QID) | INTRAMUSCULAR | Status: DC | PRN

## 2024-03-06 MED ORDER — ASPIRIN 81 MG PO CHEW
324.0000 mg | CHEWABLE_TABLET | ORAL | Status: AC
  Administered 2024-03-06: 324 mg via ORAL
  Filled 2024-03-06: qty 4

## 2024-03-06 MED ORDER — INSULIN ASPART PROT & ASPART (70-30 MIX) 100 UNIT/ML ~~LOC~~ SUSP
8.0000 [IU] | Freq: Two times a day (BID) | SUBCUTANEOUS | Status: DC
  Administered 2024-03-06: 8 [IU] via SUBCUTANEOUS
  Filled 2024-03-06: qty 10

## 2024-03-06 MED ORDER — SODIUM CHLORIDE 0.9 % IV BOLUS (SEPSIS)
1000.0000 mL | Freq: Once | INTRAVENOUS | Status: AC
  Administered 2024-03-06: 1000 mL via INTRAVENOUS

## 2024-03-06 MED ORDER — SODIUM CHLORIDE 0.9 % IV SOLN
500.0000 mg | Freq: Every day | INTRAVENOUS | Status: DC
Start: 2024-03-06 — End: 2024-03-06

## 2024-03-06 MED ORDER — SODIUM CHLORIDE 0.9 % IV SOLN
2.0000 g | Freq: Every day | INTRAVENOUS | Status: DC
Start: 2024-03-06 — End: 2024-03-06

## 2024-03-06 MED ORDER — INSULIN ASPART 100 UNIT/ML IJ SOLN: 0.0000 [IU] | Freq: Three times a day (TID) | INTRAMUSCULAR | Status: DC

## 2024-03-06 MED ORDER — ASPIRIN 325 MG PO TBEC
325.0000 mg | DELAYED_RELEASE_TABLET | Freq: Once | ORAL | Status: DC
  Filled 2024-03-06: qty 1

## 2024-03-06 MED ORDER — ONDANSETRON HCL 4 MG PO TABS
4.0000 mg | ORAL_TABLET | Freq: Four times a day (QID) | ORAL | Status: DC | PRN
Start: 2024-03-06 — End: 2024-03-07

## 2024-03-06 MED ORDER — SODIUM CHLORIDE 0.9 % IV SOLN: 500.0000 mg | Freq: Once | INTRAVENOUS | Status: DC

## 2024-03-06 MED ORDER — SODIUM CHLORIDE 0.9 % IV SOLN
500.0000 mg | INTRAVENOUS | Status: DC
  Administered 2024-03-06: 500 mg via INTRAVENOUS
  Filled 2024-03-06: qty 5

## 2024-03-06 MED ORDER — DILTIAZEM LOAD VIA INFUSION
10.0000 mg | Freq: Once | INTRAVENOUS | Status: AC
  Administered 2024-03-06: 10 mg via INTRAVENOUS
  Filled 2024-03-06: qty 10

## 2024-03-06 MED ORDER — LOPERAMIDE HCL 2 MG PO CAPS: 2.0000 mg | ORAL_CAPSULE | Freq: Four times a day (QID) | ORAL | Status: DC | PRN

## 2024-03-06 MED ORDER — LEVOTHYROXINE SODIUM 75 MCG PO TABS
75.0000 ug | ORAL_TABLET | Freq: Every day | ORAL | Status: DC
  Administered 2024-03-06: 75 ug via ORAL
  Filled 2024-03-06: qty 1

## 2024-03-06 MED ORDER — METOPROLOL TARTRATE 25 MG PO TABS: 25.0000 mg | ORAL_TABLET | Freq: Two times a day (BID) | ORAL | Status: DC

## 2024-03-06 MED ORDER — HEPARIN BOLUS VIA INFUSION
2500.0000 [IU] | Freq: Once | INTRAVENOUS | Status: AC
  Administered 2024-03-06: 2500 [IU] via INTRAVENOUS
  Filled 2024-03-06: qty 2500

## 2024-03-06 MED ORDER — DILTIAZEM HCL 25 MG/5ML IV SOLN
15.0000 mg | Freq: Once | INTRAVENOUS | Status: AC
  Administered 2024-03-06: 15 mg via INTRAVENOUS
  Filled 2024-03-06: qty 5

## 2024-03-06 MED ORDER — DILTIAZEM HCL-DEXTROSE 125-5 MG/125ML-% IV SOLN (PREMIX)
5.0000 mg/h | INTRAVENOUS | Status: DC
  Administered 2024-03-06: 5 mg/h via INTRAVENOUS
  Filled 2024-03-06 (×2): qty 125

## 2024-03-06 MED ORDER — DILTIAZEM LOAD VIA INFUSION
15.0000 mg | Freq: Once | INTRAVENOUS | Status: DC
  Filled 2024-03-06: qty 15

## 2024-03-06 MED ORDER — SACUBITRIL-VALSARTAN 24-26 MG PO TABS
1.0000 | ORAL_TABLET | Freq: Two times a day (BID) | ORAL | Status: DC
  Administered 2024-03-06 – 2024-03-07 (×3): 1 via ORAL
  Filled 2024-03-06 (×3): qty 1

## 2024-03-06 MED ORDER — SODIUM CHLORIDE 0.9 % IV SOLN
2.0000 g | INTRAVENOUS | Status: DC
  Administered 2024-03-06: 2 g via INTRAVENOUS
  Filled 2024-03-06: qty 20

## 2024-03-06 NOTE — Progress Notes (Addendum)
     Patient Name: Debbie Lee           DOB: [birth year]  MRN: [id number]      Admission Date: 03/06/2024  Attending Provider: Danice Dural, MD  Primary Diagnosis: Paroxysmal atrial flutter (HCC)   Level of care: Stepdown   OVERNIGHT PROGRESS REPORT  Notified of bradycardic episode (HR 40's) while on Cardizem  gtt. RN has turned of drip.  Patient has converted to NSR, HR sustaining 60's.     Zamyiah Tino, DNP, ACNPC- AG [facility] [place]

## 2024-03-06 NOTE — Progress Notes (Signed)
 Pt set up on CPAP auto titrate tolerating well.

## 2024-03-06 NOTE — Plan of Care (Signed)

## 2024-03-06 NOTE — ED Triage Notes (Signed)
 Pt. Arrives POV for palpitations. Has hx of the same. States that she has had a rapid HR all night. Endorses chest tightness and SOB. Also states that she has a cough.

## 2024-03-06 NOTE — Consult Note (Addendum)
 Cardiology Consultation:   Patient ID: Debbie Lee MRN: [id number]; DOB: [birth year]  Admit date: 03/06/2024 Date of Consult: 03/06/2024  Primary Care Provider: Virgina Grills, FNP [facility] Cardiologist: Debbie Mediate, MD  [facility] Electrophysiologist:  None    Patient Profile:   Debbie Lee is a 53 y.o. female with a hx of paroxysmal atrial flutter diagnosed 11//2023 and followed by [facility], HFpEF with improved EF back to 60 to 65% with rhythm/rate control of atrial arrhythmias, OSA on CPAP, insulin -dependent diabetes mellitus, hypertension, thyroid  disorder and obesity who is being seen today for the evaluation of recurrent atrial flutter with RVR at the request of Debbie Ryder, MD.     History of Present Illness:   Debbie Lee is a 53 year old obese female with a history of paroxysmal atrial flutter which was initially diagnosed 07/2022.  She has been on Cardizem , Eliquis , amiodarone  and has had 2 cardioversions since then.  She was on amiodarone  for a while but then was taken off because of her young age.  Initially her EF was 30 to 35% when she was diagnosed with atrial flutter but after rate/rhythm control EF normalized to 60 to 65%.  She has been seen by Debbie Lee with EP in December 2020 and medical management at that time was recommended as she was not having a lot of arrhythmias.  April 2025 she ended up back in the ER with palpitations, fatigue, weakness and was found to be back in rapid a flutter with RVR.  She was seen back by cardiology 02/21/2024 and cardiology discussed with EP who felt that best course of action would be right sided isthmus ablation along with pulmonary vein isolation.  This has not been set up yet.  She also has a history of iron  deficiency anemia, arthritis, anxiety, chronic omnia, panic attack, depression, hypertension, mixed lipidemia, DM 2, OSA on CPAP.  She tells me that yesterday she presented to the ER not feeling well.   Apparently she had a near syncopal episode after getting nauseated and vomiting after getting a hepatitis vaccine and pneumococcal vaccine.  She was having a bowel movement at the time and developed nausea and vomiting and then felt lightheaded.  She was concerned by her symptoms and thought it could be her heart and went to the ER..  Workup was fine and she was sent home.    That night she developed palpitations that she said she had all night and decided to go back to the emergency room.  She said that when she got into triage she was in normal rhythm on the telemetry but then went back into atrial flutter with RVR.  Cardiology is now asked to And elevated troponins.  Seen for treatment of atrial fibrillation evaluation of elevated troponins.Labs in the ER included sodium 139, potassium 4.4, Ratan 1, magnesium 2.1, high-sensitivity troponin 363 and 459, hemoglobin 14.6, TSH 0.839.   EKG demonstrated atrial flutter with 2-1 block at 148 bpm.   she was started on IV Cardizem  drip.  Her Eliquis  was not continued as there was concern that she might need further procedures because of her elevated troponin so she was placed on IV Cardizem  drip.  She denies any chest pain but has had some shortness of breath in the setting of atrial flutter with RVR.  She denies any PND, orthopnea, lower extremity edema.   Past Medical History:  Diagnosis Date   Anemia    Arthritis    Depression  Hypertension    Panic attack    Paroxysmal atrial flutter (HCC)    Followed at [facility] by Debbie Lee and is currently in workup for ablation    Past Surgical History:  Procedure Laterality Date   CARDIOVERSION N/A 11/18/2022   Procedure: CARDIOVERSION;  Surgeon: Debbie Sos, MD;  Location: [facility] ENDOSCOPY;  Service: Cardiovascular;  Laterality: N/A;   ESOPHAGOGASTRODUODENOSCOPY N/A 11/15/2022   Procedure: ESOPHAGOGASTRODUODENOSCOPY (EGD);  Surgeon: Debbie Hopper, MD;  Location: [facility] ENDOSCOPY;  Service:  Gastroenterology;  Laterality: N/A;   NO PAST SURGERIES     ORIF ANKLE FRACTURE Right 10/29/2019   Procedure: OPEN REDUCTION INTERNAL FIXATION (ORIF) ANKLE FRACTURE;  Surgeon: Debbie Curl, MD;  Location: [facility] OR;  Service: Orthopedics;  Laterality: Right;   TEE WITHOUT CARDIOVERSION N/A 11/18/2022   Procedure: TRANSESOPHAGEAL ECHOCARDIOGRAM (TEE);  Surgeon: Debbie Sos, MD;  Location: [facility] ENDOSCOPY;  Service: Cardiovascular;  Laterality: N/A;     Home Medications:  Prior to Admission medications   Medication Sig Start Date End Date Taking? Authorizing Provider  albuterol  (VENTOLIN  HFA) 108 (90 Base) MCG/ACT inhaler Inhale 2 puffs into the lungs every 6 (six) hours as needed for wheezing or shortness of breath. 11/20/22  Yes Debbie Horn, MD  apixaban  (ELIQUIS ) 5 MG TABS tablet Take 1 tablet (5 mg total) by mouth 2 (two) times daily. 02/08/23  Yes Debbie Lee., NP  carvedilol  (COREG ) 12.5 MG tablet Take 1 tablet (12.5 mg total) by mouth 2 (two) times daily with a meal. 02/23/23  Yes Debbie Lee., NP  empagliflozin  (JARDIANCE ) 25 MG TABS tablet Take 25 mg by mouth daily.   Yes [provider]  furosemide  (LASIX ) 20 MG tablet TAKE 1 TABLET AS NEEDED FOR EDEMA OR FLUID (WEIGHT GAIN OF 2 LBS IN A DAY OR 5 LBS IN A WEEK). 01/06/23  Yes Debbie Lee., NP  insulin  aspart (NOVOLOG ) 100 UNIT/ML FlexPen CBG 70 - 120: 0 units  CBG 121 - 150: 1 unit  CBG 151 - 200: 2 units  CBG 201 - 250: 3 units  CBG 251 - 300: 5 units  CBG 301 - 350: 7 units  CBG 351 - 400: 9 units Patient taking differently: Inject 0-9 Units into the skin 3 (three) times daily as needed for high blood sugar. CBG 70 - 120: 0 units  CBG 121 - 150: 1 unit  CBG 151 - 200: 2 units  CBG 201 - 250: 3 units  CBG 251 - 300: 5 units  CBG 301 - 350: 7 units  CBG 351 - 400: 9 units 11/20/22  Yes Lee, [name], MD  insulin  isophane & regular human KwikPen (NOVOLIN  70/30 KWIKPEN) (70-30) 100 UNIT/ML KwikPen Inject  15 Units into the skin 2 (two) times daily. Patient taking differently: Inject 8 Units into the skin 2 (two) times daily. 11/20/22  Yes Lee, [name], MD  levothyroxine  (SYNTHROID ) 75 MCG tablet Take 75 mcg by mouth at bedtime.   Yes [provider]  loperamide (IMODIUM A-D) 2 MG tablet Take 2 mg by mouth 4 (four) times daily as needed for diarrhea or loose stools.   Yes [provider]  metFORMIN  (GLUCOPHAGE ) 500 MG tablet Take 500 mg by mouth 2 (two) times daily with a meal.   Yes [provider]  omeprazole (PRILOSEC) 20 MG capsule Take 20 mg by mouth daily as needed (indigestion).   Yes [provider]  sacubitril -valsartan  (ENTRESTO ) 24-26 MG Take 1 tablet by mouth  2 (two) times daily. 02/12/24  Yes Nishan, Peter C, MD  Vitamin D, Ergocalciferol, (DRISDOL) 1.25 MG (50000 UNIT) CAPS capsule Take 50,000 Units by mouth once a week. 01/15/24  Yes [provider]  spironolactone  (ALDACTONE ) 25 MG tablet Take 1 tablet (25 mg total) by mouth daily. Patient not taking: Reported on 03/06/2024 12/27/22   Horace Lye, PA-C    Inpatient Medications: Scheduled Meds:  apixaban   5 mg Oral BID   Chlorhexidine  Gluconate Cloth  6 each Topical Daily   metoprolol  tartrate  25 mg Oral BID   sacubitril -valsartan   1 tablet Oral BID   Continuous Infusions:  azithromycin Stopped (03/06/24 1319)   cefTRIAXone (ROCEPHIN)  IV Stopped (03/06/24 1319)   diltiazem  (CARDIZEM ) infusion 12.5 mg/hr (03/06/24 1320)   PRN Meds: acetaminophen  **OR** acetaminophen , ondansetron  **OR** ondansetron  (ZOFRAN ) IV  Allergies:    Allergies  Allergen Reactions   Codeine Other (See Comments)    Hallucinations. Narcotics cause vomiting.   Sulfa Antibiotics Other (See Comments)    lifelong allergy    Social History:   Social History   Socioeconomic History   Marital status: Divorced    Spouse name: Not on file   Number of children: 1   Years of education: Not on file    Highest education level: Associate degree: occupational, Scientist, product/process development, or vocational program  Occupational History   Not on file  Tobacco Use   Smoking status: Former    Current packs/day: 0.00    Average packs/day: 1 pack/day for 16.0 years (16.0 ttl pk-yrs)    Types: Cigarettes    Start date: 09/26/1986    Quit date: 09/26/2002    Years since quitting: 21.4   Smokeless tobacco: Never   Tobacco comments:    Age [age]  Vaping Use   Vaping status: Never Used  Substance and Sexual Activity   Alcohol use: Not Currently    Comment: 4/year   Drug use: No   Sexual activity: Not Currently    Birth control/protection: Implant  Other Topics Concern   Not on file  Social History Narrative   Not on file   Social Drivers of Health   Financial Resource Strain: High Risk (11/16/2022)   Overall Financial Resource Strain (CARDIA)    Difficulty of Paying Living Expenses: Hard  Food Insecurity: Low Risk  (01/10/2024)   Received from [facility]   Hunger Vital Sign    Worried About Running Out of Food in the Last Year: Never true    Ran Out of Food in the Last Year: Never true  Transportation Needs: No Transportation Needs (08/29/2023)   Received from [facility]    In the past 12 months, has lack of reliable transportation kept you from medical appointments, meetings, work or from getting things needed for daily living? : No  Physical Activity: Not on file  Stress: Not on file  Social Connections: Not on file  Intimate Partner Violence: Not At Risk (11/14/2022)   Humiliation, Afraid, Rape, and Kick questionnaire    Fear of Current or Ex-Partner: No    Emotionally Abused: No    Physically Abused: No    Sexually Abused: No    Family History:    Family History  Adopted: Yes     ROS:  Please see the history of present illness.   All other ROS reviewed and negative.     Physical Exam/Data:   Vitals:   03/06/24 1131 03/06/24 1144 03/06/24 1217 03/06/24 1300  BP:  107/82 109/72 (!) 118/55   Pulse: (!) 146 (!) 103 (!) 147 97  Resp:  15 (!) 26 (!) 23  Temp:   98.1 F (36.7 C)   TempSrc:   Oral   SpO2: 98% 95% 97% 97%  Weight:   127.9 kg   Height:   5' 2 (1.575 m)     Intake/Output Summary (Last 24 hours) at 03/06/2024 1513 Last data filed at 03/06/2024 1320 Gross per 24 hour  Intake 100.95 ml  Output --  Net 100.95 ml      03/06/2024   12:17 PM 03/06/2024    7:45 AM 01/21/2024    6:24 PM  Last 3 Weights  Weight (lbs) 281 lb 15.5 oz 285 lb 284 lb  Weight (kg) 127.9 kg 129.275 kg 128.822 kg     Body mass index is 51.57 kg/m.  General:  Well nourished, well developed, in no acute distress HEENT: normal Lymph: no adenopathy Neck: no JVD Endocrine:  No thryomegaly Vascular: No carotid bruits; FA pulses 2+ bilaterally without bruits  Cardiac:  normal S1, S2; regular and tachycardic; no murmur  Lungs:  clear to auscultation bilaterally, no wheezing, rhonchi or rales  Abd: soft, nontender, no hepatomegaly  Ext: no edema Musculoskeletal:  No deformities, BUE and BLE strength normal and equal Skin: warm and dry  Neuro:  CNs 2-12 intact, no focal abnormalities noted Psych:  Normal affect   EKG:  The EKG was personally reviewed and demonstrates: Atrial flutter with 2-1 block and RVR at 148 bpm Telemetry:  Telemetry was personally reviewed and demonstrates: Atrial flutter with RVR  Laboratory Data:  High Sensitivity Troponin:   Recent Labs  Lab 03/05/24 1739 03/06/24 0605 03/06/24 0815  TROPONINIHS <2 363* 469*     Chemistry Recent Labs  Lab 03/05/24 1722 03/06/24 0605  NA 140 139  K 4.4 4.4  CL 104 105  CO2 22 23  GLUCOSE 113* 125*  BUN 16 13  CREATININE 1.06* 1.00  CALCIUM 9.0 8.8*  GFRNONAA >60 >60  ANIONGAP 14 11    Recent Labs  Lab 03/05/24 1722 03/06/24 0605  PROT 8.4* 7.9  ALBUMIN 3.8 3.7  AST 24 21  ALT 21 16  ALKPHOS 67 55  BILITOT 1.1 1.1   Hematology Recent Labs  Lab 03/05/24 1722 03/06/24 0605   WBC 14.8* 14.4*  RBC 5.42* 5.18*  HGB 15.1* 14.6  HCT 48.9* 47.2*  MCV 90.2 91.1  MCH 27.9 28.2  MCHC 30.9 30.9  RDW 14.5 14.5  PLT 321 316   BNPNo results for input(s): BNP, PROBNP in the last 168 hours.  DDimer No results for input(s): DDIMER in the last 168 hours.   Radiology/Studies:  DG Chest 2 View Result Date: 03/06/2024 CLINICAL DATA:  53 year old female with palpitations, chest pain. EXAM: CHEST - 2 VIEW COMPARISON:  Chest radiographs yesterday and earlier. FINDINGS: Semi upright AP and lateral views of the chest at 0628 hours. Lung volumes and mediastinal contours remain normal. Visualized tracheal air column is within normal limits. No pneumothorax, pulmonary edema, pleural effusion or consolidation. However, there is streaky new right infrahilar opacity on the AP view, which on the lateral appears likely to be peribronchial opacity in the lower lobe. Lung markings elsewhere appears stable and negative. No acute osseous abnormality identified. Paucity of bowel gas in the upper abdomen. IMPRESSION: New right lower lobe peribronchial opacity since yesterday is nonspecific but might reflect bronchopneumonia. Query signs/symptoms of infection. No pleural effusion. Electronically Signed  By: Marlise Simpers M.D.   On: 03/06/2024 06:42   DG Chest 2 View Result Date: 03/05/2024 CLINICAL DATA:  Near syncope EXAM: CHEST - 2 VIEW COMPARISON:  01/21/2024 FINDINGS: The heart size and mediastinal contours are within normal limits. Both lungs are clear. The visualized skeletal structures are unremarkable. IMPRESSION: No active cardiopulmonary disease. Electronically Signed   By: Janeece Mechanic M.D.   On: 03/05/2024 18:07     Assessment and Plan:   #Atrial flutter with RVR - She has a history of atrial fibrillation/flutter and is followed at [facility] - She had been on amiodarone  for a while but that was discontinued due to her young age - Recently has been having increased frequency of  episodes of atrial flutter and has had 2 cardioversions - Seeing EP with plans for a flutter ablation in the near future - TSH, K+ and Mag are normal  - She is on Eliquis  5 mg twice daily with no missed doses in the past 3 weeks (she did take her dose last night but has not received a dose this morning because she was placed on IV heparin ) - Stop IV heparin  and give her her morning dose of Eliquis  now and then start Eliquis  5 mg twice daily thereafter - Continue IV Cardizem  drip - Start Lopressor  25 mg twice daily for heart rate control>> BP has been soft at times so may be difficult to titrate - Atrial flutter is known to be difficult to control from a heart rate standpoint and therefore I think we should plan on DCCV in the a.m. - Check 2D echo to reassess LV function - Will make n.p.o. after midnight -Informed Consent   Shared Decision Making/Informed Consent The risks (stroke, cardiac arrhythmias rarely resulting in the need for a temporary or permanent pacemaker, skin irritation or burns and complications associated with conscious sedation including aspiration, arrhythmia, respiratory failure and death), benefits (restoration of normal sinus rhythm) and alternatives of a direct current cardioversion were explained in detail to Ms. Spray and she agrees to proceed.   #Chronic diastolic CHF  #Hypertension - BP controlled - Her most recent 2D echo that we have is from 03/14/2023 showing EF 60 to 65% with normal RV function and no significant valvular disease and normal LA size.  Right atrium was enlarged. - She does not appear volume overloaded on exam - GDMT: PTA she was on carvedilol  12.5 mg twice daily, Entresto  24-26 mg twice daily, Jardiance  25 mg daily, spironolactone  25 mg daily and Lasix  20 mg daily as needed for edema - Carvedilol  stopped in favor of Lopressor  for better heart rate control - Continue Entresto  24-26 mg twice daily - Restart spironolactone  25 mg daily - Hold Jardiance   since she needs anesthesia for cardioversion and restart at discharge - Follow strict I's and O's, daily weights and renal function  #Obstructive sleep apnea -Continue CPAP therapy -Would recommend she get in with a sleep medicine physician to make sure that her OSA is adequately treated on current settings and there is no nocturnal hypoxemia that could trigger further arrhythmias especially since this last episode occurred at night   CHA2DS2-VASc Score = 4   This indicates a 4.8% annual risk of stroke. The patient's score is based upon: CHF History: 1 HTN History: 1 Diabetes History: 1 Stroke History: 0 Vascular Disease History: 0 Age Score: 0 Gender Score: 1        For questions or updates, please contact [facility] Please consult  [email or web address] for contact info under    Signed, Gaylyn Keas, MD  03/06/2024 3:13 PM

## 2024-03-06 NOTE — Progress Notes (Signed)
   03/06/24 2318  BiPAP/CPAP/SIPAP  $ Non-Invasive Home Ventilator  Initial  $ Face Mask Medium Yes  BiPAP/CPAP/SIPAP Pt Type Adult  BiPAP/CPAP/SIPAP DREAMSTATIOND  Mask Type Full face mask  Dentures removed? Not applicable  Mask Size Medium  Patient Home Machine No  Patient Home Mask No  Patient Home Tubing No  Auto Titrate Yes  Nasal massage performed Yes  BiPAP/CPAP /SiPAP Vitals  Pulse Rate 72  Resp (!) 21  SpO2 98 %  Bilateral Breath Sounds Clear  MEWS Score/Color  MEWS Score 1  MEWS Score Color Green

## 2024-03-06 NOTE — ED Provider Notes (Signed)
 [name] AT [name] Provider Note   CSN: [id number] Arrival date & time: 03/06/24  7829     History  Chief Complaint  Patient presents with   Palpitations    Debbie Lee is a 53 y.o. female.  53 year old female with past medical history significant for A-fib on Eliquis  presents today for concern of palpitations, and rapid heart rate since last night.  She states she had an episode of emesis yesterday and went into the emergency department but it appears she left prior to being seen.  She denies any cough, chest pain, or shortness of breath prior to the onset of palpitations and rapid heart rate.  She states she used her Fitbit to confirm that she had elevated heart rate.  She does have some chest tightness now.   The history is provided by the patient. No language interpreter was used.       Home Medications Prior to Admission medications   Medication Sig Start Date End Date Taking? Authorizing Provider  albuterol  (VENTOLIN  HFA) 108 (90 Base) MCG/ACT inhaler Inhale 2 puffs into the lungs every 6 (six) hours as needed for wheezing or shortness of breath. 11/20/22   [name], MD  amiodarone  (PACERONE ) 200 MG tablet Take 1 tablet (200 mg total) by mouth daily. 03/14/23   [name], [name], MD  apixaban  (ELIQUIS ) 5 MG TABS tablet Take 1 tablet (5 mg total) by mouth 2 (two) times daily. 02/08/23   [name], NP  Blood Glucose Monitoring Suppl DEVI 1 each by Does not apply route in the morning, at noon, and at bedtime. May substitute to any manufacturer covered by patient's insurance. 11/20/22   [name], [name], MD  carvedilol  (COREG ) 12.5 MG tablet Take 1 tablet (12.5 mg total) by mouth 2 (two) times daily with a meal. 02/23/23   [name], NP  DULoxetine  (CYMBALTA ) 60 MG capsule Take 60 mg by mouth daily. 10/22/20   [provider]  empagliflozin  (JARDIANCE ) 25 MG TABS tablet Take 25 mg by mouth daily.    [provider]  furosemide  (LASIX ) 20 MG tablet TAKE 1 TABLET AS NEEDED FOR EDEMA OR FLUID (WEIGHT GAIN OF 2 LBS IN A DAY OR 5 LBS IN A WEEK). 01/06/23   [name], NP  insulin  aspart (NOVOLOG ) 100 UNIT/ML FlexPen CBG 70 - 120: 0 units  CBG 121 - 150: 1 unit  CBG 151 - 200: 2 units  CBG 201 - 250: 3 units  CBG 251 - 300: 5 units  CBG 301 - 350: 7 units  CBG 351 - 400: 9 units 11/20/22   [name], [name], MD  insulin  isophane & regular human KwikPen (NOVOLIN  70/30 KWIKPEN) (70-30) 100 UNIT/ML KwikPen Inject 15 Units into the skin 2 (two) times daily. 11/20/22   [name], [name], MD  Insulin  Pen Needle (PEN NEEDLES 3/16) 31G X 5 MM MISC Inject 15 Units into the skin 2 (two) times daily. 12/15/22   [name], NP  levothyroxine  (SYNTHROID ) 75 MCG tablet Take 75 mcg by mouth daily before breakfast.    [provider]  metFORMIN  (GLUCOPHAGE ) 500 MG tablet Take 1 tablet (500 mg total) by mouth 2 (two) times daily with a meal. 11/20/22   [name], MD  metFORMIN  (GLUCOPHAGE ) 500 MG tablet Take 500 mg by mouth 2 (two) times daily with a meal.    [provider]  omeprazole (PRILOSEC) 20 MG capsule Take 20 mg  by mouth daily as needed (indigestion).    [provider]  sacubitril -valsartan  (ENTRESTO ) 24-26 MG Take 1 tablet by mouth 2 (two) times daily. 02/12/24   [name], MD  spironolactone  (ALDACTONE ) 25 MG tablet Take 1 tablet (25 mg total) by mouth daily. 12/27/22   Horace Lye, PA-C      Allergies    Codeine and Sulfa antibiotics    Review of Systems   Review of Systems  Constitutional:  Negative for chills and fever.  Respiratory:  Negative for cough and shortness of breath.   Cardiovascular:  Positive for chest pain. Negative for palpitations and leg swelling.  Gastrointestinal:  Negative for abdominal pain.  Neurological:  Negative for light-headedness.  All other systems reviewed and are negative.   Physical Exam Updated Vital Signs BP  101/78 (BP Location: Right Arm)   Pulse 76   Temp 98 F (36.7 C) (Oral)   Resp 20   Ht 5' 2 (1.575 m)   Wt 129.3 kg   SpO2 97%   BMI 52.13 kg/m  Physical Exam Vitals and nursing note reviewed.  Constitutional:      General: She is not in acute distress.    Appearance: Normal appearance. She is not ill-appearing.  HENT:     Head: Normocephalic and atraumatic.     Nose: Nose normal.  Eyes:     Conjunctiva/sclera: Conjunctivae normal.  Cardiovascular:     Rate and Rhythm: Normal rate and regular rhythm.  Pulmonary:     Effort: Pulmonary effort is normal. No respiratory distress.     Breath sounds: Normal breath sounds. No wheezing.  Musculoskeletal:        General: No deformity.     Right lower leg: No edema.     Left lower leg: No edema.  Skin:    Findings: No rash.  Neurological:     Mental Status: She is alert.     ED Results / Procedures / Treatments   Labs (all labs ordered are listed, but only abnormal results are displayed) Labs Reviewed  CBC - Abnormal; Notable for the following components:      Result Value   WBC 14.4 (*)    RBC 5.18 (*)    HCT 47.2 (*)    All other components within normal limits  COMPREHENSIVE METABOLIC PANEL WITH GFR - Abnormal; Notable for the following components:   Glucose, Bld 125 (*)    Calcium 8.8 (*)    All other components within normal limits  TROPONIN I (HIGH SENSITIVITY) - Abnormal; Notable for the following components:   Troponin I (High Sensitivity) 363 (*)    All other components within normal limits  HCG, SERUM, QUALITATIVE  APTT  HEPARIN  LEVEL (UNFRACTIONATED)  APTT  TROPONIN I (HIGH SENSITIVITY)    EKG EKG Interpretation Date/Time:  Wednesday March 06 2024 05:53:36 EDT Ventricular Rate:  148 PR Interval:  85 QRS Duration:  115 QT Interval:  322 QTC Calculation: 506 R Axis:   53  Text Interpretation: Atrial flutter Nonspecific intraventricular conduction delay Nonspecific T abnormalities, diffuse leads  Baseline wander in lead(s) II III aVF V3 V6 Confirmed by Eldon Greenland ([id number]) on 03/06/2024 5:57:34 AM  Radiology DG Chest 2 View Result Date: 03/06/2024 CLINICAL DATA:  53 year old female with palpitations, chest pain. EXAM: CHEST - 2 VIEW COMPARISON:  Chest radiographs yesterday and earlier. FINDINGS: Semi upright AP and lateral views of the chest at 0628 hours. Lung volumes and mediastinal contours remain normal. Visualized tracheal  air column is within normal limits. No pneumothorax, pulmonary edema, pleural effusion or consolidation. However, there is streaky new right infrahilar opacity on the AP view, which on the lateral appears likely to be peribronchial opacity in the lower lobe. Lung markings elsewhere appears stable and negative. No acute osseous abnormality identified. Paucity of bowel gas in the upper abdomen. IMPRESSION: New right lower lobe peribronchial opacity since yesterday is nonspecific but might reflect bronchopneumonia. Query signs/symptoms of infection. No pleural effusion. Electronically Signed   By: Marlise Simpers M.D.   On: 03/06/2024 06:42   DG Chest 2 View Result Date: 03/05/2024 CLINICAL DATA:  Near syncope EXAM: CHEST - 2 VIEW COMPARISON:  01/21/2024 FINDINGS: The heart size and mediastinal contours are within normal limits. Both lungs are clear. The visualized skeletal structures are unremarkable. IMPRESSION: No active cardiopulmonary disease. Electronically Signed   By: Janeece Mechanic M.D.   On: 03/05/2024 18:07    Procedures .Critical Care  Performed by: Lucina Sabal, PA-C Authorized by: Lucina Sabal, PA-C   Critical care provider statement:    Critical care time (minutes):  40   Critical care was necessary to treat or prevent imminent or life-threatening deterioration of the following conditions: A flutter with RVR, ACS.   Critical care was time spent personally by me on the following activities:  Development of treatment plan with patient or surrogate, discussions with  consultants, evaluation of patient's response to treatment, examination of patient, ordering and review of laboratory studies, ordering and review of radiographic studies, ordering and performing treatments and interventions, pulse oximetry, re-evaluation of patient's condition and review of old charts   Care discussed with: admitting provider       Medications Ordered in ED Medications  heparin  bolus via infusion 2,500 Units (has no administration in time range)  heparin  ADULT infusion 100 units/mL (25000 units/250mL) (has no administration in time range)  sodium chloride  0.9 % bolus 1,000 mL (0 mLs Intravenous Stopped 03/06/24 0803)  diltiazem  (CARDIZEM ) injection 15 mg (15 mg Intravenous Given 03/06/24 0748)  aspirin  chewable tablet 324 mg (324 mg Oral Given 03/06/24 0749)    ED Course/ Medical Decision Making/ A&P Clinical Course as of 03/06/24 0816  Wed Mar 06, 2024  0711 hCG, serum, qualitative [AA]    Clinical Course User Index [AA] Lucina Sabal, PA-C                                 Medical Decision Making Amount and/or Complexity of Data Reviewed Labs: ordered. Decision-making details documented in ED Course.  Risk OTC drugs. Prescription drug management. Decision regarding hospitalization.   Medical Decision Making / ED Course   This patient presents to the ED for concern of palpitations, this involves an extensive number of treatment options, and is a complaint that carries with it a high risk of complications and morbidity.  The differential diagnosis includes A-fib, flutter, SVT,   MDM: 53 year old female with past medical significant for A-flutter on Eliquis  presents for concern of palpitations.  Heart rates of 140s on my evaluation.  Will give diltiazem  load and reevaluate.  Blood work pending.  CBC with leukocytosis of 14.4.  Normal hemoglobin.  CMP without acute concerns.  No show glucose 125.  Troponin of 363.  Aspirin  and heparin  ordered.  Chest x-ray shows  potential pneumonia.  EKG with A-flutter.  Rates vary from 135-148.  Discussed with cardiology.  They recommend rate control will diltiazem   infusion.  Do not recommend cardioversion unless patient becomes unstable.  Recommend admission to medicine due to pneumonia.  Patient okay to stay at [facility] unless patient becomes unstable.  Patient made aware of plan. Antibiotics ordered. Will discuss with hospitalist for admission. Discussed with Dr. Bonita Bussing.  He will evaluate patient for admission.  After discussion and has requested it would work correctable 0.5 which is patient's home medicine.  Patient is adamant that she has not missed any doses.  Low risk for PE on Wells criteria.   Additional history obtained: -Additional history obtained from chart review -External records from outside source obtained and reviewed including: Chart review including previous notes, labs, imaging, consultation notes   Lab Tests: -I ordered, reviewed, and interpreted labs.   The pertinent results include:   Labs Reviewed  CBC - Abnormal; Notable for the following components:      Result Value   WBC 14.4 (*)    RBC 5.18 (*)    HCT 47.2 (*)    All other components within normal limits  COMPREHENSIVE METABOLIC PANEL WITH GFR - Abnormal; Notable for the following components:   Glucose, Bld 125 (*)    Calcium 8.8 (*)    All other components within normal limits  TROPONIN I (HIGH SENSITIVITY) - Abnormal; Notable for the following components:   Troponin I (High Sensitivity) 363 (*)    All other components within normal limits  HCG, SERUM, QUALITATIVE  APTT  HEPARIN  LEVEL (UNFRACTIONATED)  APTT  TROPONIN I (HIGH SENSITIVITY)      EKG  EKG Interpretation Date/Time:  Wednesday March 06 2024 05:53:36 EDT Ventricular Rate:  148 PR Interval:  85 QRS Duration:  115 QT Interval:  322 QTC Calculation: 506 R Axis:   53  Text Interpretation: Atrial flutter Nonspecific intraventricular conduction delay  Nonspecific T abnormalities, diffuse leads Baseline wander in lead(s) II III aVF V3 V6 Confirmed by Eldon Greenland ([id number]) on 03/06/2024 5:57:34 AM         Imaging Studies ordered: I ordered imaging studies including chest x-ray I independently visualized and interpreted imaging. I agree with the radiologist interpretation   Medicines ordered and prescription drug management: Meds ordered this encounter  Medications   sodium chloride  0.9 % bolus 1,000 mL   DISCONTD: diltiazem  (CARDIZEM ) 1 mg/mL load via infusion 15 mg   diltiazem  (CARDIZEM ) injection 15 mg   DISCONTD: aspirin  EC tablet 325 mg   aspirin  chewable tablet 324 mg   heparin  bolus via infusion 2,500 Units   heparin  ADULT infusion 100 units/mL (25000 units/250mL)    -I have reviewed the patients home medicines and have made adjustments as needed  Critical interventions Diltiazem  load and infusion, heparin  drip for ACS  Consultations Obtained: I requested consultation with the cardiology,  and discussed lab and imaging findings as well as pertinent plan - they recommend: As above   Cardiac Monitoring: The patient was maintained on a cardiac monitor.  I personally viewed and interpreted the cardiac monitored which showed an underlying rhythm of: A flutter with rates of 140s  Reevaluation: After the interventions noted above, I reevaluated the patient and found that they have :stayed the same  Co morbidities that complicate the patient evaluation  Past Medical History:  Diagnosis Date   Anemia    Arthritis    Depression    Hypertension    Panic attack       Dispostion: Discussed with hospitalist will evaluate patient admission    Final Clinical Impression(s) / ED  Diagnoses Final diagnoses:  NSTEMI (non-ST elevated myocardial infarction) [facility])  Atrial flutter with rapid ventricular response [facility])    Rx / DC Orders ED Discharge Orders     None         Lucina Sabal, PA-C 03/06/24 0915     Albertus Hughs, DO 03/06/24 1008

## 2024-03-06 NOTE — Progress Notes (Deleted)
.  [facility]

## 2024-03-06 NOTE — H&P (Signed)
 History and Physical    Patient: Debbie Lee WNU:[id number] DOB: Nov 22, 1970 DOA: 03/06/2024 DOS: the patient was seen and examined on 03/06/2024 PCP: Virgina Grills, FNP  Patient coming from: Home  Chief Complaint:  Chief Complaint  Patient presents with   Palpitations   HPI: Debbie Lee is a 53 y.o. female with medical history significant of iron  deficiency anemia, anemia, arthritis, anxiety, chronic insomnia, panic attacks, depression, chronic back pain, hypertension, mixed hyperlipidemia, class III obesity, type 2 diabetes, severe sleep apnea, vitamin B12 deficiency, vitamin D deficiency, atrial fibrillation who presented to the emergency department complaints of palpitations since yesterday evening associated with cough.  The cough is nonproductive.  No  palpitations, diaphoresis, PND, orthopnea or pitting edema of the lower extremities. She denied fever, chills, sore throat, rhinorrhea, wheezing or hemoptysis. No abdominal pain, nausea, emesis, diarrhea, constipation, melena or hematochezia.  No flank pain, dysuria, frequency or hematuria.  No polyuria, polydipsia, polyphagia or blurred vision.   Lab work: CBC showed white count 14.4, hemoglobin 14.6 g/dL platelets 034.  PTT was 35 seconds.  Troponin was 363, then 469 ng/L.  Magnesium was 2.1 mg/dL.  CMP showed a glucose of 125 mg/dL, the rest of the CMP measurements were normal after calcium correction.  Imaging: 2 view chest radiograph showing new right lower lobe peribronchial opacity that may represent bronchopneumonia.  No pleural effusion.   ED course: Initial vital signs were temperature 98 F, pulse 76, venue has been in the 140s, respiration 20, BP 101/78 mmHg O2 sat 97% on room air.  The patient received aspirin  324 mg p.o., normal saline 1000 mm bolus, diltiazem  15 mg IVP followed by continuous diltiazem  drip and was started on heparin  infusion.  Review of Systems: As mentioned in the history of present illness. All other  systems reviewed and are negative. Past Medical History:  Diagnosis Date   Anemia    Arthritis    Depression    Hypertension    Panic attack    Past Surgical History:  Procedure Laterality Date   CARDIOVERSION N/A 11/18/2022   Procedure: CARDIOVERSION;  Surgeon: Maudine Sos, MD;  Location: [facility] ENDOSCOPY;  Service: Cardiovascular;  Laterality: N/A;   ESOPHAGOGASTRODUODENOSCOPY N/A 11/15/2022   Procedure: ESOPHAGOGASTRODUODENOSCOPY (EGD);  Surgeon: Felecia Hopper, MD;  Location: [facility] ENDOSCOPY;  Service: Gastroenterology;  Laterality: N/A;   NO PAST SURGERIES     ORIF ANKLE FRACTURE Right 10/29/2019   Procedure: OPEN REDUCTION INTERNAL FIXATION (ORIF) ANKLE FRACTURE;  Surgeon: Saundra Curl, MD;  Location: [facility] OR;  Service: Orthopedics;  Laterality: Right;   TEE WITHOUT CARDIOVERSION N/A 11/18/2022   Procedure: TRANSESOPHAGEAL ECHOCARDIOGRAM (TEE);  Surgeon: Maudine Sos, MD;  Location: [facility] ENDOSCOPY;  Service: Cardiovascular;  Laterality: N/A;   Social History:  reports that she quit smoking about 21 years ago. Her smoking use included cigarettes. She started smoking about 37 years ago. She has a 16 pack-year smoking history. She has never used smokeless tobacco. She reports that she does not currently use alcohol. She reports that she does not use drugs.  Allergies  Allergen Reactions   Codeine Other (See Comments)    Hallucinations. Narcotics cause vomiting.   Sulfa Antibiotics Other (See Comments)    lifelong allergy    Family History  Adopted: Yes    Prior to Admission medications   Medication Sig Start Date End Date Taking? Authorizing Provider  albuterol  (VENTOLIN  HFA) 108 (90 Base) MCG/ACT inhaler Inhale 2 puffs into the lungs every 6 (six) hours  as needed for wheezing or shortness of breath. 11/20/22   [name], MD  amiodarone  (PACERONE ) 200 MG tablet Take 1 tablet (200 mg total) by mouth daily. 03/14/23   [name], [name], MD  apixaban  (ELIQUIS ) 5 MG  TABS tablet Take 1 tablet (5 mg total) by mouth 2 (two) times daily. 02/08/23   [name], NP  Blood Glucose Monitoring Suppl DEVI 1 each by Does not apply route in the morning, at noon, and at bedtime. May substitute to any manufacturer covered by patient's insurance. 11/20/22   [name], [name], MD  carvedilol  (COREG ) 12.5 MG tablet Take 1 tablet (12.5 mg total) by mouth 2 (two) times daily with a meal. 02/23/23   [name], NP  DULoxetine  (CYMBALTA ) 60 MG capsule Take 60 mg by mouth daily. 10/22/20   [provider]  empagliflozin  (JARDIANCE ) 25 MG TABS tablet Take 25 mg by mouth daily.    [provider]  furosemide  (LASIX ) 20 MG tablet TAKE 1 TABLET AS NEEDED FOR EDEMA OR FLUID (WEIGHT GAIN OF 2 LBS IN A DAY OR 5 LBS IN A WEEK). 01/06/23   [name], NP  insulin  aspart (NOVOLOG ) 100 UNIT/ML FlexPen CBG 70 - 120: 0 units  CBG 121 - 150: 1 unit  CBG 151 - 200: 2 units  CBG 201 - 250: 3 units  CBG 251 - 300: 5 units  CBG 301 - 350: 7 units  CBG 351 - 400: 9 units 11/20/22   [name], [name], MD  insulin  isophane & regular human KwikPen (NOVOLIN  70/30 KWIKPEN) (70-30) 100 UNIT/ML KwikPen Inject 15 Units into the skin 2 (two) times daily. 11/20/22   [name], [name], MD  Insulin  Pen Needle (PEN NEEDLES 3/16) 31G X 5 MM MISC Inject 15 Units into the skin 2 (two) times daily. 12/15/22   [name], NP  levothyroxine  (SYNTHROID ) 75 MCG tablet Take 75 mcg by mouth daily before breakfast.    [provider]  metFORMIN  (GLUCOPHAGE ) 500 MG tablet Take 1 tablet (500 mg total) by mouth 2 (two) times daily with a meal. 11/20/22   [name], MD  metFORMIN  (GLUCOPHAGE ) 500 MG tablet Take 500 mg by mouth 2 (two) times daily with a meal.    [provider]  omeprazole (PRILOSEC) 20 MG capsule Take 20 mg by mouth daily as needed (indigestion).    [provider]  sacubitril -valsartan  (ENTRESTO ) 24-26 MG Take 1 tablet by mouth 2 (two) times daily.  02/12/24   [name], MD  spironolactone  (ALDACTONE ) 25 MG tablet Take 1 tablet (25 mg total) by mouth daily. 12/27/22   Debbie Lye, PA-C    Physical Exam: Vitals:   03/06/24 0544 03/06/24 0745 03/06/24 0800  BP: 101/78  (!) 117/94  Pulse: 76  (!) 143  Resp: 20  18  Temp: 98 F (36.7 C)    TempSrc: Oral    SpO2: 97%  96%  Weight:  129.3 kg   Height:  5' 2 (1.575 m)    Physical Exam Vitals and nursing note reviewed.  Constitutional:      General: She is awake. She is not in acute distress.    Appearance: Normal appearance. She is morbidly obese. She is ill-appearing.  HENT:     Head: Normocephalic.     Nose: No rhinorrhea.     Mouth/Throat:     Mouth: Mucous membranes are moist.     Pharynx: No posterior oropharyngeal erythema.  Eyes:     General: No scleral icterus.    Pupils: Pupils are equal, round, and reactive to light.  Neck:     Vascular: No JVD.  Cardiovascular:     Rate and Rhythm: Tachycardia present. Rhythm irregular.     Heart sounds: S1 normal and S2 normal.  Pulmonary:     Effort: Pulmonary effort is normal.     Breath sounds: Rales present. No wheezing or rhonchi.  Abdominal:     General: Bowel sounds are normal. There is no distension.     Palpations: Abdomen is soft.     Tenderness: There is no abdominal tenderness. There is no guarding.  Musculoskeletal:     Cervical back: Neck supple.     Right lower leg: No edema.     Left lower leg: No edema.  Skin:    General: Skin is warm and dry.  Neurological:     General: No focal deficit present.     Mental Status: She is alert and oriented to person, place, and time.  Psychiatric:        Mood and Affect: Mood normal.        Behavior: Behavior normal. Behavior is cooperative.     Data Reviewed:  Results are pending, will review when available.  03/14/2023 echocardiogram report. IMPRESSIONS:   1. Left ventricular ejection fraction, by estimation, is 60 to 65%. The  left ventricle  has normal function. The left ventricle has no regional  wall motion abnormalities. Left ventricular diastolic parameters were  normal.   2. Right ventricular systolic function is normal. The right ventricular  size is normal.   3. The mitral valve is normal in structure. Trivial mitral valve  regurgitation. No evidence of mitral stenosis.   4. The aortic valve is normal in structure. Aortic valve regurgitation is  not visualized. No aortic stenosis is present.   5. The inferior vena cava is normal in size with greater than 50%  respiratory variability, suggesting right atrial pressure of 3 mmHg.   EKG: Vent. rate 148 BPM PR interval 85 ms QRS duration 115 ms QT/QTcB 322/506 ms P-R-T axes 90 53 -56 Atrial flutter Nonspecific intraventricular conduction delay Nonspecific T abnormalities, diffuse leads Baseline wander in lead(s) II III aVF V3 V6.  Assessment and Plan: Principal Problem:   Paroxysmal atrial flutter (HCC) CHA?DS?-VASc Score of 4. Observation/stepdown. Continue DOAC. Discontinue heparin  infusion. Continue diltiazem  infusion. Started on beta-blocker twice daily. Keep electrolytes optimized. Check echocardiogram. Cardiology consult appreciated.  Active Problems:   Elevated troponin Secondary to demand ischemia.    Hypothyroidism Continue levothyroxine  75 mcg p.o. daily.    Pulmonary infiltrate  No cough or dyspnea. However has leukocytosis. Will continue antibiotics for now.    Leukocytosis No fever or signs of active infection. Check WBC in the morning.    Class 3 obesity  Current BMI 51.07 kg/m. Would benefit from lifestyle modifications. Follow-up closely with PCP and/or bariatric clinic.    DM (diabetes mellitus), type 2 (HCC) Carbohydrate modified diet. Continue 70/30 insulin  8 units twice daily. Will be holding Jardiance . CBG monitoring with RI SS. Check hemoglobin A1c.    Essential hypertension Started on metoprolol . Continue  Entresto .    Observed sleep apnea CPAP at bedtime.    Mixed hyperlipidemia Might benefit from statin therapy. Follow-up with primary care provider.    Advance Care Planning:   Code Status: Full Code   Consults: Cardiology consult.  Family Communication:   Severity of Illness: The appropriate patient  status for this patient is INPATIENT. Inpatient status is judged to be reasonable and necessary in order to provide the required intensity of service to ensure the patient's safety. The patient's presenting symptoms, physical exam findings, and initial radiographic and laboratory data in the context of their chronic comorbidities is felt to place them at high risk for further clinical deterioration. Furthermore, it is not anticipated that the patient will be medically stable for discharge from the hospital within 2 midnights of admission.   * I certify that at the point of admission it is my clinical judgment that the patient will require inpatient hospital care spanning beyond 2 midnights from the point of admission due to high intensity of service, high risk for further deterioration and high frequency of surveillance required.*  Author: Danice Dural, MD 03/06/2024 9:19 AM  For on call review [email or web address].   This document was prepared using Dragon voice recognition software and may contain some unintended transcription errors.

## 2024-03-06 NOTE — Progress Notes (Signed)
 PHARMACY - ANTICOAGULATION CONSULT NOTE  Pharmacy Consult for IV heparin  Indication: chest pain/ACS  Allergies  Allergen Reactions   Codeine Other (See Comments)    Hallucinations. Narcotics cause vomiting.   Sulfa Antibiotics Other (See Comments)    lifelong allergy    Patient Measurements: Height: 5' 2 (157.5 cm) Weight: 129.3 kg (285 lb) IBW/kg (Calculated) : 50.1 HEPARIN  DW (KG): 82.6  Vital Signs: Temp: 98 F (36.7 C) (06/11 0544) Temp Source: Oral (06/11 0544) BP: 101/78 (06/11 0544) Pulse Rate: 76 (06/11 0544)  Labs: Recent Labs    03/05/24 1722 03/05/24 1739 03/06/24 0605  HGB 15.1*  --  14.6  HCT 48.9*  --  47.2*  PLT 321  --  316  CREATININE 1.06*  --  1.00  TROPONINIHS  --  <2 363*    Estimated Creatinine Clearance: 85.9 mL/min (by C-G formula based on SCr of 1 mg/dL).   Medical History: Past Medical History:  Diagnosis Date   Anemia    Arthritis    Depression    Hypertension    Panic attack     Medications:  (Not in a hospital admission)  Scheduled:   heparin   2,500 Units Intravenous Once   PRN:   Assessment: 35 yoF with PMH Aflutter on Eliquis  at home, DM2, obesity, chronic diarrhea over past few months, with presentation to ED last night for near syncope after n/v/d, presents again to ED this AM with palpitations/rapid HR, chest tightness, and shortness of breath. Troponins elevated this AM (WNL last night), and Pharmacy consulted to dose IV heparin  for ACS.  Baseline aPTT, heparin  level: ordered Prior anticoagulation: Eliquis  5 mg PO bid, last dose 6/10 at 21:30  Significant events:  Today, 03/06/2024: CBC: WNL SCr stable WNL (baseline ~1.0) No bleeding or infusion issues per nursing  Goal of Therapy: Heparin  level 0.3-0.7 units/ml Monitor platelets by anticoagulation protocol: Yes  Plan: Heparin  2500 units IV bolus x 1 (will do half-bolus given <12 hr from last Eliquis  dose) Heparin  1000 units/hr IV infusion Check  heparin  level 6-8 hrs after start Daily CBC, daily heparin  level once stable Monitor for signs of bleeding or thrombosis  Tera Fellows, PharmD, BCPS [phone] 03/06/2024, 8:02 AM

## 2024-03-07 ENCOUNTER — Encounter (HOSPITAL_COMMUNITY): Payer: Self-pay | Admitting: Certified Registered Nurse Anesthetist

## 2024-03-07 ENCOUNTER — Inpatient Hospital Stay (HOSPITAL_COMMUNITY)

## 2024-03-07 DIAGNOSIS — R918 Other nonspecific abnormal finding of lung field: Secondary | ICD-10-CM

## 2024-03-07 DIAGNOSIS — I4892 Unspecified atrial flutter: Secondary | ICD-10-CM | POA: Diagnosis not present

## 2024-03-07 DIAGNOSIS — I5032 Chronic diastolic (congestive) heart failure: Secondary | ICD-10-CM

## 2024-03-07 DIAGNOSIS — R7989 Other specified abnormal findings of blood chemistry: Secondary | ICD-10-CM | POA: Diagnosis not present

## 2024-03-07 DIAGNOSIS — I10 Essential (primary) hypertension: Secondary | ICD-10-CM | POA: Diagnosis not present

## 2024-03-07 DIAGNOSIS — E119 Type 2 diabetes mellitus without complications: Secondary | ICD-10-CM

## 2024-03-07 DIAGNOSIS — G4730 Sleep apnea, unspecified: Secondary | ICD-10-CM

## 2024-03-07 DIAGNOSIS — E782 Mixed hyperlipidemia: Secondary | ICD-10-CM

## 2024-03-07 DIAGNOSIS — E66813 Obesity, class 3: Secondary | ICD-10-CM

## 2024-03-07 DIAGNOSIS — D72829 Elevated white blood cell count, unspecified: Secondary | ICD-10-CM

## 2024-03-07 DIAGNOSIS — G4733 Obstructive sleep apnea (adult) (pediatric): Secondary | ICD-10-CM | POA: Diagnosis not present

## 2024-03-07 LAB — CBC
HCT: 40.6 % (ref 36.0–46.0)
Hemoglobin: 11.9 g/dL — ABNORMAL LOW (ref 12.0–15.0)
MCH: 27.7 pg (ref 26.0–34.0)
MCHC: 29.3 g/dL — ABNORMAL LOW (ref 30.0–36.0)
MCV: 94.4 fL (ref 80.0–100.0)
Platelets: 239 10*3/uL (ref 150–400)
RBC: 4.3 MIL/uL (ref 3.87–5.11)
RDW: 14.8 % (ref 11.5–15.5)
WBC: 10.6 10*3/uL — ABNORMAL HIGH (ref 4.0–10.5)
nRBC: 0 % (ref 0.0–0.2)

## 2024-03-07 LAB — ECHOCARDIOGRAM COMPLETE
AR max vel: 1.84 cm2
AV Area VTI: 1.88 cm2
AV Area mean vel: 1.84 cm2
AV Mean grad: 6 mmHg
AV Peak grad: 11.8 mmHg
Ao pk vel: 1.72 m/s
Area-P 1/2: 3.42 cm2
Height: 62 in
S' Lateral: 2.8 cm
Weight: 4511.49 [oz_av]

## 2024-03-07 LAB — COMPREHENSIVE METABOLIC PANEL WITH GFR
ALT: 16 U/L (ref 0–44)
AST: 18 U/L (ref 15–41)
Albumin: 3.2 g/dL — ABNORMAL LOW (ref 3.5–5.0)
Alkaline Phosphatase: 48 U/L (ref 38–126)
Anion gap: 8 (ref 5–15)
BUN: 21 mg/dL — ABNORMAL HIGH (ref 6–20)
CO2: 23 mmol/L (ref 22–32)
Calcium: 8.4 mg/dL — ABNORMAL LOW (ref 8.9–10.3)
Chloride: 108 mmol/L (ref 98–111)
Creatinine, Ser: 0.97 mg/dL (ref 0.44–1.00)
GFR, Estimated: >60 mL/min (ref >60)
Glucose, Bld: 100 mg/dL — ABNORMAL HIGH (ref 70–99)
Potassium: 4 mmol/L (ref 3.5–5.1)
Sodium: 139 mmol/L (ref 135–145)
Total Bilirubin: 0.9 mg/dL (ref 0.0–1.2)
Total Protein: 6.6 g/dL (ref 6.5–8.1)

## 2024-03-07 LAB — HIV ANTIBODY (ROUTINE TESTING W REFLEX): HIV Screen 4th Generation wRfx: NONREACTIVE

## 2024-03-07 LAB — GLUCOSE, CAPILLARY
Glucose-Capillary: 109 mg/dL — ABNORMAL HIGH (ref 70–99)
Glucose-Capillary: 125 mg/dL — ABNORMAL HIGH (ref 70–99)

## 2024-03-07 LAB — HEMOGLOBIN A1C
Hgb A1c MFr Bld: 5.8 % — ABNORMAL HIGH (ref 4.8–5.6)
Mean Plasma Glucose: 120 mg/dL

## 2024-03-07 SURGERY — CARDIOVERSION (CATH LAB)
Anesthesia: General

## 2024-03-07 MED ORDER — AMOXICILLIN-POT CLAVULANATE 875-125 MG PO TABS: 1.0000 | ORAL_TABLET | Freq: Two times a day (BID) | ORAL | Status: DC

## 2024-03-07 MED ORDER — ONDANSETRON HCL 4 MG PO TABS: 4.0000 mg | ORAL_TABLET | Freq: Four times a day (QID) | ORAL | 0 refills | Status: AC | PRN

## 2024-03-07 MED ORDER — METOPROLOL SUCCINATE ER 50 MG PO TB24: 50.0000 mg | ORAL_TABLET | Freq: Every day | ORAL | 0 refills | Status: DC

## 2024-03-07 MED ORDER — METOPROLOL SUCCINATE ER 25 MG PO TB24: 100.0000 mg | ORAL_TABLET | Freq: Every day | ORAL | Status: DC

## 2024-03-07 MED ORDER — EMPAGLIFLOZIN 10 MG PO TABS
10.0000 mg | ORAL_TABLET | Freq: Every day | ORAL | Status: DC
Start: 2024-03-07 — End: 2024-03-07
  Filled 2024-03-07: qty 1

## 2024-03-07 MED ORDER — METOPROLOL SUCCINATE ER 25 MG PO TB24: 50.0000 mg | ORAL_TABLET | Freq: Every day | ORAL | Status: DC

## 2024-03-07 MED ORDER — AMOXICILLIN-POT CLAVULANATE 875-125 MG PO TABS: 1.0000 | ORAL_TABLET | Freq: Two times a day (BID) | ORAL | 0 refills | Status: AC

## 2024-03-07 NOTE — Anesthesia Preprocedure Evaluation (Signed)
 Anesthesia Evaluation    Reviewed: Allergy & Precautions, Patient's Chart, lab work & pertinent test results  History of Anesthesia Complications Negative for: history of anesthetic complications  Airway        Dental   Pulmonary sleep apnea , former smoker          Cardiovascular hypertension, Pt. on medications and Pt. on home beta blockers + dysrhythmias Atrial Fibrillation    '24 TTE - EF 60 to 65%. Trivial mitral valve regurgitation.     Neuro/Psych  PSYCHIATRIC DISORDERS Anxiety Depression    negative neurological ROS     GI/Hepatic Neg liver ROS,GERD  Medicated,,  Endo/Other  diabetes, Type 2, Insulin  Dependent, Oral Hypoglycemic AgentsHypothyroidism  Class 4 obesity  Renal/GU negative Renal ROS     Musculoskeletal  (+) Arthritis ,    Abdominal   Peds  Hematology  On eliquis     Anesthesia Other Findings   Reproductive/Obstetrics                              Anesthesia Physical Anesthesia Plan  ASA: 3  Anesthesia Plan: General   Post-op Pain Management: Minimal or no pain anticipated   Induction: Intravenous  PONV Risk Score and Plan: 3 and Treatment may vary due to age or medical condition and Propofol  infusion  Airway Management Planned: Natural Airway and Mask  Additional Equipment: None  Intra-op Plan:   Post-operative Plan:   Informed Consent:   Plan Discussed with: CRNA and Anesthesiologist  Anesthesia Plan Comments:          Anesthesia Quick Evaluation

## 2024-03-07 NOTE — Progress Notes (Signed)
 Progress Note  Patient Name: Debbie Lee Date of Encounter: 03/07/2024 [facility] Cardiologist: Janelle Mediate, MD   Interval Summary   Reports that she feels better than yesterday.  She feels a chest pressure when she is in atrial flutter.  Vital Signs Vitals:   03/07/24 0800 03/07/24 0900 03/07/24 1000 03/07/24 1100  BP:      Pulse: 86 71 68 68  Resp: (!) 24 18 18 16   Temp: 98.2 F (36.8 C)     TempSrc: Oral     SpO2: 98% 97% 99% 99%  Weight:      Height:        Intake/Output Summary (Last 24 hours) at 03/07/2024 1211 Last data filed at 03/07/2024 0255 Gross per 24 hour  Intake 530.36 ml  Output --  Net 530.36 ml      03/06/2024   12:17 PM 03/06/2024    7:45 AM 01/21/2024    6:24 PM  Last 3 Weights  Weight (lbs) 281 lb 15.5 oz 285 lb 284 lb  Weight (kg) 127.9 kg 129.275 kg 128.822 kg      Telemetry/ECG  Converted to normal sinus rhythm at about 1919 last night.  Resting heart rates are in the 60s - Personally Reviewed  Physical Exam  GEN: No acute distress.   Neck: No JVD Cardiac: RRR, no murmurs, rubs, or gallops.  Respiratory: Clear to auscultation bilaterally. GI: Soft, nontender, non-distended  MS: No edema  Assessment & Plan  #Atrial flutter with RVR - She has a history of atrial fibrillation/flutter and is followed at [facility] - She had been on amiodarone  for a while but that was discontinued due to her young age - Recently has been having increased frequency of episodes of atrial flutter and has had 2 cardioversions - Echocardiogram this hospitalization showed normal LVEF of 60 to 65%, no regional wall motion abnormalities, normal diastolic function, and valve function is grossly normal. - Seeing EP with plans for a flutter ablation in the near future. - Patient was in atrial flutter and converted to sinus rhythm at 7 PM last night.  She has been feeling better since she converted.  IV Cardizem  was stopped. - Cancel DCCV this a.m. because  the patient spontaneously converted overnight. - TSH, K+ and Mag are normal  - Continue Eliquis  5 mg twice daily - Continue Lopressor  25 mg twice daily for heart rate control.  Blood pressures continue to remain low at times - follow up with [facility] in 2-4 weeks    #Chronic diastolic CHF  #Hypertension - BP controlled - Her most recent 2D echo that we have is from 03/14/2023 showing EF 60 to 65% with normal RV function and no significant valvular disease and normal LA size.  Right atrium was enlarged.  Echocardiogram this hospitalization showed normal LVEF of 60 to 65%, no regional wall motion abnormalities, normal diastolic function, and valve function is grossly normal.  Right atrium was normal in size - She does not appear volume overloaded on exam.  - GDMT: PTA she was on carvedilol  12.5 mg twice daily, Entresto  24-26 mg twice daily, Jardiance  25 mg daily, spironolactone  25 mg daily and Lasix  20 mg daily as needed for edema - Carvedilol  was stopped in favor of Lopressor  for better heart rate control - Continue Entresto  24-26 mg twice daily - Continue spironolactone  25 mg daily - Start Jardiance  10 mg daily as we are no longer doing the DCCV. - Follow strict I's and O's, daily weights  and renal function   #Obstructive sleep apnea -Continue CPAP therapy -Would recommend she get in with a sleep medicine physician to make sure that her OSA is adequately treated on current settings and there is no nocturnal hypoxemia that could trigger further arrhythmias especially since this last episode occurred at night   Other conditions managed per primary  CHA2DS2-VASc Score = 4   This indicates a 4.8% annual risk of stroke. The patient's score is based upon: CHF History: 1 HTN History: 1 Diabetes History: 1 Stroke History: 0 Vascular Disease History: 0 Age Score: 0 Gender Score: 1   For questions or updates, please contact [facility] Please consult [email or web address] for contact info  under       Signed, Jeniel Slauson, PA-C

## 2024-03-07 NOTE — Progress Notes (Signed)
   03/07/24 1010  TOC Brief Assessment  Insurance and Status Reviewed  Patient has primary care physician Yes  Home environment has been reviewed home w/ roommates  Prior level of function: independent  Prior/Current Home Services No current home services  Social Drivers of Health Review SDOH reviewed no interventions necessary  Readmission risk has been reviewed Yes  Transition of care needs no transition of care needs at this time

## 2024-03-07 NOTE — Progress Notes (Signed)
 SATURATION QUALIFICATIONS: (This note is used to comply with regulatory documentation for home oxygen)  Patient Saturations on Room Air at Rest = 97%  Patient Saturations on Room Air while Ambulating = 95%  Patient Saturations on 0 Liters of oxygen while Ambulating = 95%  Please briefly explain why patient needs home oxygen: PT does not require at home oxygen at this time.

## 2024-03-07 NOTE — Discharge Summary (Signed)
 Physician Discharge Summary   Patient: Debbie Lee MRN: [id number] DOB: July 28, 1971  Admit date:     03/06/2024  Discharge date: 03/07/24  Discharge Physician: Aura Leeds, DO   PCP: Virgina Grills, FNP   Recommendations at discharge:   Follow-up with PCP within 1 to 2 weeks repeat CBC, CMP, mag, Phos within 1 week Follow-up with cardiology in outpatient setting and have outpatient evaluation with the EP  Discharge Diagnoses: Principal Problem:   Paroxysmal atrial flutter (HCC) Active Problems:   DM (diabetes mellitus), type 2 (HCC)   Essential hypertension   Observed sleep apnea   Mixed hyperlipidemia   Elevated troponin   Hypothyroidism   Leukocytosis   Class 3 obesity   Pulmonary infiltrate   Chronic diastolic CHF (congestive heart failure) (HCC)  Resolved Problems:   * No resolved hospital problems. *  Hospital Course: Patient is a 53 year old morbidly obese Caucasian female past medical history significant for IDA, arthritis, anxiety, chronic insomnia, panic attacks and depression, chronic back pain, hypertension, mixed hyperlipidemia, diabetes mellitus type 2, sleep apnea, vitamin D deficiency, history of atrial fibrillation presented the ED with palpitations associate with cough.  Cough is nonproductive and further workup was done and she is found to be in atrial flutter with RVR.  She was placed on a diltiazem  drip and cardiology was consulted.  Cardiology was planning on a TEE/DCCV cardioversion for atrial flutter with RVR but she spontaneously converted.  She has been having increased frequency of episodes of atrial flutter and has had 2 cardioversions already.  Echo this time was done and showed an EF of 60 to 65% with normal diastolic dysfunction.  Cardiology canceled the cardioversion and recommended metoprolol  succinate 50 mg p.o. daily and apixaban  5 mg p.o. twice daily and following outpatient with EP was advised.  She is medically stable for discharge given that she  is improved and ambulatory home O2 screen done and she did not desaturate.  Assessment and Plan:   Paroxysmal atrial flutter (HCC) w/ RVR: CHA?DS?-VASc Score of 4. Observation/stepdown. Heparin  infusion stopped and changed back to DOAC Given spontaenous Conversion. Was on diltiazem  infusion but stopped. Started on beta-blocker twice daily and changed to Metoprolol  Succinate. Keep electrolytes optimized. Check echocardiogram showed an EF of 60 to 65% with normal diastolic function with normal right ventricular and trivial mitral regurg with small possible PFO Cardiology consult appreciated and plan was for initial cardioversion but she spontaneously converted.  She is doing better and back to her baseline.  Cardiology recommends outpatient follow-up with EP for ablation   Elevated troponin: Secondary to demand ischemia.  Cardiology consulted and echo done.  They are recommending continue Entresto , Jardiance , spironolactone  metoprolol  succinate   Hypothyroidism: Continue levothyroxine  75 mcg p.o. daily.   Pulmonary infiltrate: No cough or dyspnea. However has leukocytosis. Will continue antibiotics for now and continue discharge   Leukocytosis, improving: No fever or signs of active infection. Check WBC in outpatient setting    DM (diabetes mellitus), type 2 (HCC) Carbohydrate modified diet. Continue 70/30 insulin  8 units twice daily. Will be holding Jardiance . CBG monitoring with RI SS. Check hemoglobin A1c.   Essential hypertension Started on metoprolol  and consolidated to succinate Continue Entresto .   Observed sleep apnea CPAP at bedtime.     Mixed hyperlipidemia Might benefit from statin therapy. Follow-up with primary care provider.  Metabolic Acidosis: Mild as CO2 is 21, AG is 8, Chloride Level is 108. CTM and Trend and repeat CMP in the AM  Normocytic Anemia: Check Anemia Panel in the outpt setting. Hgb/Hct went from 14.6/47.2 -> 11.9/40.6. Repeat CBC w/in 1  week  Hypoalbuminemia: Patient's Albumin Level went from 3.8 -> 3.7 -> 3.2. CTM and Trend and repeat CMP in the AM  Class III (Super Morbid) Obesity: Complicates overall prognosis and care. Estimated body mass index is 51.57 kg/m as calculated from the following:   Height as of this encounter: 5' 2 (1.575 m).   Weight as of this encounter: 127.9 kg. Weight Loss and Dietary Counseling given  Consultants: Cardiology Procedures performed: ECHO   Disposition: Home Diet recommendation:  Discharge Diet Orders (From admission, onward)     Start     Ordered   03/07/24 0000  Diet - low sodium heart healthy        03/07/24 1336   03/07/24 0000  Diet Carb Modified        03/07/24 1336           Cardiac and Carb modified diet DISCHARGE MEDICATION: Allergies as of 03/07/2024       Reactions   Codeine Other (See Comments)   Hallucinations. Narcotics cause vomiting.   Sulfa Antibiotics Other (See Comments)   lifelong allergy        Medication List     STOP taking these medications    carvedilol  12.5 MG tablet Commonly known as: COREG        TAKE these medications    albuterol  108 (90 Base) MCG/ACT inhaler Commonly known as: VENTOLIN  HFA Inhale 2 puffs into the lungs every 6 (six) hours as needed for wheezing or shortness of breath.   amoxicillin -clavulanate 875-125 MG tablet Commonly known as: AUGMENTIN  Take 1 tablet by mouth every 12 (twelve) hours for 7 days.   apixaban  5 MG Tabs tablet Commonly known as: ELIQUIS  Take 1 tablet (5 mg total) by mouth 2 (two) times daily.   Entresto  24-26 MG Generic drug: sacubitril -valsartan  Take 1 tablet by mouth 2 (two) times daily.   furosemide  20 MG tablet Commonly known as: LASIX  TAKE 1 TABLET AS NEEDED FOR EDEMA OR FLUID (WEIGHT GAIN OF 2 LBS IN A DAY OR 5 LBS IN A WEEK).   insulin  aspart 100 UNIT/ML FlexPen Commonly known as: NOVOLOG  CBG 70 - 120: 0 units  CBG 121 - 150: 1 unit  CBG 151 - 200: 2 units  CBG 201  - 250: 3 units  CBG 251 - 300: 5 units  CBG 301 - 350: 7 units  CBG 351 - 400: 9 units What changed:  how much to take how to take this when to take this reasons to take this   Jardiance  25 MG Tabs tablet Generic drug: empagliflozin  Take 25 mg by mouth daily.   levothyroxine  75 MCG tablet Commonly known as: SYNTHROID  Take 75 mcg by mouth at bedtime.   loperamide  2 MG tablet Commonly known as: IMODIUM  A-D Take 2 mg by mouth 4 (four) times daily as needed for diarrhea or loose stools.   metFORMIN  500 MG tablet Commonly known as: GLUCOPHAGE  Take 500 mg by mouth 2 (two) times daily with a meal.   metoprolol  succinate 50 MG 24 hr tablet Commonly known as: TOPROL -XL Take 1 tablet (50 mg total) by mouth daily.   NovoLIN  70/30 Kwikpen (70-30) 100 UNIT/ML KwikPen Generic drug: insulin  isophane & regular human KwikPen Inject 15 Units into the skin 2 (two) times daily. What changed: how much to take   omeprazole 20 MG capsule Commonly known as: PRILOSEC Take 20  mg by mouth daily as needed (indigestion).   ondansetron  4 MG tablet Commonly known as: ZOFRAN  Take 1 tablet (4 mg total) by mouth every 6 (six) hours as needed for nausea.   spironolactone  25 MG tablet Commonly known as: ALDACTONE  Take 1 tablet (25 mg total) by mouth daily.   Vitamin D (Ergocalciferol) 1.25 MG (50000 UNIT) Caps capsule Commonly known as: DRISDOL Take 50,000 Units by mouth once a week.        Discharge Exam: Filed Weights   03/06/24 0745 03/06/24 1217  Weight: 129.3 kg 127.9 kg   Vitals:   03/07/24 1221 03/07/24 1300  BP: (!) 141/68   Pulse: 68 67  Resp: 20 (!) 22  Temp:    SpO2: 95% 97%   Examination: Physical Exam:  Constitutional: WN/WD morbidly obese Caucasian female in no acute distress Respiratory: Slightly diminished to auscultation bilaterally, no wheezing, rales, rhonchi or crackles. Normal respiratory effort and patient is not tachypenic. No accessory muscle use.   Unlabored breathing Cardiovascular: RRR, no murmurs / rubs / gallops. S1 and S2 auscultated. No extremity edema.  Abdomen: Soft, non-tender, distended secondary to body habitus. Bowel sounds positive.  GU: Deferred. Musculoskeletal: No clubbing / cyanosis of digits/nails. No joint deformity upper and lower extremities.  Skin: No rashes, lesions, ulcers on limited skin evaluation. No induration; Warm and dry.  Neurologic: CN 2-12 grossly intact with no focal deficits. Romberg sign and cerebellar reflexes not assessed.  Psychiatric: Normal judgment and insight. Alert and oriented x 3. Normal mood and appropriate affect.   Condition at discharge: stable  The results of significant diagnostics from this hospitalization (including imaging, microbiology, ancillary and laboratory) are listed below for reference.   Imaging Studies: ECHOCARDIOGRAM COMPLETE Result Date: 03/07/2024    ECHOCARDIOGRAM REPORT   Patient Name:   JENILEE FRANEY Date of Exam: 03/07/2024 Medical Rec #:  [id number]   Height:       62.0 in Accession #:    [id number]  Weight:       282.0 lb Date of Birth:  Jun 26, 1971   BSA:          2.212 m Patient Age:    53 years    BP:           148/69 mmHg Patient Gender: F           HR:           64 bpm. Exam Location:  Inpatient Procedure: 2D Echo, Cardiac Doppler and Color Doppler (Both Spectral and Color            Flow Doppler were utilized during procedure). Indications:    Elevated Troponin  History:        Patient has prior history of Echocardiogram examinations, most                 recent 11/14/2022. Risk Factors:Hypertension, Diabetes and Sleep                 Apnea.  Sonographer:    Janette Medley Referring Phys: [id number] DAVID MANUEL ORTIZ  Sonographer Comments: Patient is obese. IMPRESSIONS  1. Left ventricular ejection fraction, by estimation, is 60 to 65%. The left ventricle has normal function. The left ventricle has no regional wall motion abnormalities. Left ventricular diastolic parameters  were normal.  2. Right ventricular systolic function is normal. The right ventricular size is normal. There is normal pulmonary artery systolic pressure.  3. The mitral valve is normal in structure. Trivial mitral  valve regurgitation. No evidence of mitral stenosis.  4. The aortic valve is grossly normal. There is mild calcification of the aortic valve. Aortic valve regurgitation is not visualized. Aortic valve sclerosis/calcification is present, without any evidence of aortic stenosis.  5. The inferior vena cava is normal in size with greater than 50% respiratory variability, suggesting right atrial pressure of 3 mmHg.  6. Cannot exclude a small PFO. Comparison(s): No significant change from prior study. Conclusion(s)/Recommendation(s): Normal biventricular function without evidence of hemodynamically significant valvular heart disease. FINDINGS  Left Ventricle: Left ventricular ejection fraction, by estimation, is 60 to 65%. The left ventricle has normal function. The left ventricle has no regional wall motion abnormalities. The left ventricular internal cavity size was normal in size. There is  borderline left ventricular hypertrophy. Left ventricular diastolic parameters were normal. Right Ventricle: The right ventricular size is normal. No increase in right ventricular wall thickness. Right ventricular systolic function is normal. There is normal pulmonary artery systolic pressure. The tricuspid regurgitant velocity is 2.00 m/s, and  with an assumed right atrial pressure of 3 mmHg, the estimated right ventricular systolic pressure is 19.0 mmHg. Left Atrium: Left atrial size was normal in size. Right Atrium: Right atrial size was normal in size. Pericardium: There is no evidence of pericardial effusion. Mitral Valve: The mitral valve is normal in structure. Trivial mitral valve regurgitation. No evidence of mitral valve stenosis. Tricuspid Valve: The tricuspid valve is grossly normal. Tricuspid valve  regurgitation is trivial. No evidence of tricuspid stenosis. Aortic Valve: The aortic valve is grossly normal. There is mild calcification of the aortic valve. Aortic valve regurgitation is not visualized. Aortic valve sclerosis/calcification is present, without any evidence of aortic stenosis. Aortic valve mean gradient measures 6.0 mmHg. Aortic valve peak gradient measures 11.8 mmHg. Aortic valve area, by VTI measures 1.88 cm. Pulmonic Valve: The pulmonic valve was not well visualized. Pulmonic valve regurgitation is not visualized. No evidence of pulmonic stenosis. Aorta: The aortic root and ascending aorta are structurally normal, with no evidence of dilitation. Venous: The inferior vena cava is normal in size with greater than 50% respiratory variability, suggesting right atrial pressure of 3 mmHg. IAS/Shunts: Cannot exclude a small PFO.  LEFT VENTRICLE PLAX 2D LVIDd:         4.20 cm   Diastology LVIDs:         2.80 cm   LV e' medial:    8.81 cm/s LV PW:         1.00 cm   LV E/e' medial:  13.4 LV IVS:        1.20 cm   LV e' lateral:   11.20 cm/s LVOT diam:     2.00 cm   LV E/e' lateral: 10.5 LV SV:         70 LV SV Index:   32 LVOT Area:     3.14 cm  RIGHT VENTRICLE             IVC RV S prime:     12.80 cm/s  IVC diam: 2.10 cm TAPSE (M-mode): 2.0 cm LEFT ATRIUM             Index        RIGHT ATRIUM           Index LA diam:        3.70 cm 1.67 cm/m   RA Area:     15.40 cm LA Vol (A2C):   61.6 ml 27.85 ml/m  RA Volume:  36.00 ml  16.27 ml/m LA Vol (A4C):   45.8 ml 20.70 ml/m LA Biplane Vol: 54.6 ml 24.68 ml/m  AORTIC VALVE AV Area (Vmax):    1.84 cm AV Area (Vmean):   1.84 cm AV Area (VTI):     1.88 cm AV Vmax:           172.00 cm/s AV Vmean:          117.000 cm/s AV VTI:            0.375 m AV Peak Grad:      11.8 mmHg AV Mean Grad:      6.0 mmHg LVOT Vmax:         101.00 cm/s LVOT Vmean:        68.600 cm/s LVOT VTI:          0.224 m LVOT/AV VTI ratio: 0.60  AORTA Ao Root diam: 2.50 cm Ao Asc diam:   3.00 cm MITRAL VALVE                TRICUSPID VALVE MV Area (PHT): 3.42 cm     TR Peak grad:   16.0 mmHg MV Decel Time: 222 msec     TR Vmax:        200.00 cm/s MV E velocity: 118.00 cm/s MV A velocity: 94.70 cm/s   SHUNTS MV E/A ratio:  1.25         Systemic VTI:  0.22 m                             Systemic Diam: 2.00 cm Sheryle Donning MD Electronically signed by Sheryle Donning MD Signature Date/Time: 03/07/2024/12:14:28 PM    Final    DG Chest 2 View Result Date: 03/06/2024 CLINICAL DATA:  53 year old female with palpitations, chest pain. EXAM: CHEST - 2 VIEW COMPARISON:  Chest radiographs yesterday and earlier. FINDINGS: Semi upright AP and lateral views of the chest at 0628 hours. Lung volumes and mediastinal contours remain normal. Visualized tracheal air column is within normal limits. No pneumothorax, pulmonary edema, pleural effusion or consolidation. However, there is streaky new right infrahilar opacity on the AP view, which on the lateral appears likely to be peribronchial opacity in the lower lobe. Lung markings elsewhere appears stable and negative. No acute osseous abnormality identified. Paucity of bowel gas in the upper abdomen. IMPRESSION: New right lower lobe peribronchial opacity since yesterday is nonspecific but might reflect bronchopneumonia. Query signs/symptoms of infection. No pleural effusion. Electronically Signed   By: Marlise Simpers M.D.   On: 03/06/2024 06:42   DG Chest 2 View Result Date: 03/05/2024 CLINICAL DATA:  Near syncope EXAM: CHEST - 2 VIEW COMPARISON:  01/21/2024 FINDINGS: The heart size and mediastinal contours are within normal limits. Both lungs are clear. The visualized skeletal structures are unremarkable. IMPRESSION: No active cardiopulmonary disease. Electronically Signed   By: Janeece Mechanic M.D.   On: 03/05/2024 18:07   Microbiology: Results for orders placed or performed during the hospital encounter of 03/06/24  MRSA Next Gen by PCR, Nasal     Status:  None   Collection Time: 03/06/24 12:16 PM   Specimen: Nasal Mucosa; Nasal Swab  Result Value Ref Range Status   MRSA by PCR Next Gen NOT DETECTED NOT DETECTED Final    Comment: (NOTE) The GeneXpert MRSA Assay (FDA approved for NASAL specimens only), is one component of a comprehensive MRSA colonization surveillance program. It is not intended  to diagnose MRSA infection nor to guide or monitor treatment for MRSA infections. Test performance is not FDA approved in patients less than 71 years old. Performed at [facility], [address] [place], [place] [address]    Labs: CBC: Recent Labs  Lab 03/05/24 1722 03/06/24 0605 03/07/24 0333  WBC 14.8* 14.4* 10.6*  HGB 15.1* 14.6 11.9*  HCT 48.9* 47.2* 40.6  MCV 90.2 91.1 94.4  PLT 321 316 239   Basic Metabolic Panel: Recent Labs  Lab 03/05/24 1722 03/06/24 0605 03/06/24 0815 03/07/24 0333  NA 140 139  --  139  K 4.4 4.4  --  4.0  CL 104 105  --  108  CO2 22 23  --  23  GLUCOSE 113* 125*  --  100*  BUN 16 13  --  21*  CREATININE 1.06* 1.00  --  0.97  CALCIUM 9.0 8.8*  --  8.4*  MG  --   --  2.1  --    Liver Function Tests: Recent Labs  Lab 03/05/24 1722 03/06/24 0605 03/07/24 0333  AST 24 21 18   ALT 21 16 16   ALKPHOS 67 55 48  BILITOT 1.1 1.1 0.9  PROT 8.4* 7.9 6.6  ALBUMIN 3.8 3.7 3.2*   CBG: Recent Labs  Lab 03/06/24 1631 03/06/24 2118 03/07/24 0756 03/07/24 1220  GLUCAP 108* 125* 109* 125*   Discharge time spent: greater than 30 minutes.  Signed: Aura Leeds, DO [facility] 03/09/2024

## 2024-03-09 NOTE — Hospital Course (Signed)
 Patient is a 53 year old morbidly obese Caucasian female past medical history significant for IDA, arthritis, anxiety, chronic insomnia, panic attacks and depression, chronic back pain, hypertension, mixed hyperlipidemia, diabetes mellitus type 2, sleep apnea, vitamin D deficiency, history of atrial fibrillation presented the ED with palpitations associate with cough.  Cough is nonproductive and further workup was done and she is found to be in atrial flutter with RVR.  She was placed on a diltiazem  drip and cardiology was consulted.  Cardiology was planning on a TEE/DCCV cardioversion for atrial flutter with RVR but she spontaneously converted.  She has been having increased frequency of episodes of atrial flutter and has had 2 cardioversions already.  Echo this time was done and showed an EF of 60 to 65% with normal diastolic dysfunction.  Cardiology canceled the cardioversion and recommended metoprolol  succinate 50 mg p.o. daily and apixaban  5 mg p.o. twice daily and following outpatient with EP was advised.  She is medically stable for discharge given that she is improved and ambulatory home O2 screen done and she did not desaturate.  Assessment and Plan:   Paroxysmal atrial flutter (HCC) w/ RVR: CHA?DS?-VASc Score of 4. Observation/stepdown. Heparin  infusion stopped and changed back to DOAC Given spontaenous Conversion. Was on diltiazem  infusion but stopped. Started on beta-blocker twice daily and changed to Metoprolol  Succinate. Keep electrolytes optimized. Check echocardiogram showed an EF of 60 to 65% with normal diastolic function with normal right ventricular and trivial mitral regurg with small possible PFO Cardiology consult appreciated and plan was for initial cardioversion but she spontaneously converted.  She is doing better and back to her baseline.  Cardiology recommends outpatient follow-up with EP for ablation   Elevated troponin: Secondary to demand ischemia.  Cardiology consulted and  echo done.  They are recommending continue Entresto , Jardiance , spironolactone  metoprolol  succinate   Hypothyroidism: Continue levothyroxine  75 mcg p.o. daily.   Pulmonary infiltrate: No cough or dyspnea. However has leukocytosis. Will continue antibiotics for now and continue discharge   Leukocytosis, improving: No fever or signs of active infection. Check WBC in outpatient setting    DM (diabetes mellitus), type 2 (HCC) Carbohydrate modified diet. Continue 70/30 insulin  8 units twice daily. Will be holding Jardiance . CBG monitoring with RI SS. Check hemoglobin A1c.   Essential hypertension Started on metoprolol  and consolidated to succinate Continue Entresto .   Observed sleep apnea CPAP at bedtime.     Mixed hyperlipidemia Might benefit from statin therapy. Follow-up with primary care provider.  Metabolic Acidosis: Mild as CO2 is 21, AG is 8, Chloride Level is 108. CTM and Trend and repeat CMP in the AM  Normocytic Anemia: Check Anemia Panel in the outpt setting. Hgb/Hct went from 14.6/47.2 -> 11.9/40.6. Repeat CBC w/in 1 week  Hypoalbuminemia: Patient's Albumin Level went from 3.8 -> 3.7 -> 3.2. CTM and Trend and repeat CMP in the AM  Class III (Super Morbid) Obesity: Complicates overall prognosis and care. Estimated body mass index is 51.57 kg/m as calculated from the following:   Height as of this encounter: 5' 2 (1.575 m).   Weight as of this encounter: 127.9 kg. Weight Loss and Dietary Counseling given

## 2024-03-23 ENCOUNTER — Encounter (HOSPITAL_COMMUNITY): Payer: Self-pay | Admitting: Emergency Medicine

## 2024-03-23 ENCOUNTER — Emergency Department (HOSPITAL_COMMUNITY)
Admission: EM | Admit: 2024-03-23 | Discharge: 2024-03-24 | Disposition: A | Discharge status: Home / Self Care | Attending: Student | Admitting: Student

## 2024-03-23 DIAGNOSIS — Z7984 Long term (current) use of oral hypoglycemic drugs: Secondary | ICD-10-CM | POA: Diagnosis not present

## 2024-03-23 DIAGNOSIS — E119 Type 2 diabetes mellitus without complications: Secondary | ICD-10-CM | POA: Insufficient documentation

## 2024-03-23 DIAGNOSIS — I483 Typical atrial flutter: Secondary | ICD-10-CM | POA: Diagnosis not present

## 2024-03-23 DIAGNOSIS — I5032 Chronic diastolic (congestive) heart failure: Secondary | ICD-10-CM | POA: Diagnosis not present

## 2024-03-23 DIAGNOSIS — Z7901 Long term (current) use of anticoagulants: Secondary | ICD-10-CM | POA: Diagnosis not present

## 2024-03-23 DIAGNOSIS — E039 Hypothyroidism, unspecified: Secondary | ICD-10-CM | POA: Insufficient documentation

## 2024-03-23 DIAGNOSIS — Z87891 Personal history of nicotine dependence: Secondary | ICD-10-CM | POA: Insufficient documentation

## 2024-03-23 DIAGNOSIS — R002 Palpitations: Secondary | ICD-10-CM | POA: Diagnosis present

## 2024-03-23 DIAGNOSIS — Z794 Long term (current) use of insulin: Secondary | ICD-10-CM | POA: Insufficient documentation

## 2024-03-23 DIAGNOSIS — Z79899 Other long term (current) drug therapy: Secondary | ICD-10-CM | POA: Diagnosis not present

## 2024-03-23 DIAGNOSIS — I110 Hypertensive heart disease with heart failure: Secondary | ICD-10-CM | POA: Diagnosis not present

## 2024-03-23 LAB — COMPREHENSIVE METABOLIC PANEL WITH GFR
ALT: 20 U/L (ref 0–44)
AST: 19 U/L (ref 15–41)
Albumin: 3.6 g/dL (ref 3.5–5.0)
Alkaline Phosphatase: 76 U/L (ref 38–126)
Anion gap: 10 (ref 5–15)
BUN: 19 mg/dL (ref 6–20)
CO2: 21 mmol/L — ABNORMAL LOW (ref 22–32)
Calcium: 9 mg/dL (ref 8.9–10.3)
Chloride: 107 mmol/L (ref 98–111)
Creatinine, Ser: 0.93 mg/dL (ref 0.44–1.00)
GFR, Estimated: >60 mL/min (ref >60)
Glucose, Bld: 144 mg/dL — ABNORMAL HIGH (ref 70–99)
Potassium: 3.8 mmol/L (ref 3.5–5.1)
Sodium: 138 mmol/L (ref 135–145)
Total Bilirubin: 0.7 mg/dL (ref 0.0–1.2)
Total Protein: 7.8 g/dL (ref 6.5–8.1)

## 2024-03-23 LAB — CBC WITH DIFFERENTIAL/PLATELET
Abs Immature Granulocytes: 0.06 10*3/uL (ref 0.00–0.07)
Basophils Absolute: 0.1 10*3/uL (ref 0.0–0.1)
Basophils Relative: 0 %
Eosinophils Absolute: 0.1 10*3/uL (ref 0.0–0.5)
Eosinophils Relative: 1 %
HCT: 44.9 % (ref 36.0–46.0)
Hemoglobin: 14.1 g/dL (ref 12.0–15.0)
Immature Granulocytes: 0 %
Lymphocytes Relative: 20 %
Lymphs Abs: 3 10*3/uL (ref 0.7–4.0)
MCH: 27.9 pg (ref 26.0–34.0)
MCHC: 31.4 g/dL (ref 30.0–36.0)
MCV: 88.7 fL (ref 80.0–100.0)
Monocytes Absolute: 0.9 10*3/uL (ref 0.1–1.0)
Monocytes Relative: 6 %
Neutro Abs: 11.1 10*3/uL — ABNORMAL HIGH (ref 1.7–7.7)
Neutrophils Relative %: 73 %
Platelets: 334 10*3/uL (ref 150–400)
RBC: 5.06 MIL/uL (ref 3.87–5.11)
RDW: 14.6 % (ref 11.5–15.5)
WBC: 15.2 10*3/uL — ABNORMAL HIGH (ref 4.0–10.5)
nRBC: 0 % (ref 0.0–0.2)

## 2024-03-23 LAB — TROPONIN I (HIGH SENSITIVITY): Troponin I (High Sensitivity): 5 ng/L (ref <18)

## 2024-03-23 MED ORDER — LACTATED RINGERS IV BOLUS
1000.0000 mL | Freq: Once | INTRAVENOUS | Status: AC
  Administered 2024-03-23: 1000 mL via INTRAVENOUS

## 2024-03-23 MED ORDER — PROPOFOL 10 MG/ML IV BOLUS
1.0000 mg/kg | Freq: Once | INTRAVENOUS | Status: AC
  Administered 2024-03-23: 50 mg via INTRAVENOUS
  Filled 2024-03-23: qty 20

## 2024-03-23 MED ORDER — PHENYLEPHRINE 80 MCG/ML (10ML) SYRINGE FOR IV PUSH (FOR BLOOD PRESSURE SUPPORT)
80.0000 ug | PREFILLED_SYRINGE | Freq: Once | INTRAVENOUS | Status: AC | PRN
  Administered 2024-03-23: 160 ug via INTRAVENOUS
  Filled 2024-03-23: qty 10

## 2024-03-23 MED ORDER — DILTIAZEM HCL 25 MG/5ML IV SOLN
15.0000 mg | Freq: Once | INTRAVENOUS | Status: AC
  Administered 2024-03-23: 15 mg via INTRAVENOUS
  Filled 2024-03-23: qty 5

## 2024-03-23 MED ORDER — PROPOFOL 10 MG/ML IV BOLUS
INTRAVENOUS | Status: DC | PRN
  Administered 2024-03-23: 80 ug via INTRAVENOUS

## 2024-03-23 NOTE — Sedation Documentation (Signed)
 Shocked at [employer], converted to [facility]

## 2024-03-23 NOTE — ED Triage Notes (Addendum)
 Around 1pm felt heart racing but seemed to resolve. Around 7pm went up and stayed. Hx of A flutter. HR 152. She is alert and oriented and seems to be in no acute distress. Endorses chest tightness and SOB. Taking Eliquis  and is compliant

## 2024-03-23 NOTE — ED Provider Notes (Signed)
 [place] EMERGENCY DEPARTMENT AT [facility] Provider Note  CSN: [id number] Arrival date & time: 03/23/24 2134  Chief Complaint(s) Tachycardia  HPI Debbie Lee is a 53 y.o. female with PMH paroxysmal A-flutter with recent hospital discharge on 03/07/2024 after conversion on diltiazem  for a flutter who presents emergency room for evaluation of rapid tachycardia and palpitations.  Started to feel initial palpitations around 1 PM today that self resolved but returned at approximately 7 PM today.  Arrives with heart rate in the 150s with complaints of palpitations but denies chest pain, shortness of breath, abdominal pain, nausea, vomiting or other systemic symptoms.   Past Medical History Past Medical History:  Diagnosis Date   Anemia    Arthritis    Depression    Hypertension    Panic attack    Paroxysmal atrial flutter (HCC)    Followed at [facility] by Dr. Epifanio and is currently in workup for ablation   Patient Active Problem List   Diagnosis Date Noted   Chronic diastolic CHF (congestive heart failure) (HCC) 03/07/2024   Paroxysmal atrial flutter (HCC) 03/06/2024   Elevated troponin 03/06/2024   Hypothyroidism 03/06/2024   Leukocytosis 03/06/2024   Class 3 obesity 03/06/2024   Pulmonary infiltrate 03/06/2024   Atrial flutter (HCC) 11/13/2022   DM (diabetes mellitus), type 2 (HCC) 11/13/2022   Obesity, Class III, BMI 40-49.9 (morbid obesity) 11/13/2022   Anemia 11/13/2022   Elevated TSH 11/13/2022   Constipation 11/13/2022   Essential hypertension 11/13/2022   Vitamin B12 deficiency 07/03/2022   Vitamin D deficiency 07/03/2022   Type 2 diabetes mellitus with hyperglycemia, without long-term current use of insulin  (HCC) 07/03/2022   Moderate episode of recurrent major depressive disorder (HCC) 07/03/2022   Mixed hyperlipidemia 07/03/2022   Observed sleep apnea 04/10/2022   Closed right ankle fracture 10/29/2019   Home Medication(s) Prior to Admission  medications   Medication Sig Start Date End Date Taking? Authorizing Provider  albuterol  (VENTOLIN  HFA) 108 (90 Base) MCG/ACT inhaler Inhale 2 puffs into the lungs every 6 (six) hours as needed for wheezing or shortness of breath. 11/20/22   Cherlyn Labella, MD  apixaban  (ELIQUIS ) 5 MG TABS tablet Take 1 tablet (5 mg total) by mouth 2 (two) times daily. 02/08/23   Wyn Jackee VEAR Mickey., NP  empagliflozin  (JARDIANCE ) 25 MG TABS tablet Take 25 mg by mouth daily.    [provider]  furosemide  (LASIX ) 20 MG tablet TAKE 1 TABLET AS NEEDED FOR EDEMA OR FLUID (WEIGHT GAIN OF 2 LBS IN A DAY OR 5 LBS IN A WEEK). 01/06/23   Wyn Jackee VEAR Mickey., NP  insulin  aspart (NOVOLOG ) 100 UNIT/ML FlexPen CBG 70 - 120: 0 units  CBG 121 - 150: 1 unit  CBG 151 - 200: 2 units  CBG 201 - 250: 3 units  CBG 251 - 300: 5 units  CBG 301 - 350: 7 units  CBG 351 - 400: 9 units Patient taking differently: Inject 0-9 Units into the skin 3 (three) times daily as needed for high blood sugar. CBG 70 - 120: 0 units  CBG 121 - 150: 1 unit  CBG 151 - 200: 2 units  CBG 201 - 250: 3 units  CBG 251 - 300: 5 units  CBG 301 - 350: 7 units  CBG 351 - 400: 9 units 11/20/22   [name], [name], MD  insulin  isophane & regular human KwikPen (NOVOLIN  70/30 KWIKPEN) (70-30) 100 UNIT/ML KwikPen Inject 15 Units into the skin 2 (  two) times daily. Patient taking differently: Inject 8 Units into the skin 2 (two) times daily. 11/20/22   [name], [name], MD  levothyroxine  (SYNTHROID ) 75 MCG tablet Take 75 mcg by mouth at bedtime.    [provider]  loperamide  (IMODIUM  A-D) 2 MG tablet Take 2 mg by mouth 4 (four) times daily as needed for diarrhea or loose stools.    [provider]  metFORMIN  (GLUCOPHAGE ) 500 MG tablet Take 500 mg by mouth 2 (two) times daily with a meal.    [provider]  metoprolol  succinate (TOPROL -XL) 50 MG 24 hr tablet Take 1 tablet (50 mg total) by mouth daily. 03/07/24   Sheikh, Omair Latif, DO   omeprazole (PRILOSEC) 20 MG capsule Take 20 mg by mouth daily as needed (indigestion).    [provider]  ondansetron  (ZOFRAN ) 4 MG tablet Take 1 tablet (4 mg total) by mouth every 6 (six) hours as needed for nausea. 03/07/24   Sheikh, Omair Latif, DO  sacubitril -valsartan  (ENTRESTO ) 24-26 MG Take 1 tablet by mouth 2 (two) times daily. 02/12/24   Delford Maude BROCKS, MD  spironolactone  (ALDACTONE ) 25 MG tablet Take 1 tablet (25 mg total) by mouth daily. Patient not taking: Reported on 03/06/2024 12/27/22   Marcine Catalan M, PA-C  Vitamin D, Ergocalciferol, (DRISDOL) 1.25 MG (50000 UNIT) CAPS capsule Take 50,000 Units by mouth once a week. 01/15/24   [provider]                                                                                                                                    Past Surgical History Past Surgical History:  Procedure Laterality Date   CARDIOVERSION N/A 11/18/2022   Procedure: CARDIOVERSION;  Surgeon: Raford Riggs, MD;  Location: [facility] ENDOSCOPY;  Service: Cardiovascular;  Laterality: N/A;   ESOPHAGOGASTRODUODENOSCOPY N/A 11/15/2022   Procedure: ESOPHAGOGASTRODUODENOSCOPY (EGD);  Surgeon: Elicia Claw, MD;  Location: [facility];  Service: Gastroenterology;  Laterality: N/A;   NO PAST SURGERIES     ORIF ANKLE FRACTURE Right 10/29/2019   Procedure: OPEN REDUCTION INTERNAL FIXATION (ORIF) ANKLE FRACTURE;  Surgeon: Beverley Evalene BIRCH, MD;  Location: [facility] OR;  Service: Orthopedics;  Laterality: Right;   TEE WITHOUT CARDIOVERSION N/A 11/18/2022   Procedure: TRANSESOPHAGEAL ECHOCARDIOGRAM (TEE);  Surgeon: Raford Riggs, MD;  Location: [facility] ENDOSCOPY;  Service: Cardiovascular;  Laterality: N/A;   Family History Family History  Adopted: Yes    Social History Social History   Tobacco Use   Smoking status: Former    Current packs/day: 0.00    Average packs/day: 1 pack/day for 16.0 years (16.0 ttl pk-yrs)    Types: Cigarettes    Start date:  09/26/1986    Quit date: 09/26/2002    Years since quitting: 21.5   Smokeless tobacco: Never   Tobacco comments:    Age [age]  Vaping Use   Vaping status: Never Used  Substance Use Topics  Alcohol use: Not Currently    Comment: 4/year   Drug use: No   Allergies Codeine and Sulfa antibiotics  Review of Systems Review of Systems  Cardiovascular:  Positive for palpitations.    Physical Exam Vital Signs  I have reviewed the triage vital signs BP 110/66   Pulse 60   Temp 98 F (36.7 C) (Axillary)   Resp 18   SpO2 100%   Physical Exam Vitals and nursing note reviewed.  Constitutional:      General: She is not in acute distress.    Appearance: She is well-developed.  HENT:     Head: Normocephalic and atraumatic.   Eyes:     Conjunctiva/sclera: Conjunctivae normal.    Cardiovascular:     Rate and Rhythm: Regular rhythm. Tachycardia present.     Heart sounds: No murmur heard. Pulmonary:     Effort: Pulmonary effort is normal. No respiratory distress.     Breath sounds: Normal breath sounds.  Abdominal:     Palpations: Abdomen is soft.     Tenderness: There is no abdominal tenderness.   Musculoskeletal:        General: No swelling.     Cervical back: Neck supple.   Skin:    General: Skin is warm and dry.     Capillary Refill: Capillary refill takes less than 2 seconds.   Neurological:     Mental Status: She is alert.   Psychiatric:        Mood and Affect: Mood normal.     ED Results and Treatments Labs (all labs ordered are listed, but only abnormal results are displayed) Labs Reviewed  COMPREHENSIVE METABOLIC PANEL WITH GFR - Abnormal; Notable for the following components:      Result Value   CO2 21 (*)    Glucose, Bld 144 (*)    All other components within normal limits  CBC WITH DIFFERENTIAL/PLATELET - Abnormal; Notable for the following components:   WBC 15.2 (*)    Neutro Abs 11.1 (*)    All other components within normal limits  TROPONIN I  (HIGH SENSITIVITY)  TROPONIN I (HIGH SENSITIVITY)                                                                                                                          Radiology No results found.  Pertinent labs & imaging results that were available during my care of the patient were reviewed by me and considered in my medical decision making (see MDM for details).  Medications Ordered in ED Medications  propofol  (DIPRIVAN ) 10 mg/mL bolus/IV push (80 mcg Intravenous Given 03/23/24 2324)  lactated ringers  bolus 1,000 mL (0 mLs Intravenous Stopped 03/24/24 0033)  diltiazem  (CARDIZEM ) injection 15 mg (15 mg Intravenous Given 03/23/24 2226)  propofol  (DIPRIVAN ) 10 mg/mL bolus/IV push 130 mg (50 mg Intravenous Given 03/23/24 2322)  PHENYLephrine  80 mcg/ml in normal saline Adult IV Push Syringe (For Blood Pressure Support) (160 mcg Intravenous Given 03/23/24 2321)  Procedures .Critical Care  Performed by: Albertina Dixon, MD Authorized by: Albertina Dixon, MD   Critical care provider statement:    Critical care time (minutes):  30   Critical care was necessary to treat or prevent imminent or life-threatening deterioration of the following conditions:  Cardiac failure   Critical care was time spent personally by me on the following activities:  Development of treatment plan with patient or surrogate, discussions with consultants, evaluation of patient's response to treatment, examination of patient, ordering and review of laboratory studies, ordering and review of radiographic studies, ordering and performing treatments and interventions, pulse oximetry, re-evaluation of patient's condition and review of old charts .Sedation  Date/Time: 03/24/2024 2:23 AM  Performed by: Albertina Dixon, MD Authorized by: Albertina Dixon, MD   Consent:    Consent obtained:   Verbal   Consent given by:  Patient   Risks discussed:  Allergic reaction, dysrhythmia, inadequate sedation, nausea, prolonged hypoxia resulting in organ damage, prolonged sedation necessitating reversal, respiratory compromise necessitating ventilatory assistance and intubation and vomiting   Alternatives discussed:  Analgesia without sedation, anxiolysis and regional anesthesia Universal protocol:    Procedure explained and questions answered to patient or proxy's satisfaction: yes     Relevant documents present and verified: yes     Test results available: yes     Imaging studies available: yes     Required blood products, implants, devices, and special equipment available: yes     Site/side marked: yes     Immediately prior to procedure, a time out was called: yes     Patient identity confirmed:  Verbally with patient Indications:    Procedure necessitating sedation performed by:  Physician performing sedation Pre-sedation assessment:    Time since last food or drink:  1200   ASA classification: class 1 - normal, healthy patient     Mouth opening:  3 or more finger widths   Thyromental distance:  4 finger widths   Mallampati score:  I - soft palate, uvula, fauces, pillars visible   Neck mobility: normal     Pre-sedation assessments completed and reviewed: airway patency, cardiovascular function, hydration status, mental status, nausea/vomiting, pain level, respiratory function and temperature   A pre-sedation assessment was completed prior to the start of the procedure Immediate pre-procedure details:    Reassessment: Patient reassessed immediately prior to procedure     Reviewed: vital signs, relevant labs/tests and NPO status     Verified: bag valve mask available, emergency equipment available, intubation equipment available, IV patency confirmed, oxygen available and suction available   Procedure details (see MAR for exact dosages):    Preoxygenation:  Nasal cannula   Sedation:   Propofol    Intended level of sedation: deep   Intra-procedure monitoring:  Blood pressure monitoring, cardiac monitor, continuous pulse oximetry, frequent LOC assessments, frequent vital sign checks and continuous capnometry   Intra-procedure events: none     Total Provider sedation time (minutes):  15 Post-procedure details:   A post-sedation assessment was completed following the completion of the procedure.   Attendance: Constant attendance by certified staff until patient recovered     Recovery: Patient returned to pre-procedure baseline     Post-sedation assessments completed and reviewed: airway patency, cardiovascular function, hydration status, mental status, nausea/vomiting, pain level, respiratory function and temperature     Patient is stable for discharge or admission: yes     Procedure completion:  Tolerated well, no immediate complications .Cardioversion  Date/Time: 03/24/2024 2:23 AM  Performed by:  Omaira Mellen, MD Authorized by: Albertina Dixon, MD   Consent:    Consent obtained:  Verbal   Consent given by:  Patient   Risks discussed:  Cutaneous burn, death, pain and induced arrhythmia   Alternatives discussed:  Rate-control medication Pre-procedure details:    Cardioversion basis:  Elective   Rhythm:  Atrial flutter   Electrode placement:  Anterior-posterior Patient sedated: Yes. Refer to sedation procedure documentation for details of sedation.  Attempt one:    Cardioversion mode:  Synchronous   Shock (Joules):  200   Shock outcome:  Conversion to normal sinus rhythm Post-procedure details:    Patient status:  Awake   Patient tolerance of procedure:  Tolerated well, no immediate complications   (including critical care time)  Medical Decision Making / ED Course   This patient presents to the ED for concern of palpitations, this involves an extensive number of treatment options, and is a complaint that carries with it a high risk of complications and  morbidity.  The differential diagnosis includes a flutter, A-fib, SVT, electrolyte abnormality, dehydration  MDM: Patient seen emergency room for evaluation of palpitations and rapid heart rate.  Physical exam with a rapid regular tachycardia but is otherwise unremarkable.  ECG concerning for a flutter with 2-1 conduction.  Laboratory evaluation with leukocytosis to 15.2 but is otherwise unremarkable.  Patient given lactated Ringer 's and single push diltiazem  leading to improvement of rates but she remains in a flutter.  I spoke with the cardiology fellow on-call Dr. Donnel who agreed that the patient has been compliant with her Eliquis  since hospital discharge she would be a candidate for ED cardioversion.  Propofol  sedation performed with synchronized cardioversion and patient did have successful conversion to normal sinus rhythm.  Feeling improved on reevaluation.  As she has been converted to normal sinus rhythm she does not meet inpatient criteria for admission and will be discharged with outpatient cardiology follow-up.  Would benefit from closer outpatient EP evaluation for possible ablation.  Return precautions given of which he voiced understanding.   Additional history obtained:  -External records from outside source obtained and reviewed including: Chart review including previous notes, labs, imaging, consultation notes   Lab Tests: -I ordered, reviewed, and interpreted labs.   The pertinent results include:   Labs Reviewed  COMPREHENSIVE METABOLIC PANEL WITH GFR - Abnormal; Notable for the following components:      Result Value   CO2 21 (*)    Glucose, Bld 144 (*)    All other components within normal limits  CBC WITH DIFFERENTIAL/PLATELET - Abnormal; Notable for the following components:   WBC 15.2 (*)    Neutro Abs 11.1 (*)    All other components within normal limits  TROPONIN I (HIGH SENSITIVITY)  TROPONIN I (HIGH SENSITIVITY)      EKG   EKG  Interpretation Date/Time:  Saturday March 23 2024 23:27:20 EDT Ventricular Rate:  61 PR Interval:  126 QRS Duration:  86 QT Interval:  377 QTC Calculation: 380 R Axis:   31  Text Interpretation: Sinus rhythm Low voltage, precordial leads Baseline wander in lead(s) II III aVF Confirmed by Johnavon Mcclafferty ([id number]) on 03/24/2024 2:24:48 AM           Medicines ordered and prescription drug management: Meds ordered this encounter  Medications   lactated ringers  bolus 1,000 mL   diltiazem  (CARDIZEM ) injection 15 mg   propofol  (DIPRIVAN ) 10 mg/mL bolus/IV push 130 mg   PHENYLephrine  80 mcg/ml in normal saline  Adult IV Push Syringe (For Blood Pressure Support)   propofol  (DIPRIVAN ) 10 mg/mL bolus/IV push    -I have reviewed the patients home medicines and have made adjustments as needed  Critical interventions Diltiazem , fluids, sedation with cardioversion  Consultations Obtained: I requested consultation with the cardiologist on-call,  and discussed lab and imaging findings as well as pertinent plan - they recommend: Bedside cardioversion   Cardiac Monitoring: The patient was maintained on a cardiac monitor.  I personally viewed and interpreted the cardiac monitored which showed an underlying rhythm of: A flutter, NSR  Social Determinants of Health:  Factors impacting patients care include: none   Reevaluation: After the interventions noted above, I reevaluated the patient and found that they have :improved  Co morbidities that complicate the patient evaluation  Past Medical History:  Diagnosis Date   Anemia    Arthritis    Depression    Hypertension    Panic attack    Paroxysmal atrial flutter (HCC)    Followed at [facility] by Dr. Epifanio and is currently in workup for ablation      Dispostion: I considered admission for this patient, but at this time she does not meet inpatient criteria for admission and will be discharged with outpatient  follow-up.     Final Clinical Impression(s) / ED Diagnoses Final diagnoses:  Typical atrial flutter [facility])     @PCDICTATION @    Albertina Dixon, MD 03/24/24 [phone]

## 2024-04-01 NOTE — Progress Notes (Signed)
 Debbie Lee is here today for [place] for Hep B her last Hep B  was on 6.9.2025. Hep B was administered today in Left  deltoid w/o any complaints.     Injection/Vaccine: Used company's stock or pt provided Verified by: [facility], CMA NDC: [id number] LOT: [id number] Expiration: [id number]  Electronically signed by: Manuelita [place], LPN 10/28/7972 89:81 AM

## 2024-04-18 DIAGNOSIS — I4892 Unspecified atrial flutter: Secondary | ICD-10-CM | POA: Diagnosis not present

## 2024-05-08 NOTE — Progress Notes (Signed)
 GI Clinic Note  Dear Dr. Mliss Jenkins Minors, NP   Thank you for allowing us  to see Debbie Lee in the [facility] on 05/08/24.  Please review our records below:  History of Present Illness The patient presents for evaluation of diarrhea and hemorrhoids.  She has been experiencing loose stools for the past 1 to 2 months, with the consistency varying between watery and pudding-like. Despite attempts to identify potential dietary triggers, no specific food items have been identified as causing the issue. She has been managing her symptoms with loperamide , taking one tablet initially and an additional two if there is no improvement within a few hours. This regimen has been ongoing for several months. Without loperamide , she estimates she would need to use the bathroom less than 4 to 6 times in a 24-hour period. She reports the presence of mucus in her stools but has not observed any oiliness, greasiness, or floating stools. She also reports bloating, cramping, and gas, but no bloody or black-colored stools. She has noticed a slight improvement in her diarrhea since starting a modified FODMAP diet. Over the past month, she has experienced a decrease in appetite, which is unusual for her. She avoids dairy products due to intolerance but can consume lactose-free milk and yogurt without issue. She also consumes cheddar cheese and Parmesan cheese and cooks with butter. She avoids ice cream and milk chocolate. She has noticed a gradual weight loss. She enjoys coffee and tea but has reduced her intake to one cup of each per day due to stomach discomfort. She has found that kombucha helps her stomach feel better. She has noticed that her stomach seems to react negatively to beans and greens, including lettuce, spinach, and kale. She was scheduled for a colonoscopy last year but was unable to undergo the procedure due to illness. An endoscopy was performed, but her oxygen  levels dropped significantly during the procedure.  She also reports significant bleeding from hemorrhoids, necessitating the daily use of a sanitary pad. She believes she has both internal and external hemorrhoids. She notes that while the loose stools do not exert pressure on the hemorrhoids, they still cause irritation.  She uses a CPAP machine at night and experiences some nasal congestion in the morning. She was adopted in 1972 and does not have any information about her family history. She was recently started on a statin and a low-dose SSRI but has not noticed any changes in her symptoms since starting these medications. She has been on metformin  since 10/2022. She is currently taking Eliquis  for atrial flutter.  SOCIAL HISTORY Occupation: Conservation officer, nature Diet: Modified FODMAP diet, avoids dairy products except lactose-free milk and yogurt, consumes cheddar cheese, cooks with butter, avoids ice cream and milk chocolate. Coffee/Tea/Caffeine-containing Drinks: Drinks one cup of coffee in the morning, consumes black tea regularly.  FAMILY HISTORY - Negative for colon cancer and colon polyps.    ROS:  A complete review of systems was otherwise negative, except as noted in the HPI.  Past Medical History: Medical History[1]  Social History: Social History[2]  Family History: family history is not on file. She was adopted.  Medications: Current Medications[3]  Allergies: Allergies[4]  Physical Exam: Blood pressure (!) 120/56, pulse 62, height 1.575 m (5' 2), weight 127 kg (280 lb 11.2 oz), SpO2 98%. General:  No acute distress, alert and oriented x 3, well-groomed HEENT:  PERRLA, EOMI, no scleral icterus, no lymphadenopathy, MMM Cardiovascular:  Regular rate and rhythm,  no murmurs, rubs or gallops Respiratory:  Clear to auscultation bilaterally, no wheezes, rales, or rhonchi Abdomen:  Soft, nondistended, nontender, normoactive bowel sounds, no hepatomegaly, no  splenomegaly Extremities:  Warm and well-perfused, no clubbing, cyanosis, or edema  Labs: Lab Results  Component Value Date   ALT 14 04/02/2024   AST 15 04/02/2024   BILITOT 0.8 04/02/2024   No results found for: INR, PROTIME Lab Results  Component Value Date   CREATININE 0.94 04/02/2024   BUN 17 04/02/2024   NA 137 04/02/2024   K 4.4 04/02/2024   CL 103 04/02/2024   CO2 25 04/02/2024   Lab Results  Component Value Date   WBC 10.20 04/02/2024   HGB 13.1 04/02/2024   HCT 40.8 04/02/2024   MCV 85.9 04/02/2024   PLT 278 04/02/2024    Assessment & Plan 1. Diarrhea: Potential etiologies include infections, inflammatory conditions (e.g., Crohn's disease, ulcerative colitis), irritable bowel syndrome, maldigestion, food intolerances, exocrine pancreatic insufficiency, and celiac disease. Continue using loperamide  as needed, with a maximum dosage of 8 tablets within a 24-hour period. Discontinue loperamide  use at least 3 days prior to stool testing. Implement a strict lactose-free diet for the next few weeks. Reduce caffeine intake by switching to decaffeinated tea and coffee. Add a fiber supplement to the daily regimen. Consider a high-potency probiotic course (>50 billion colony-forming units per capsule). Dietary modifications should include consuming simple carbohydrates, lean proteins, and cooked vegetables. Blood and stool tests have been ordered to investigate the cause of diarrhea. Orders for endoscopy and colonoscopy have been placed, and the scheduling team will arrange these procedures. Inform if any abnormalities are detected in blood or stool tests prior to procedures; appropriate medication will be prescribed.  2. Hemorrhoids: Due to blood thinner use status she is not an ideal candidate for hemorrhoidal ligation with Dr. Kendall.  Consider pending results of colonoscopy seeing Colorectal surgery.  The recovery period and potential discomfort associated with the procedure  were discussed.  Follow-up: A follow-up appointment will be scheduled approximately 2 weeks after the procedures to discuss the results and explore different treatment options.  Orders Placed This Encounter  Procedures  . Clostridioides difficile Antigen and Toxin EIA  . Gastrointestinal Pathogen Profile, Stool, NAAT  . Tissue Transglutaminase (tTG), IgA  . Immunoglobulin A (IgA), Quantitative  . CBC with Differential  . Comprehensive Metabolic Panel  . TSH  . Vitamin B12  . Vitamin D , 25-Hydroxy  . C-Reactive Protein (CRP)  . Sedimentation Rate (ESR)  . Anemia Profile  . Calprotectin, Fecal  . Fecal Lactoferrin, Quantitative  . Helicobacter pylori Stool Antigen  . Pancreatic Elastase, Fecal  . Endo EGD  . Endo Colon w Biopsy     I have personally spent 40 minutes involved in face-to-face and non-face-to-face activities for this patient on the day of the visit.  We thoroughly discussed the patient's diagnosis, pertinent exam, radiographic findings, natural history of the diagnosis, and plan moving forward.   All questions from the patient were answered.  I instructed the patient to call my office should problems arise.  I agree that the documentation is accurate and complete.   Charmaine FALCON. Ollis PA-C       [1] Past Medical History: Diagnosis Date  . Allergic   . Allergic rhinitis   . Anxiety   . Arthritis   . CHF (congestive heart failure)    (CMD) 10/2022  . Constipation 11/13/2022  . Coronary artery disease 11/13/22   Atrial flutter  .  Depression   . Diabetes mellitus    (CMD)   . Disease of thyroid  gland   . GERD (gastroesophageal reflux disease)   . Heart murmur   . Hypertension   . Iron  deficiency anemia 04/08/2022  . Sleep apnea, obstructive   . Vitamin B12 deficiency 04/08/2022  . Vitamin D  deficiency 04/08/2022  [2] Social History Socioeconomic History  . Marital status: Single  . Number of children: 1  . Years of education: 38  . Highest education  level: Associate degree: academic program  Tobacco Use  . Smoking status: Former    Current packs/day: 1.50    Average packs/day: 1.5 packs/day for 10.0 years (15.0 ttl pk-yrs)    Types: Cigarettes    Passive exposure: Past  . Smokeless tobacco: Never  Vaping Use  . Vaping status: Never Used  Substance and Sexual Activity  . Alcohol use: Never  . Drug use: Never  . Sexual activity: Not Currently    Partners: Choose not to disclose    Birth control/protection: Implant    Comment: Nexplanon   Social Drivers of Health   Food Insecurity: Low Risk  (05/08/2024)   Food vital sign   . Within the past 12 months, you worried that your food would run out before you got money to buy more: Never true   . Within the past 12 months, the food you bought just didn't last and you didn't have money to get more: Never true  Transportation Needs: No Transportation Needs (05/08/2024)   Transportation   . In the past 12 months, has lack of reliable transportation kept you from medical appointments, meetings, work or from getting things needed for daily living? : No  Safety: Low Risk  (05/08/2024)   Safety   . How often does anyone, including family and friends, physically hurt you?: Never   . How often does anyone, including family and friends, insult or talk down to you?: Never   . How often does anyone, including family and friends, threaten you with harm?: Never   . How often does anyone, including family and friends, scream or curse at you?: Never  Living Situation: Low Risk  (05/08/2024)   Living Situation   . What is your living situation today?: I have a steady place to live   . Think about the place you live. Do you have problems with any of the following? Choose all that apply:: None/None on this list  [3] Current Outpatient Medications  Medication Sig Dispense Refill  . carvediloL  (COREG ) 12.5 mg tablet Take 1 tablet (12.5 mg total) by mouth in the morning and 1 tablet (12.5 mg total) in the  evening. Take with meals. 180 tablet 3  . Dexcom G7 Sensor Replace every 10 days. 3 each 5  . Eliquis  5 mg tab TAKE 1 TABLET BY MOUTH TWICE A DAY 180 tablet 3  . empagliflozin  (Jardiance ) 25 mg tab TAKE 1 TABLET BY MOUTH EVERY DAY 90 tablet 1  . Entresto  24-26 mg per tablet Take 1 tablet by mouth 2 (two) times a day. 180 tablet 1  . ergocalciferol  (VITAMIN D2) 1,250 mcg (50,000 unit) capsule Take 1 capsule (50,000 Units total) by mouth once a week. 12 capsule 3  . furosemide  (LASIX ) 20 mg tablet as needed.    . insulin  aspart U-100 (NovoLOG  Flexpen U-100 Insulin ) 100 unit/mL (3 mL) pen Inject 10 Units under the skin daily. 15 mL 1  . insulin  NPH-insulin  regular (NovoLIN  70-30 FlexPen U-100) 100 unit/mL (  70-30) pen pen Inject up to 10 units twice daily 30 mL 1  . levothyroxine  (SYNTHROID ) 75 mcg tablet Take 1 tablet (75 mcg total) by mouth every morning. 90 tablet 1  . metFORMIN  (GLUCOPHAGE ) 500 mg tablet Take 1 tablet (500 mg total) by mouth in the morning and 1 tablet (500 mg total) in the evening. Take with meals. 180 tablet 3  . omeprazole (PriLOSEC) 20 mg DR capsule     . pen needle, diabetic (BD Ultra-Fine Mini Pen Needle) 31 gauge x 3/16 ndle 100 EACH BY DOES NOT APPLY ROUTE 2 (TWO) TIMES DAILY. 300 each 3  . rosuvastatin  (CRESTOR ) 5 mg tablet Take 1 tablet (5 mg total) by mouth daily. 90 tablet 3  . sertraline  (ZOLOFT ) 50 mg tablet Take 1 tablet (50 mg total) by mouth daily. 90 tablet 3  . vitamin B complex (B COMPLEX-VITAMIN B12 ORAL) Take by mouth daily.     No current facility-administered medications for this visit.  [4] Allergies Allergen Reactions  . Codeine Other (See Comments)    Hallucinations  . Sulfamethoxazole Other (See Comments)    Was told

## 2024-05-09 ENCOUNTER — Observation Stay (HOSPITAL_COMMUNITY)
Admission: EM | Admit: 2024-05-09 | Discharge: 2024-05-10 | Disposition: A | Discharge status: Home / Self Care | Attending: Cardiovascular Disease | Admitting: Cardiovascular Disease

## 2024-05-09 ENCOUNTER — Other Ambulatory Visit: Payer: Self-pay

## 2024-05-09 ENCOUNTER — Emergency Department (HOSPITAL_COMMUNITY)

## 2024-05-09 ENCOUNTER — Encounter (HOSPITAL_COMMUNITY): Payer: Self-pay

## 2024-05-09 DIAGNOSIS — E119 Type 2 diabetes mellitus without complications: Secondary | ICD-10-CM | POA: Diagnosis not present

## 2024-05-09 DIAGNOSIS — I10 Essential (primary) hypertension: Secondary | ICD-10-CM | POA: Diagnosis not present

## 2024-05-09 DIAGNOSIS — F1590 Other stimulant use, unspecified, uncomplicated: Secondary | ICD-10-CM | POA: Diagnosis not present

## 2024-05-09 DIAGNOSIS — I4892 Unspecified atrial flutter: Principal | ICD-10-CM | POA: Insufficient documentation

## 2024-05-09 DIAGNOSIS — D509 Iron deficiency anemia, unspecified: Secondary | ICD-10-CM | POA: Diagnosis not present

## 2024-05-09 DIAGNOSIS — I4891 Unspecified atrial fibrillation: Secondary | ICD-10-CM | POA: Diagnosis present

## 2024-05-09 LAB — CBC WITH DIFFERENTIAL/PLATELET
Abs Immature Granulocytes: 0.06 K/uL (ref 0.00–0.07)
Basophils Absolute: 0 K/uL (ref 0.0–0.1)
Basophils Relative: 0 %
Eosinophils Absolute: 0.2 K/uL (ref 0.0–0.5)
Eosinophils Relative: 1 %
HCT: 37.6 % (ref 36.0–46.0)
Hemoglobin: 11.8 g/dL — ABNORMAL LOW (ref 12.0–15.0)
Immature Granulocytes: 0 %
Lymphocytes Relative: 21 %
Lymphs Abs: 2.9 K/uL (ref 0.7–4.0)
MCH: 28 pg (ref 26.0–34.0)
MCHC: 31.4 g/dL (ref 30.0–36.0)
MCV: 89.1 fL (ref 80.0–100.0)
Monocytes Absolute: 1 K/uL (ref 0.1–1.0)
Monocytes Relative: 7 %
Neutro Abs: 9.7 K/uL — ABNORMAL HIGH (ref 1.7–7.7)
Neutrophils Relative %: 71 %
Platelets: 280 K/uL (ref 150–400)
RBC: 4.22 MIL/uL (ref 3.87–5.11)
RDW: 14.6 % (ref 11.5–15.5)
WBC: 13.8 K/uL — ABNORMAL HIGH (ref 4.0–10.5)
nRBC: 0 % (ref 0.0–0.2)

## 2024-05-09 LAB — BASIC METABOLIC PANEL WITH GFR
Anion gap: 10 (ref 5–15)
BUN: 18 mg/dL (ref 6–20)
CO2: 24 mmol/L (ref 22–32)
Calcium: 9 mg/dL (ref 8.9–10.3)
Chloride: 104 mmol/L (ref 98–111)
Creatinine, Ser: 1.07 mg/dL — ABNORMAL HIGH (ref 0.44–1.00)
GFR, Estimated: >60 mL/min
Glucose, Bld: 129 mg/dL — ABNORMAL HIGH (ref 70–99)
Potassium: 3.8 mmol/L (ref 3.5–5.1)
Sodium: 138 mmol/L (ref 135–145)

## 2024-05-09 MED ORDER — ETOMIDATE 2 MG/ML IV SOLN
10.0000 mg | Freq: Once | INTRAVENOUS | Status: AC
  Administered 2024-05-09: 10 mg via INTRAVENOUS
  Filled 2024-05-09: qty 10

## 2024-05-09 MED ORDER — LOPERAMIDE HCL 2 MG PO CAPS: 2.0000 mg | ORAL_CAPSULE | Freq: Four times a day (QID) | ORAL | Status: DC | PRN

## 2024-05-09 MED ORDER — INSULIN ASPART 100 UNIT/ML IJ SOLN: 0.0000 [IU] | Freq: Three times a day (TID) | INTRAMUSCULAR | Status: DC

## 2024-05-09 MED ORDER — VITAMIN D (ERGOCALCIFEROL) 1.25 MG (50000 UNIT) PO CAPS: 50000.0000 [IU] | ORAL_CAPSULE | ORAL | Status: DC

## 2024-05-09 MED ORDER — SERTRALINE HCL 50 MG PO TABS: 50.0000 mg | ORAL_TABLET | Freq: Every day | ORAL | Status: DC

## 2024-05-09 MED ORDER — ONDANSETRON HCL 4 MG/2ML IJ SOLN: 4.0000 mg | Freq: Four times a day (QID) | INTRAMUSCULAR | Status: DC | PRN

## 2024-05-09 MED ORDER — DILTIAZEM HCL-DEXTROSE 125-5 MG/125ML-% IV SOLN (PREMIX)
5.0000 mg/h | INTRAVENOUS | Status: DC
  Administered 2024-05-09: 5 mg/h via INTRAVENOUS
  Filled 2024-05-09: qty 125

## 2024-05-09 MED ORDER — CARVEDILOL 12.5 MG PO TABS: 12.5000 mg | ORAL_TABLET | Freq: Two times a day (BID) | ORAL | Status: DC

## 2024-05-09 MED ORDER — INSULIN ISOPHANE & REGULAR (HUMAN 70-30)100 UNIT/ML KWIKPEN: 8.0000 [IU] | PEN_INJECTOR | Freq: Two times a day (BID) | SUBCUTANEOUS | Status: DC

## 2024-05-09 MED ORDER — NITROGLYCERIN 0.4 MG SL SUBL: 0.4000 mg | SUBLINGUAL_TABLET | SUBLINGUAL | Status: DC | PRN

## 2024-05-09 MED ORDER — ACETAMINOPHEN 325 MG PO TABS: 650.0000 mg | ORAL_TABLET | ORAL | Status: DC | PRN

## 2024-05-09 MED ORDER — PANTOPRAZOLE SODIUM 40 MG PO TBEC: 40.0000 mg | DELAYED_RELEASE_TABLET | Freq: Every day | ORAL | Status: DC

## 2024-05-09 MED ORDER — DILTIAZEM LOAD VIA INFUSION
10.0000 mg | Freq: Once | INTRAVENOUS | Status: DC
  Filled 2024-05-09: qty 10

## 2024-05-09 MED ORDER — EMPAGLIFLOZIN 25 MG PO TABS: 25.0000 mg | ORAL_TABLET | Freq: Every day | ORAL | Status: DC

## 2024-05-09 MED ORDER — APIXABAN 5 MG PO TABS
5.0000 mg | ORAL_TABLET | Freq: Two times a day (BID) | ORAL | Status: DC
  Administered 2024-05-09: 5 mg via ORAL
  Filled 2024-05-09: qty 1

## 2024-05-09 MED ORDER — SACUBITRIL-VALSARTAN 24-26 MG PO TABS
1.0000 | ORAL_TABLET | Freq: Two times a day (BID) | ORAL | Status: DC
  Filled 2024-05-09: qty 1

## 2024-05-09 MED ORDER — ROSUVASTATIN CALCIUM 20 MG PO TABS
20.0000 mg | ORAL_TABLET | Freq: Every day | ORAL | Status: DC
  Administered 2024-05-09: 20 mg via ORAL
  Filled 2024-05-09: qty 1

## 2024-05-09 NOTE — ED Triage Notes (Signed)
 PER EMS: pt is from work with c/o chest fluttering and chest tightness that has since resolved. Her fitbit stated HR was 140, hx of aflutter and is currently in aflutter. She has a scheduled ablation later this month.   BP- 128/80, HR-140, O2-99% RA, RR-18 CBG-87

## 2024-05-09 NOTE — Sedation Documentation (Addendum)
 Chief Complaint  Patient presents with   Tachycardia   Past Medical History:  Diagnosis Date   Anemia    Arthritis    Depression    Hypertension    Panic attack    Paroxysmal atrial flutter (HCC)    Followed at [facility] by Dr. Epifanio and is currently in workup for ablation    BP (!) 127/108   Pulse 72   Temp 98.3 F (36.8 C) (Oral)   Resp 19   Ht 5' 2 (1.575 m)   Wt 130 kg   LMP  (Approximate)   SpO2 100%   BMI 52.42 kg/m   Pt was placed supine on stretcher, pt was shocked with 100J, pt converted in to sinus rhythm, but immediately went back into afib RVR, pt was then shocked with 120J pt converted again and then immediately went back into afib RVR.  MD ordered Cardizem  bolus and Drip, will wait for Pharm approval to administer   Jessicalynn Deshong, Ozell Journey

## 2024-05-09 NOTE — Progress Notes (Signed)
 RT called to pt room to present for cardiovert, oxygen, CO2 and bag valve in room

## 2024-05-09 NOTE — ED Notes (Signed)
 Pt converted back to NSR, EKG captured at this time. Dr. Claudene at bedside, verbal orders to just start Diltiazem  at rate of 5/hr with no bolus at this time given the recent conversion to sinus rhythm.

## 2024-05-09 NOTE — ED Provider Notes (Addendum)
 [place] EMERGENCY DEPARTMENT AT [facility] Provider Note   CSN: [id number] Arrival date & time: 05/09/24  8062     Patient presents with: Tachycardia   Debbie Lee is a 53 y.o. female.   Patient with rapid heart rate developing at 1830.  Patient with a known history of rapid atrial flutter.  Followed by cardiology has an ablation planned for the end of August.  Patient required cardioversion on June 28.  At that time she got 200 J was in atrial flutter at that time.  It did not work.  Patient in no distress no significant chest pain no shortness of breath.  EKG here today shows heart rate 147 with a 2-1 AV conduction of atrial flutter.  Patient has been taking her Eliquis  as directed.  Patient is also been taking her Coreg  she said but she does have Toprol  XL listed.  She has not run out of any of her medicines.       Prior to Admission medications   Medication Sig Start Date End Date Taking? Authorizing Provider  albuterol  (VENTOLIN  HFA) 108 (90 Base) MCG/ACT inhaler Inhale 2 puffs into the lungs every 6 (six) hours as needed for wheezing or shortness of breath. 11/20/22   Cherlyn Labella, MD  apixaban  (ELIQUIS ) 5 MG TABS tablet Take 1 tablet (5 mg total) by mouth 2 (two) times daily. 02/08/23   Wyn Jackee VEAR Mickey., NP  empagliflozin  (JARDIANCE ) 25 MG TABS tablet Take 25 mg by mouth daily.    [provider]  furosemide  (LASIX ) 20 MG tablet TAKE 1 TABLET AS NEEDED FOR EDEMA OR FLUID (WEIGHT GAIN OF 2 LBS IN A DAY OR 5 LBS IN A WEEK). 01/06/23   Wyn Jackee VEAR Mickey., NP  insulin  aspart (NOVOLOG ) 100 UNIT/ML FlexPen CBG 70 - 120: 0 units  CBG 121 - 150: 1 unit  CBG 151 - 200: 2 units  CBG 201 - 250: 3 units  CBG 251 - 300: 5 units  CBG 301 - 350: 7 units  CBG 351 - 400: 9 units Patient taking differently: Inject 0-9 Units into the skin 3 (three) times daily as needed for high blood sugar. CBG 70 - 120: 0 units  CBG 121 - 150: 1 unit  CBG 151 - 200: 2 units  CBG 201 -  250: 3 units  CBG 251 - 300: 5 units  CBG 301 - 350: 7 units  CBG 351 - 400: 9 units 11/20/22   [name], [name], MD  insulin  isophane & regular human KwikPen (NOVOLIN  70/30 KWIKPEN) (70-30) 100 UNIT/ML KwikPen Inject 15 Units into the skin 2 (two) times daily. Patient taking differently: Inject 8 Units into the skin 2 (two) times daily. 11/20/22   [name], [name], MD  levothyroxine  (SYNTHROID ) 75 MCG tablet Take 75 mcg by mouth at bedtime.    [provider]  loperamide  (IMODIUM  A-D) 2 MG tablet Take 2 mg by mouth 4 (four) times daily as needed for diarrhea or loose stools.    [provider]  metFORMIN  (GLUCOPHAGE ) 500 MG tablet Take 500 mg by mouth 2 (two) times daily with a meal.    [provider]  metoprolol  succinate (TOPROL -XL) 50 MG 24 hr tablet Take 1 tablet (50 mg total) by mouth daily. 03/07/24   Sheikh, Omair Latif, DO  omeprazole (PRILOSEC) 20 MG capsule Take 20 mg by mouth daily as needed (indigestion).    [provider]  ondansetron  (ZOFRAN ) 4 MG tablet Take 1  tablet (4 mg total) by mouth every 6 (six) hours as needed for nausea. 03/07/24   Sherrill Cable Latif, DO  sacubitril -valsartan  (ENTRESTO ) 24-26 MG Take 1 tablet by mouth 2 (two) times daily. 02/12/24   Nishan, Peter C, MD  spironolactone  (ALDACTONE ) 25 MG tablet Take 1 tablet (25 mg total) by mouth daily. Patient not taking: Reported on 03/06/2024 12/27/22   Marcine Catalan M, PA-C  Vitamin D , Ergocalciferol , (DRISDOL ) 1.25 MG (50000 UNIT) CAPS capsule Take 50,000 Units by mouth once a week. 01/15/24   [provider]    Allergies: Codeine and Sulfa antibiotics    Review of Systems  Constitutional:  Negative for chills and fever.  HENT:  Negative for ear pain and sore throat.   Eyes:  Negative for pain and visual disturbance.  Respiratory:  Negative for cough and shortness of breath.   Cardiovascular:  Positive for palpitations. Negative for chest pain.  Gastrointestinal:  Negative  for abdominal pain and vomiting.  Genitourinary:  Negative for dysuria and hematuria.  Musculoskeletal:  Negative for arthralgias and back pain.  Skin:  Negative for color change and rash.  Neurological:  Negative for seizures and syncope.  All other systems reviewed and are negative.   Updated Vital Signs BP (!) 127/99   Pulse (!) 147   Temp 98.3 F (36.8 C) (Oral)   Resp 20   LMP  (Approximate)   SpO2 100%   Physical Exam Vitals and nursing note reviewed.  Constitutional:      General: She is not in acute distress.    Appearance: Normal appearance. She is well-developed. She is not ill-appearing.  HENT:     Head: Normocephalic and atraumatic.  Eyes:     Conjunctiva/sclera: Conjunctivae normal.  Cardiovascular:     Rate and Rhythm: Regular rhythm. Tachycardia present.     Heart sounds: No murmur heard. Pulmonary:     Effort: Pulmonary effort is normal. No respiratory distress.     Breath sounds: Rhonchi present. No wheezing or rales.  Abdominal:     Palpations: Abdomen is soft.     Tenderness: There is no abdominal tenderness.  Musculoskeletal:        General: No swelling.     Cervical back: Neck supple.  Skin:    General: Skin is warm and dry.     Capillary Refill: Capillary refill takes less than 2 seconds.  Neurological:     General: No focal deficit present.     Mental Status: She is alert and oriented to person, place, and time.  Psychiatric:        Mood and Affect: Mood normal.     (all labs ordered are listed, but only abnormal results are displayed) Labs Reviewed  CBC WITH DIFFERENTIAL/PLATELET  BASIC METABOLIC PANEL WITH GFR    EKG: None  Radiology: No results found.   .Cardioversion  Date/Time: 05/09/2024 8:41 PM  Performed by: Dang Mathison, MD Authorized by: Nevan Creighton, MD   Consent:    Consent obtained:  Verbal   Consent given by:  Patient   Risks discussed:  Cutaneous burn, death and induced arrhythmia   Alternatives  discussed:  Rate-control medication Pre-procedure details:    Cardioversion basis:  Emergent   Rhythm:  Atrial flutter   Electrode placement:  Anterior-posterior Patient sedated: Yes. Refer to sedation procedure documentation for details of sedation.  Attempt one:    Cardioversion mode:  Synchronous   Waveform:  Biphasic   Shock (Joules):  100   Shock  outcome:  Conversion to normal sinus rhythm Attempt two:    Cardioversion mode:  Synchronous   Waveform:  Biphasic   Shock (Joules):  100   Shock outcome:  Conversion to normal sinus rhythm Post-procedure details:    Patient status:  Awake   Patient tolerance of procedure:  Tolerated well, no immediate complications Comments:     Both x 800 J patient converted to normal sinus rhythm.  But it did not last she went back into rapid atrial flutter.  Patient tolerated procedure well.     Medications Ordered in the ED  etomidate  (AMIDATE ) injection 10 mg (10 mg Intravenous Given 05/09/24 2030)                                    Medical Decision Making Amount and/or Complexity of Data Reviewed Labs: ordered. Radiology: ordered.  Risk Prescription drug management. Decision regarding hospitalization.   Patient being prepared for cardioversion.  They used 200 J the last time normally for atrial flutter less joules can be used.  Will try 100 J.  Will use etomidate  to sedate her.  Will have her on oxygen.  Last time she had anything eat or drink at 1830.  CRITICAL CARE Performed by: Martin Smeal Total critical care time: 45 minutes Critical care time was exclusive of separately billable procedures and treating other patients. Critical care was necessary to treat or prevent imminent or life-threatening deterioration. Critical care was time spent personally by me on the following activities: development of treatment plan with patient and/or surrogate as well as nursing, discussions with consultants, evaluation of patient's  response to treatment, examination of patient, obtaining history from patient or surrogate, ordering and performing treatments and interventions, ordering and review of laboratory studies, ordering and review of radiographic studies, pulse oximetry and re-evaluation of patient's condition.   Patient cardioverted x 2 after receiving 10 mg of etomidate .  She converted both times but did not hold.  Before going back into the rhythm she was in before.  Will contact cardiology for admission will start diltiazem .  Discussed with Dr. Adochio on for unassigned cardiology.  Patient is followed by [facility].  He will come see her and admit her.  Final diagnoses:  Paroxysmal atrial flutter [facility])  Atrial flutter with rapid ventricular response [facility])    ED Discharge Orders     None          Geraldene Hamilton, MD 05/09/24 2026    Geraldene Hamilton, MD 05/09/24 2040    Geraldene Hamilton, MD 05/09/24 [id number]    Geraldene Hamilton, MD 05/09/24 [id number]    Geraldene Hamilton, MD 05/09/24 2052

## 2024-05-09 NOTE — H&P (Signed)
 Referring Physician: Zackowski, Scott, MD  Debbie Lee is an 53 y.o. female.                       Chief Complaint: Atrial flutter with RVR  HPI: 53 years old white female with PMH of HTN, Panic attack, depression, anemia and arthritis has recurrent palpitations since 1830 today. She was cardioverted x 2 without success. Currently she is in sinus rhythm 1 minute after my examination. She denies chest pain. She is scheduled for ablation in 10-14 days by her doctor at [facility]. She admits to caffeine intake as 1-2 cups of coffee and ice-tea with caffeine. She has mild iron  deficiency anemia.  Past Medical History:  Diagnosis Date   Anemia    Arthritis    Depression    Hypertension    Panic attack    Paroxysmal atrial flutter (HCC)    Followed at [facility] by Dr. Epifanio and is currently in workup for ablation      Past Surgical History:  Procedure Laterality Date   CARDIOVERSION N/A 11/18/2022   Procedure: CARDIOVERSION;  Surgeon: Raford Riggs, MD;  Location: [facility] ENDOSCOPY;  Service: Cardiovascular;  Laterality: N/A;   ESOPHAGOGASTRODUODENOSCOPY N/A 11/15/2022   Procedure: ESOPHAGOGASTRODUODENOSCOPY (EGD);  Surgeon: Elicia Claw, MD;  Location: [facility];  Service: Gastroenterology;  Laterality: N/A;   NO PAST SURGERIES     ORIF ANKLE FRACTURE Right 10/29/2019   Procedure: OPEN REDUCTION INTERNAL FIXATION (ORIF) ANKLE FRACTURE;  Surgeon: Beverley Evalene BIRCH, MD;  Location: [facility] OR;  Service: Orthopedics;  Laterality: Right;   TEE WITHOUT CARDIOVERSION N/A 11/18/2022   Procedure: TRANSESOPHAGEAL ECHOCARDIOGRAM (TEE);  Surgeon: Raford Riggs, MD;  Location: [facility] ENDOSCOPY;  Service: Cardiovascular;  Laterality: N/A;    Family History  Adopted: Yes   Social History:  reports that she quit smoking about 21 years ago. Her smoking use included cigarettes. She started smoking about 37 years ago. She has a 16 pack-year smoking history. She has never used smokeless tobacco.  She reports that she does not currently use alcohol. She reports that she does not use drugs.  Allergies:  Allergies  Allergen Reactions   Codeine Other (See Comments)    Hallucinations. Narcotics cause vomiting.   Sulfa Antibiotics Other (See Comments)    lifelong allergy    (Not in a hospital admission)   Results for orders placed or performed during the hospital encounter of 05/09/24 (from the past 48 hours)  CBC with Differential/Platelet     Status: Abnormal   Collection Time: 05/09/24  8:31 PM  Result Value Ref Range   WBC 13.8 (H) 4.0 - 10.5 K/uL   RBC 4.22 3.87 - 5.11 MIL/uL   Hemoglobin 11.8 (L) 12.0 - 15.0 g/dL   HCT 62.3 63.9 - 53.9 %   MCV 89.1 80.0 - 100.0 fL   MCH 28.0 26.0 - 34.0 pg   MCHC 31.4 30.0 - 36.0 g/dL   RDW 85.3 88.4 - 84.4 %   Platelets 280 150 - 400 K/uL   nRBC 0.0 0.0 - 0.2 %   Neutrophils Relative % 71 %   Neutro Abs 9.7 (H) 1.7 - 7.7 K/uL   Lymphocytes Relative 21 %   Lymphs Abs 2.9 0.7 - 4.0 K/uL   Monocytes Relative 7 %   Monocytes Absolute 1.0 0.1 - 1.0 K/uL   Eosinophils Relative 1 %   Eosinophils Absolute 0.2 0.0 - 0.5 K/uL   Basophils Relative 0 %  Basophils Absolute 0.0 0.0 - 0.1 K/uL   Immature Granulocytes 0 %   Abs Immature Granulocytes 0.06 0.00 - 0.07 K/uL    Comment: Performed at [facility], [address], [place], [place] [address]  Basic metabolic panel with GFR     Status: Abnormal   Collection Time: 05/09/24  8:31 PM  Result Value Ref Range   Sodium 138 135 - 145 mmol/L   Potassium 3.8 3.5 - 5.1 mmol/L   Chloride 104 98 - 111 mmol/L   CO2 24 22 - 32 mmol/L   Glucose, Bld 129 (H) 70 - 99 mg/dL    Comment: Glucose reference range applies only to samples taken after fasting for at least 8 hours.   BUN 18 6 - 20 mg/dL   Creatinine, Ser 8.92 (H) 0.44 - 1.00 mg/dL   Calcium  9.0 8.9 - 10.3 mg/dL   GFR, Estimated >39 >39 mL/min    Comment: (NOTE) Calculated using the CKD-EPI Creatinine Equation (2021)     Anion gap 10 5 - 15    Comment: Performed at [facility], [address] [place], [place] [address]   No results found.  Review Of Systems Constitutional: No fever, chills, positive chronic weight gain. Eyes: No vision change, wears glasses. No discharge or pain. Ears: No hearing loss, No tinnitus. Respiratory: No asthma, COPD, pneumonias. No shortness of breath. No hemoptysis. Cardiovascular: No chest pain, positive palpitation, leg edema. Gastrointestinal: No nausea, vomiting, positive diarrhea, no constipation. No GI bleed. No hepatitis. Genitourinary: No dysuria, hematuria, kidney stone. No incontinance. Neurological: No headache, stroke, seizures.  Psychiatry: No psych facility admission for anxiety, depression, suicide. No detox. Skin: No rash. Musculoskeletal: No joint pain, fibromyalgia. No neck pain, back pain. Lymphadenopathy: No lymphadenopathy. Hematology: Mild anemia, no easy bruising.   Blood pressure (!) 160/83, pulse (!) 153, temperature 98.3 F (36.8 C), temperature source Oral, resp. rate 19, height 5' 2 (1.575 m), weight 130 kg, SpO2 100%. Body mass index is 52.42 kg/m. General appearance: alert, cooperative, appears stated age and no distress Head: Normocephalic, atraumatic. Eyes: Hazel eyes, pink conjunctiva, corneas clear. Neck: No adenopathy, no carotid bruit, no JVD, supple, symmetrical, trachea midline and thyroid  not enlarged. Resp: Clear to auscultation bilaterally. Cardio: Regular rate and rhythm, S1, S2 normal, II/VI systolic murmur, no click, rub or gallop GI: Soft, non-tender; bowel sounds normal; no organomegaly. Extremities: No edema, cyanosis or clubbing. Skin: Warm and dry.  Neurologic: Alert and oriented X 3, normal strength. Normal coordination.  Assessment/Plan Paroxysmal atrial flutter with RVR HTN Morbid obesity Mild iron  deficiency anemia Caffeine intake disorder  Place in observation. Start low dose diltiazem   drip. Continue carvedilol  or consider Sotalol. Continue Eliquis . Patient reminded to give up caffeine drinks. Keep appointment for ablation.  Time spent: Review of old records, Lab, x-rays, EKG, other cardiac tests, examination, discussion with patient/Nurse/Doctor over 70 minutes.  Salena GORMAN Negri, MD  05/09/2024, 9:28 PM

## 2024-05-10 LAB — BASIC METABOLIC PANEL WITH GFR
Anion gap: 9 (ref 5–15)
BUN: 14 mg/dL (ref 6–20)
CO2: 24 mmol/L (ref 22–32)
Calcium: 8.8 mg/dL — ABNORMAL LOW (ref 8.9–10.3)
Chloride: 107 mmol/L (ref 98–111)
Creatinine, Ser: 0.9 mg/dL (ref 0.44–1.00)
GFR, Estimated: >60 mL/min (ref >60)
Glucose, Bld: 101 mg/dL — ABNORMAL HIGH (ref 70–99)
Potassium: 4.1 mmol/L (ref 3.5–5.1)
Sodium: 140 mmol/L (ref 135–145)

## 2024-05-10 LAB — LIPID PANEL
Cholesterol: 102 mg/dL (ref 0–200)
HDL: 30 mg/dL — ABNORMAL LOW (ref >40)
LDL Cholesterol: 51 mg/dL (ref 0–99)
Total CHOL/HDL Ratio: 3.4 ratio
Triglycerides: 103 mg/dL (ref <150)
VLDL: 21 mg/dL (ref 0–40)

## 2024-05-10 LAB — CBC
HCT: 41 % (ref 36.0–46.0)
Hemoglobin: 12.7 g/dL (ref 12.0–15.0)
MCH: 27.6 pg (ref 26.0–34.0)
MCHC: 31 g/dL (ref 30.0–36.0)
MCV: 89.1 fL (ref 80.0–100.0)
Platelets: 264 K/uL (ref 150–400)
RBC: 4.6 MIL/uL (ref 3.87–5.11)
RDW: 14.6 % (ref 11.5–15.5)
WBC: 10.8 K/uL — ABNORMAL HIGH (ref 4.0–10.5)
nRBC: 0 % (ref 0.0–0.2)

## 2024-05-10 MED ORDER — DILTIAZEM HCL 30 MG PO TABS: 30.0000 mg | ORAL_TABLET | Freq: Two times a day (BID) | ORAL | 3 refills | Status: AC

## 2024-05-10 MED ORDER — DILTIAZEM HCL 30 MG PO TABS: 30.0000 mg | ORAL_TABLET | Freq: Two times a day (BID) | ORAL | Status: DC

## 2024-05-10 NOTE — Discharge Summary (Signed)
 Physician Discharge Summary  Patient ID: Debbie Lee MRN: [id number] DOB/AGE: 1970/12/29 53 y.o.  Admit date: 05/09/2024 Discharge date: 05/10/2024  Admission Diagnoses: Paroxysmal atrial flutter with RVR HTN Morbid obesity Mild iron  deficiency anemia Caffeine intake disorder  Discharge Diagnoses:  Principal Problem:   Paroxysmal atrial flutter with RVR Active problems: HTN Morbid obesity Mild iron  deficiency anemia Caffeine intake disorder Type 2 DM  Discharged Condition: fair  Hospital Course: 53 years old white female with PMH of HTN, Panic attack, depression, anemia and arthritis has recurrent palpitations since 1830 yesterday. She was cardioverted x 2 without success. She is in sinus rhythm 1 minute after my examination, spontaneously. She denied chest pain.  She is scheduled for ablation in 10-14 days by her doctor at [facility]. She admits to caffeine intake as 1-2 cups of coffee and ice-tea with caffeine.  She has mild iron  deficiency anemia. She left AMA but I spoke to her by phone and she agrees to take low dose diltiazem , stop or drastically reduce caffeine use and see her cardiologist in 1 week or less.  Consults: cardiology  Significant Diagnostic Studies: labs: Near normal CBC, BMET and lipid panel with low HDL of 30 mg.  EKG: Atrial flutter with RVR followed by NSR.  CXR: No active disease with hypoinflated lungs.  Treatments: cardiac meds: Eliquis , Entresto , Jardiance , carvedilol  and diltiazem   Discharge Exam: Blood pressure 118/66, pulse (!) 51, temperature 97.7 F (36.5 C), temperature source Oral, resp. rate (!) 23, height 5' 2 (1.575 m), weight 130 kg, SpO2 100%. General appearance: alert, cooperative and appears stated age. Head: Normocephalic, atraumatic. Eyes: Blue eyes, pink conjunctiva, corneas clear.  Neck: No adenopathy, no carotid bruit, no JVD, supple, symmetrical, trachea midline and thyroid  not enlarged. Resp: Clear to  auscultation bilaterally. Cardio: Regular rate and rhythm, S1, S2 normal, II/VI systolic murmur, no click, rub or gallop. GI: Soft, non-tender; bowel sounds normal; no organomegaly. Extremities: No edema, cyanosis or clubbing. Skin: Warm and dry.  Neurologic: Alert and oriented X 3, normal strength and tone. Normal coordination.  Disposition:    Allergies as of 05/10/2024       Reactions   Codeine Other (See Comments)   Hallucinations. Narcotics cause vomiting.   Sulfa Antibiotics Other (See Comments)   lifelong allergy        Medication List     STOP taking these medications    furosemide  20 MG tablet Commonly known as: LASIX    metFORMIN  500 MG tablet Commonly known as: GLUCOPHAGE    metoprolol  succinate 50 MG 24 hr tablet Commonly known as: TOPROL -XL   spironolactone  25 MG tablet Commonly known as: ALDACTONE        TAKE these medications    albuterol  108 (90 Base) MCG/ACT inhaler Commonly known as: VENTOLIN  HFA Inhale 2 puffs into the lungs every 6 (six) hours as needed for wheezing or shortness of breath.   apixaban  5 MG Tabs tablet Commonly known as: ELIQUIS  Take 1 tablet (5 mg total) by mouth 2 (two) times daily.   carvedilol  12.5 MG tablet Commonly known as: COREG  Take 12.5 mg by mouth 2 (two) times daily.   diltiazem  30 MG tablet Commonly known as: CARDIZEM  Take 1 tablet (30 mg total) by mouth every 12 (twelve) hours.   Entresto  24-26 MG Generic drug: sacubitril -valsartan  Take 1 tablet by mouth 2 (two) times daily.   insulin  aspart 100 UNIT/ML FlexPen Commonly known as: NOVOLOG  CBG 70 - 120: 0 units  CBG 121 - 150: 1 unit  CBG 151 - 200: 2 units  CBG 201 - 250: 3 units  CBG 251 - 300: 5 units  CBG 301 - 350: 7 units  CBG 351 - 400: 9 units What changed:  how much to take how to take this when to take this reasons to take this   Jardiance  25 MG Tabs tablet Generic drug: empagliflozin  Take 25 mg by mouth daily.   levothyroxine  75  MCG tablet Commonly known as: SYNTHROID  Take 75 mcg by mouth at bedtime.   loperamide  2 MG tablet Commonly known as: IMODIUM  A-D Take 2 mg by mouth 4 (four) times daily as needed for diarrhea or loose stools.   NovoLIN  70/30 Kwikpen (70-30) 100 UNIT/ML KwikPen Generic drug: insulin  isophane & regular human KwikPen Inject 15 Units into the skin 2 (two) times daily. What changed: how much to take   omeprazole 20 MG capsule Commonly known as: PRILOSEC Take 20 mg by mouth daily as needed (indigestion).   ondansetron  4 MG tablet Commonly known as: ZOFRAN  Take 1 tablet (4 mg total) by mouth every 6 (six) hours as needed for nausea.   rosuvastatin  5 MG tablet Commonly known as: CRESTOR  Take 5 mg by mouth daily.   sertraline  50 MG tablet Commonly known as: ZOLOFT  Take 50 mg by mouth daily.   Vitamin D  (Ergocalciferol ) 1.25 MG (50000 UNIT) Caps capsule Commonly known as: DRISDOL  Take 50,000 Units by mouth once a week. Monday         Time spent: Review of old chart, current chart, lab, x-ray, cardiac tests and discussion with patient over 60 minutes.  Signed: Salena GORMAN Negri 05/10/2024, 8:47 AM

## 2024-05-10 NOTE — ED Notes (Signed)
 Phleb stuck twice, was unable to get blood

## 2024-05-11 LAB — LIPOPROTEIN A (LPA): Lipoprotein (a): 9.3 nmol/L (ref <75.0)

## 2024-05-13 ENCOUNTER — Other Ambulatory Visit: Payer: Self-pay

## 2024-05-13 ENCOUNTER — Emergency Department (HOSPITAL_COMMUNITY)
Admission: EM | Admit: 2024-05-13 | Discharge: 2024-05-13 | Discharge status: Against Medical Advice | Attending: Emergency Medicine | Admitting: Emergency Medicine

## 2024-05-13 ENCOUNTER — Encounter (HOSPITAL_COMMUNITY): Payer: Self-pay

## 2024-05-13 DIAGNOSIS — R197 Diarrhea, unspecified: Secondary | ICD-10-CM | POA: Insufficient documentation

## 2024-05-13 DIAGNOSIS — Z5321 Procedure and treatment not carried out due to patient leaving prior to being seen by health care provider: Secondary | ICD-10-CM | POA: Insufficient documentation

## 2024-05-13 DIAGNOSIS — R1110 Vomiting, unspecified: Secondary | ICD-10-CM | POA: Diagnosis not present

## 2024-05-13 LAB — COMPREHENSIVE METABOLIC PANEL WITH GFR
ALT: 17 U/L (ref 0–44)
AST: 18 U/L (ref 15–41)
Albumin: 3.9 g/dL (ref 3.5–5.0)
Alkaline Phosphatase: 62 U/L (ref 38–126)
Anion gap: 12 (ref 5–15)
BUN: 19 mg/dL (ref 6–20)
CO2: 25 mmol/L (ref 22–32)
Calcium: 9.7 mg/dL (ref 8.9–10.3)
Chloride: 104 mmol/L (ref 98–111)
Creatinine, Ser: 1.08 mg/dL — ABNORMAL HIGH (ref 0.44–1.00)
GFR, Estimated: >60 mL/min (ref >60)
Glucose, Bld: 115 mg/dL — ABNORMAL HIGH (ref 70–99)
Potassium: 3.9 mmol/L (ref 3.5–5.1)
Sodium: 141 mmol/L (ref 135–145)
Total Bilirubin: 1 mg/dL (ref 0.0–1.2)
Total Protein: 8.2 g/dL — ABNORMAL HIGH (ref 6.5–8.1)

## 2024-05-13 LAB — CBC WITH DIFFERENTIAL/PLATELET
Abs Immature Granulocytes: 0.05 K/uL (ref 0.00–0.07)
Basophils Absolute: 0 K/uL (ref 0.0–0.1)
Basophils Relative: 0 %
Eosinophils Absolute: 0.1 K/uL (ref 0.0–0.5)
Eosinophils Relative: 1 %
HCT: 47.5 % — ABNORMAL HIGH (ref 36.0–46.0)
Hemoglobin: 14.3 g/dL (ref 12.0–15.0)
Immature Granulocytes: 0 %
Lymphocytes Relative: 15 %
Lymphs Abs: 2.3 K/uL (ref 0.7–4.0)
MCH: 27 pg (ref 26.0–34.0)
MCHC: 30.1 g/dL (ref 30.0–36.0)
MCV: 89.6 fL (ref 80.0–100.0)
Monocytes Absolute: 0.6 K/uL (ref 0.1–1.0)
Monocytes Relative: 4 %
Neutro Abs: 11.7 K/uL — ABNORMAL HIGH (ref 1.7–7.7)
Neutrophils Relative %: 80 %
Platelets: 334 K/uL (ref 150–400)
RBC: 5.3 MIL/uL — ABNORMAL HIGH (ref 3.87–5.11)
RDW: 14.6 % (ref 11.5–15.5)
WBC: 14.8 K/uL — ABNORMAL HIGH (ref 4.0–10.5)
nRBC: 0 % (ref 0.0–0.2)

## 2024-05-13 LAB — LIPASE, BLOOD: Lipase: 40 U/L (ref 11–51)

## 2024-05-13 NOTE — ED Notes (Signed)
 Pt decided that she wanted to leave.

## 2024-05-13 NOTE — ED Triage Notes (Signed)
 Pt BIB EMS from work with reports of vomiting and diarrhea since today.

## 2024-06-05 DIAGNOSIS — E119 Type 2 diabetes mellitus without complications: Secondary | ICD-10-CM | POA: Diagnosis not present

## 2024-06-05 DIAGNOSIS — Z794 Long term (current) use of insulin: Secondary | ICD-10-CM | POA: Diagnosis not present

## 2024-09-05 DIAGNOSIS — G4730 Sleep apnea, unspecified: Secondary | ICD-10-CM | POA: Diagnosis not present

## 2024-09-11 DIAGNOSIS — E1165 Type 2 diabetes mellitus with hyperglycemia: Secondary | ICD-10-CM | POA: Diagnosis not present

## 2024-09-11 DIAGNOSIS — Z794 Long term (current) use of insulin: Secondary | ICD-10-CM | POA: Diagnosis not present

## 2024-09-13 DIAGNOSIS — F331 Major depressive disorder, recurrent, moderate: Secondary | ICD-10-CM | POA: Diagnosis not present
